# Patient Record
Sex: Male | Born: 1945
Health system: Southern US, Community
[De-identification: ages and names within clinical notes are randomized; demographics above are authoritative.]

## PROBLEM LIST (undated history)

## (undated) DIAGNOSIS — I499 Cardiac arrhythmia, unspecified: Secondary | ICD-10-CM

## (undated) DIAGNOSIS — D699 Hemorrhagic condition, unspecified: Secondary | ICD-10-CM

## (undated) DIAGNOSIS — Z860101 Personal history of adenomatous and serrated colon polyps: Secondary | ICD-10-CM

## (undated) DIAGNOSIS — R0602 Shortness of breath: Secondary | ICD-10-CM

## (undated) DIAGNOSIS — N189 Chronic kidney disease, unspecified: Secondary | ICD-10-CM

## (undated) DIAGNOSIS — R519 Headache, unspecified: Secondary | ICD-10-CM

## (undated) DIAGNOSIS — G4730 Sleep apnea, unspecified: Secondary | ICD-10-CM

## (undated) DIAGNOSIS — Z86010 Personal history of colonic polyps: Secondary | ICD-10-CM

## (undated) DIAGNOSIS — M1990 Unspecified osteoarthritis, unspecified site: Secondary | ICD-10-CM

## (undated) DIAGNOSIS — I350 Nonrheumatic aortic (valve) stenosis: Secondary | ICD-10-CM

## (undated) DIAGNOSIS — J189 Pneumonia, unspecified organism: Secondary | ICD-10-CM

## (undated) DIAGNOSIS — D649 Anemia, unspecified: Secondary | ICD-10-CM

## (undated) DIAGNOSIS — Z8719 Personal history of other diseases of the digestive system: Secondary | ICD-10-CM

## (undated) DIAGNOSIS — I5032 Chronic diastolic (congestive) heart failure: Secondary | ICD-10-CM

## (undated) DIAGNOSIS — K449 Diaphragmatic hernia without obstruction or gangrene: Secondary | ICD-10-CM

## (undated) DIAGNOSIS — H9190 Unspecified hearing loss, unspecified ear: Secondary | ICD-10-CM

## (undated) DIAGNOSIS — K219 Gastro-esophageal reflux disease without esophagitis: Secondary | ICD-10-CM

## (undated) DIAGNOSIS — Z7901 Long term (current) use of anticoagulants: Secondary | ICD-10-CM

## (undated) DIAGNOSIS — I4819 Other persistent atrial fibrillation: Secondary | ICD-10-CM

## (undated) DIAGNOSIS — R51 Headache: Secondary | ICD-10-CM

## (undated) DIAGNOSIS — I519 Heart disease, unspecified: Secondary | ICD-10-CM

## (undated) DIAGNOSIS — I10 Essential (primary) hypertension: Secondary | ICD-10-CM

## (undated) HISTORY — PX: COLONOSCOPY: SHX174

## (undated) HISTORY — PX: VOLVULUS REDUCTION: SHX425

## (undated) HISTORY — DX: Essential (primary) hypertension: I10

## (undated) HISTORY — PX: ESOPHAGUS SURGERY: SHX626

## (undated) HISTORY — DX: Cardiac arrhythmia, unspecified: I49.9

## (undated) HISTORY — DX: Anemia, unspecified: D64.9

## (undated) HISTORY — PX: TRANSTHORACIC ECHOCARDIOGRAM: SHX275

## (undated) HISTORY — DX: Hemorrhagic condition, unspecified: D69.9

## (undated) HISTORY — PX: FOOT SURGERY: SHX648

## (undated) HISTORY — PX: OTHER SURGICAL HISTORY: SHX169

## (undated) HISTORY — PX: ABDOMINAL HERNIA REPAIR: SHX539

## (undated) HISTORY — DX: Heart disease, unspecified: I51.9

---

## 1998-08-08 ENCOUNTER — Emergency Department (HOSPITAL_COMMUNITY): Admission: EM | Admit: 1998-08-08 | Discharge: 1998-08-08 | Payer: Self-pay | Admitting: Emergency Medicine

## 1998-08-19 ENCOUNTER — Ambulatory Visit (HOSPITAL_COMMUNITY): Admission: RE | Admit: 1998-08-19 | Discharge: 1998-08-19 | Payer: Self-pay | Admitting: Gastroenterology

## 1998-08-19 ENCOUNTER — Encounter: Payer: Self-pay | Admitting: Gastroenterology

## 1998-11-12 ENCOUNTER — Ambulatory Visit (HOSPITAL_COMMUNITY): Admission: RE | Admit: 1998-11-12 | Discharge: 1998-11-12 | Payer: Self-pay | Admitting: Internal Medicine

## 1998-12-30 ENCOUNTER — Ambulatory Visit (HOSPITAL_COMMUNITY): Admission: RE | Admit: 1998-12-30 | Discharge: 1998-12-30 | Payer: Self-pay | Admitting: Internal Medicine

## 1999-06-13 ENCOUNTER — Emergency Department (HOSPITAL_COMMUNITY): Admission: EM | Admit: 1999-06-13 | Discharge: 1999-06-13 | Payer: Self-pay | Admitting: Emergency Medicine

## 1999-12-22 ENCOUNTER — Encounter: Payer: Self-pay | Admitting: Orthopedic Surgery

## 1999-12-22 ENCOUNTER — Ambulatory Visit (HOSPITAL_BASED_OUTPATIENT_CLINIC_OR_DEPARTMENT_OTHER): Admission: RE | Admit: 1999-12-22 | Discharge: 1999-12-22 | Payer: Self-pay | Admitting: Orthopedic Surgery

## 2001-01-21 ENCOUNTER — Other Ambulatory Visit: Admission: RE | Admit: 2001-01-21 | Discharge: 2001-01-21 | Payer: Self-pay | Admitting: General Surgery

## 2001-03-26 ENCOUNTER — Encounter (INDEPENDENT_AMBULATORY_CARE_PROVIDER_SITE_OTHER): Payer: Self-pay | Admitting: Specialist

## 2001-03-26 ENCOUNTER — Other Ambulatory Visit: Admission: RE | Admit: 2001-03-26 | Discharge: 2001-03-26 | Payer: Self-pay | Admitting: Gastroenterology

## 2001-12-07 ENCOUNTER — Emergency Department (HOSPITAL_COMMUNITY): Admission: EM | Admit: 2001-12-07 | Discharge: 2001-12-07 | Payer: Self-pay | Admitting: *Deleted

## 2001-12-07 ENCOUNTER — Encounter: Payer: Self-pay | Admitting: *Deleted

## 2001-12-15 ENCOUNTER — Inpatient Hospital Stay (HOSPITAL_COMMUNITY): Admission: EM | Admit: 2001-12-15 | Discharge: 2001-12-18 | Payer: Self-pay

## 2003-01-12 ENCOUNTER — Other Ambulatory Visit: Admission: RE | Admit: 2003-01-12 | Discharge: 2003-01-12 | Payer: Self-pay | Admitting: Dermatology

## 2004-11-16 ENCOUNTER — Ambulatory Visit: Payer: Self-pay | Admitting: Cardiology

## 2004-12-13 ENCOUNTER — Ambulatory Visit (HOSPITAL_COMMUNITY): Admission: RE | Admit: 2004-12-13 | Discharge: 2004-12-13 | Payer: Self-pay | Admitting: Family Medicine

## 2004-12-26 ENCOUNTER — Ambulatory Visit: Payer: Self-pay | Admitting: *Deleted

## 2005-02-10 ENCOUNTER — Ambulatory Visit: Payer: Self-pay | Admitting: Cardiology

## 2005-03-22 ENCOUNTER — Ambulatory Visit: Payer: Self-pay | Admitting: Cardiology

## 2005-04-26 ENCOUNTER — Ambulatory Visit: Payer: Self-pay | Admitting: *Deleted

## 2005-06-01 ENCOUNTER — Ambulatory Visit: Payer: Self-pay | Admitting: *Deleted

## 2005-07-05 ENCOUNTER — Ambulatory Visit: Payer: Self-pay | Admitting: *Deleted

## 2005-07-24 ENCOUNTER — Emergency Department (HOSPITAL_COMMUNITY): Admission: EM | Admit: 2005-07-24 | Discharge: 2005-07-25 | Payer: Self-pay | Admitting: Emergency Medicine

## 2005-08-14 ENCOUNTER — Ambulatory Visit: Payer: Self-pay | Admitting: *Deleted

## 2005-09-14 ENCOUNTER — Ambulatory Visit: Payer: Self-pay | Admitting: *Deleted

## 2005-11-10 ENCOUNTER — Ambulatory Visit: Payer: Self-pay | Admitting: Cardiology

## 2005-12-18 ENCOUNTER — Ambulatory Visit: Payer: Self-pay | Admitting: Internal Medicine

## 2006-01-03 ENCOUNTER — Ambulatory Visit: Payer: Self-pay

## 2006-01-03 ENCOUNTER — Encounter: Payer: Self-pay | Admitting: Cardiology

## 2006-02-16 ENCOUNTER — Ambulatory Visit: Payer: Self-pay | Admitting: Internal Medicine

## 2006-04-20 ENCOUNTER — Ambulatory Visit: Payer: Self-pay | Admitting: *Deleted

## 2006-07-12 ENCOUNTER — Ambulatory Visit: Payer: Self-pay | Admitting: Cardiology

## 2006-09-10 ENCOUNTER — Ambulatory Visit: Payer: Self-pay | Admitting: Internal Medicine

## 2006-11-29 ENCOUNTER — Ambulatory Visit: Payer: Self-pay | Admitting: Cardiology

## 2007-02-15 ENCOUNTER — Ambulatory Visit: Payer: Self-pay | Admitting: Cardiology

## 2007-04-22 ENCOUNTER — Ambulatory Visit: Payer: Self-pay | Admitting: Internal Medicine

## 2007-05-07 ENCOUNTER — Ambulatory Visit: Payer: Self-pay | Admitting: Cardiology

## 2007-05-10 ENCOUNTER — Ambulatory Visit: Payer: Self-pay | Admitting: Internal Medicine

## 2007-06-05 ENCOUNTER — Ambulatory Visit: Payer: Self-pay | Admitting: Cardiovascular Disease

## 2007-07-10 ENCOUNTER — Ambulatory Visit: Payer: Self-pay | Admitting: Cardiology

## 2007-08-12 ENCOUNTER — Ambulatory Visit: Payer: Self-pay | Admitting: Internal Medicine

## 2007-09-16 ENCOUNTER — Ambulatory Visit: Payer: Self-pay | Admitting: Internal Medicine

## 2007-10-17 DIAGNOSIS — J189 Pneumonia, unspecified organism: Secondary | ICD-10-CM

## 2007-10-17 HISTORY — DX: Pneumonia, unspecified organism: J18.9

## 2007-11-06 ENCOUNTER — Ambulatory Visit: Payer: Self-pay | Admitting: Cardiology

## 2007-12-11 ENCOUNTER — Ambulatory Visit: Payer: Self-pay | Admitting: Cardiovascular Disease

## 2008-01-10 ENCOUNTER — Ambulatory Visit: Payer: Self-pay | Admitting: Cardiology

## 2008-02-12 ENCOUNTER — Ambulatory Visit: Payer: Self-pay | Admitting: Cardiology

## 2008-03-16 ENCOUNTER — Ambulatory Visit: Payer: Self-pay | Admitting: Cardiology

## 2008-04-22 ENCOUNTER — Ambulatory Visit: Payer: Self-pay | Admitting: Internal Medicine

## 2008-04-23 ENCOUNTER — Ambulatory Visit (HOSPITAL_COMMUNITY): Admission: RE | Admit: 2008-04-23 | Discharge: 2008-04-23 | Payer: Self-pay | Admitting: Internal Medicine

## 2008-04-24 ENCOUNTER — Ambulatory Visit: Payer: Self-pay | Admitting: Cardiology

## 2008-04-27 ENCOUNTER — Ambulatory Visit: Payer: Self-pay | Admitting: Cardiology

## 2008-04-30 ENCOUNTER — Ambulatory Visit: Payer: Self-pay | Admitting: Cardiology

## 2008-05-27 ENCOUNTER — Ambulatory Visit: Payer: Self-pay | Admitting: Cardiology

## 2008-07-01 ENCOUNTER — Ambulatory Visit: Payer: Self-pay | Admitting: Internal Medicine

## 2008-07-20 ENCOUNTER — Ambulatory Visit: Payer: Self-pay | Admitting: Cardiology

## 2008-08-31 ENCOUNTER — Ambulatory Visit: Payer: Self-pay | Admitting: Cardiology

## 2008-10-01 ENCOUNTER — Ambulatory Visit: Payer: Self-pay | Admitting: Cardiology

## 2008-11-09 ENCOUNTER — Ambulatory Visit: Payer: Self-pay | Admitting: Cardiology

## 2008-12-10 ENCOUNTER — Ambulatory Visit: Payer: Self-pay | Admitting: Cardiology

## 2009-01-11 ENCOUNTER — Ambulatory Visit: Payer: Self-pay | Admitting: Cardiology

## 2009-02-01 ENCOUNTER — Encounter (INDEPENDENT_AMBULATORY_CARE_PROVIDER_SITE_OTHER): Payer: Self-pay | Admitting: *Deleted

## 2009-02-25 ENCOUNTER — Encounter: Payer: Self-pay | Admitting: Cardiology

## 2009-03-01 ENCOUNTER — Ambulatory Visit: Payer: Self-pay | Admitting: Cardiology

## 2009-04-01 ENCOUNTER — Ambulatory Visit: Payer: Self-pay | Admitting: Cardiology

## 2009-05-06 ENCOUNTER — Ambulatory Visit: Payer: Self-pay | Admitting: Cardiology

## 2009-05-31 ENCOUNTER — Encounter: Payer: Self-pay | Admitting: *Deleted

## 2009-06-14 DIAGNOSIS — K222 Esophageal obstruction: Secondary | ICD-10-CM | POA: Insufficient documentation

## 2009-06-14 DIAGNOSIS — I10 Essential (primary) hypertension: Secondary | ICD-10-CM | POA: Insufficient documentation

## 2009-06-14 DIAGNOSIS — R42 Dizziness and giddiness: Secondary | ICD-10-CM | POA: Insufficient documentation

## 2009-07-05 ENCOUNTER — Ambulatory Visit: Payer: Self-pay | Admitting: Cardiology

## 2009-07-05 LAB — CONVERTED CEMR LAB: POC INR: 1.5

## 2009-07-22 ENCOUNTER — Ambulatory Visit: Payer: Self-pay | Admitting: Cardiology

## 2009-07-22 LAB — CONVERTED CEMR LAB: POC INR: 2.1

## 2009-08-19 ENCOUNTER — Ambulatory Visit: Payer: Self-pay | Admitting: Cardiology

## 2009-08-19 LAB — CONVERTED CEMR LAB: POC INR: 2.2

## 2009-09-01 ENCOUNTER — Ambulatory Visit: Payer: Self-pay | Admitting: Internal Medicine

## 2009-09-01 DIAGNOSIS — I4891 Unspecified atrial fibrillation: Secondary | ICD-10-CM | POA: Insufficient documentation

## 2009-09-27 ENCOUNTER — Ambulatory Visit: Payer: Self-pay | Admitting: Cardiology

## 2009-09-27 LAB — CONVERTED CEMR LAB: POC INR: 2.1

## 2009-10-28 ENCOUNTER — Telehealth: Payer: Self-pay | Admitting: Gastroenterology

## 2009-10-28 ENCOUNTER — Ambulatory Visit: Payer: Self-pay | Admitting: Cardiovascular Disease

## 2009-10-28 LAB — CONVERTED CEMR LAB: POC INR: 2

## 2009-11-29 ENCOUNTER — Ambulatory Visit: Payer: Self-pay | Admitting: Cardiology

## 2009-12-30 ENCOUNTER — Ambulatory Visit: Payer: Self-pay | Admitting: Cardiology

## 2009-12-30 LAB — CONVERTED CEMR LAB: POC INR: 2.9

## 2010-01-31 ENCOUNTER — Ambulatory Visit: Payer: Self-pay | Admitting: Cardiovascular Disease

## 2010-01-31 LAB — CONVERTED CEMR LAB: POC INR: 2.9

## 2010-03-03 ENCOUNTER — Ambulatory Visit: Payer: Self-pay | Admitting: Cardiology

## 2010-03-03 LAB — CONVERTED CEMR LAB: POC INR: 2.8

## 2010-03-18 ENCOUNTER — Ambulatory Visit: Payer: Self-pay | Admitting: Internal Medicine

## 2010-04-05 ENCOUNTER — Encounter (INDEPENDENT_AMBULATORY_CARE_PROVIDER_SITE_OTHER): Payer: Self-pay | Admitting: *Deleted

## 2010-04-14 ENCOUNTER — Encounter (INDEPENDENT_AMBULATORY_CARE_PROVIDER_SITE_OTHER): Payer: Self-pay | Admitting: Pharmacist

## 2010-04-27 ENCOUNTER — Ambulatory Visit (HOSPITAL_COMMUNITY)
Admission: RE | Admit: 2010-04-27 | Discharge: 2010-04-29 | Payer: Self-pay | Source: Home / Self Care | Admitting: Orthopedic Surgery

## 2010-05-11 ENCOUNTER — Ambulatory Visit: Payer: Self-pay | Admitting: Cardiology

## 2010-06-08 ENCOUNTER — Ambulatory Visit: Payer: Self-pay | Admitting: Cardiology

## 2010-06-16 ENCOUNTER — Ambulatory Visit: Payer: Self-pay | Admitting: Cardiology

## 2010-07-11 ENCOUNTER — Ambulatory Visit: Payer: Self-pay | Admitting: Cardiology

## 2010-08-08 ENCOUNTER — Ambulatory Visit: Payer: Self-pay | Admitting: Cardiology

## 2010-09-05 ENCOUNTER — Ambulatory Visit: Payer: Self-pay | Admitting: Cardiology

## 2010-09-05 LAB — CONVERTED CEMR LAB: POC INR: 2.9

## 2010-10-03 ENCOUNTER — Ambulatory Visit: Payer: Self-pay | Admitting: Cardiology

## 2010-10-20 ENCOUNTER — Ambulatory Visit
Admission: RE | Admit: 2010-10-20 | Discharge: 2010-10-20 | Payer: Self-pay | Source: Home / Self Care | Attending: Cardiology | Admitting: Cardiology

## 2010-10-20 DIAGNOSIS — R0602 Shortness of breath: Secondary | ICD-10-CM | POA: Insufficient documentation

## 2010-10-21 ENCOUNTER — Encounter: Payer: Self-pay | Admitting: Adult Health

## 2010-10-21 ENCOUNTER — Ambulatory Visit (HOSPITAL_COMMUNITY)
Admission: RE | Admit: 2010-10-21 | Discharge: 2010-10-21 | Payer: Self-pay | Source: Home / Self Care | Attending: Cardiology | Admitting: Cardiology

## 2010-10-21 ENCOUNTER — Encounter: Payer: Self-pay | Admitting: Cardiology

## 2010-10-21 LAB — CONVERTED CEMR LAB
BUN: 12 mg/dL (ref 6–23)
Calcium: 9 mg/dL (ref 8.4–10.5)
Chloride: 106 meq/L (ref 96–112)
Creatinine, Ser: 0.79 mg/dL (ref 0.40–1.50)

## 2010-10-24 ENCOUNTER — Encounter (INDEPENDENT_AMBULATORY_CARE_PROVIDER_SITE_OTHER): Payer: Self-pay | Admitting: *Deleted

## 2010-10-27 ENCOUNTER — Encounter: Payer: Self-pay | Admitting: Adult Health

## 2010-11-02 ENCOUNTER — Encounter: Payer: Self-pay | Admitting: Cardiology

## 2010-11-03 ENCOUNTER — Ambulatory Visit: Admission: RE | Admit: 2010-11-03 | Discharge: 2010-11-03 | Payer: Self-pay | Source: Home / Self Care

## 2010-11-15 NOTE — Letter (Signed)
Summary: Appointment - Missed  [facility] [place]  [address] [place] [address]   Phone: [phone]  Fax: [phone]     April 05, 2010 MRN: [id number]   Brent Mason [address] [place], [place]  [address]   Dear Brent Mason,  Our records indicate you missed your appointment on          04/05/10              with Dr.       .        Ladona Ridgel                            It is very important that we reach you to reschedule this appointment. We look forward to participating in your health care needs. Please contact us at the number listed above at your earliest convenience to reschedule this appointment.     Sincerely,    Glass blower/designer

## 2010-11-15 NOTE — Medication Information (Signed)
Summary: ccr-lr  Anticoagulant Therapy  Managed by: Vashti Hey, RN PCP: Dr.Fusco Supervising MD: Diona Browner MD, Remi Deter Indication 1: Atrial Fibrillation (ICD-427.31) Lab Used: [facility] [place] Site: [place] INR POC 4.4  Dietary changes: no    Health status changes: no    Bleeding/hemorrhagic complications: no    Recent/future hospitalizations: no    Any changes in medication regimen? yes       Details: Has been on Doxycycline 100mg  BID since July.  Has 25 days left  Recent/future dental: no  Any missed doses?: no       Is patient compliant with meds? yes       Allergies: No Known Drug Allergies  Anticoagulation Management History:      The patient is taking warfarin and comes in today for a routine follow up visit.  Negative risk factors for bleeding include an age less than 28 years old.  The bleeding index is 'low risk'.  Positive CHADS2 values include History of HTN.  Negative CHADS2 values include Age > 22 years old.  The start date was 09/15/1998.  Anticoagulation responsible provider: Diona Browner MD, Remi Deter.  INR POC: 4.4.  Cuvette Lot#: [id number].  Exp: 10/11.    Anticoagulation Management Assessment/Plan:      The patient's current anticoagulation dose is Coumadin 5 mg tabs: as directed.  The target INR is 2 - 3.  The next INR is due 06/16/2010.  Anticoagulation instructions were given to patient.  Results were reviewed/authorized by Vashti Hey, RN.  He was notified by Vashti Hey RN.         Prior Anticoagulation Instructions: INR 2.8 Continue coumadin 5mg  once daily except 7.5mg  on Tuesdays and Thursdays  Current Anticoagulation Instructions: INR 4.4 Is on Doxycycline 100mg  two times a day   Has 25 days left Hold coumadin Thursday and Friday then decrease dose to 5mg  once daily and recheck INR on 06/16/10

## 2010-11-15 NOTE — Medication Information (Signed)
Summary: ccr-lr  Anticoagulant Therapy  Managed by: Vashti Hey, RN PCP: Dr.Fusco Supervising MD: Dietrich Pates MD, Molly Maduro Indication 1: Atrial Fibrillation (ICD-427.31) Lab Used: [facility] [place] Site: [place] INR POC 2.9  Dietary changes: no    Health status changes: no    Bleeding/hemorrhagic complications: no    Recent/future hospitalizations: no    Any changes in medication regimen? no    Recent/future dental: no  Any missed doses?: no       Is patient compliant with meds? yes       Allergies: No Known Drug Allergies  Anticoagulation Management History:      The patient is taking warfarin and comes in today for a routine follow up visit.  Positive risk factors for bleeding include an age of 66 years or older.  The bleeding index is 'intermediate risk'.  Positive CHADS2 values include History of HTN.  Negative CHADS2 values include Age > 61 years old.  The start date was 09/15/1998.  Anticoagulation responsible Cayson Kalb: Dietrich Pates MD, Molly Maduro.  INR POC: 2.9.  Cuvette Lot#: [id number].  Exp: 10/11.    Anticoagulation Management Assessment/Plan:      The patient's current anticoagulation dose is Coumadin 5 mg tabs: as directed.  The target INR is 2 - 3.  The next INR is due 10/03/2010.  Anticoagulation instructions were given to patient.  Results were reviewed/authorized by Vashti Hey, RN.  He was notified by Vashti Hey RN.         Prior Anticoagulation Instructions: INR 3.5 Decrease dose to 5mg  once daily   Current Anticoagulation Instructions: INR 2.9 Continue coumadin 5mg  once daily

## 2010-11-15 NOTE — Medication Information (Signed)
Summary: ccr-lr  Anticoagulant Therapy  Managed by: Vashti Hey, RN PCP: Dr.Fusco Supervising MD: Dietrich Pates MD, Molly Maduro Indication 1: Atrial Fibrillation (ICD-427.31) Lab Used: [facility] [place] Site: [place] INR POC 3.8  Dietary changes: no    Health status changes: no    Bleeding/hemorrhagic complications: no    Recent/future hospitalizations: no    Any changes in medication regimen? no    Recent/future dental: no  Any missed doses?: no       Is patient compliant with meds? yes       Allergies: No Known Drug Allergies  Anticoagulation Management History:      The patient is taking warfarin and comes in today for a routine follow up visit.  Positive risk factors for bleeding include an age of 66 years or older.  The bleeding index is 'intermediate risk'.  Positive CHADS2 values include History of HTN.  Negative CHADS2 values include Age > 58 years old.  The start date was 09/15/1998.  Anticoagulation responsible provider: Dietrich Pates MD, Molly Maduro.  INR POC: 3.8.  Cuvette Lot#: [id number].  Exp: 10/11.    Anticoagulation Management Assessment/Plan:      The patient's current anticoagulation dose is Coumadin 5 mg tabs: as directed.  The target INR is 2 - 3.  The next INR is due 08/08/2010.  Anticoagulation instructions were given to patient.  Results were reviewed/authorized by Vashti Hey, RN.  He was notified by Vashti Hey RN.         Prior Anticoagulation Instructions: INR 3.3 Decrease coumadin to 5mg  once daily except 2.5mg  on Fridays until you finish Doxycycline then resume 5mg  once daily except 7.5mg  on Tuesdays and Thursdays.  Current Anticoagulation Instructions: INR 3.8 Hold coumadin tomorrow then decrease dose to 5mg  once daily except 7.5mg  on Thursdays Pt requested 1 month

## 2010-11-15 NOTE — Medication Information (Signed)
Summary: ccr-lr  Anticoagulant Therapy  Managed by: Vashti Hey, RN PCP: Dr.Fusco Supervising MD: Dietrich Pates MD, Molly Maduro Indication 1: Atrial Fibrillation (ICD-427.31) Lab Used: [facility] [place] Site: [place] INR POC 2.8  Dietary changes: no    Health status changes: no    Bleeding/hemorrhagic complications: no    Recent/future hospitalizations: no    Any changes in medication regimen? no    Recent/future dental: no  Any missed doses?: no       Is patient compliant with meds? yes       Allergies: No Known Drug Allergies  Anticoagulation Management History:      The patient is taking warfarin and comes in today for a routine follow up visit.  Negative risk factors for bleeding include an age less than 18 years old.  The bleeding index is 'low risk'.  Positive CHADS2 values include History of HTN.  Negative CHADS2 values include Age > 24 years old.  The start date was 09/15/1998.  Anticoagulation responsible provider: Dietrich Pates MD, Molly Maduro.  INR POC: 2.8.  Cuvette Lot#: [id number].  Exp: 10/11.    Anticoagulation Management Assessment/Plan:      The patient's current anticoagulation dose is Coumadin 5 mg tabs: as directed.  The target INR is 2 - 3.  The next INR is due 04/04/2010.  Anticoagulation instructions were given to patient.  Results were reviewed/authorized by Vashti Hey, RN.  He was notified by Vashti Hey RN.         Prior Anticoagulation Instructions: INR 2.9 Continue coumadin 5mg  once daily except 7.5mg  on Tuesdays and Thursdays  Current Anticoagulation Instructions: INR 2.8 Continue coumadin 5mg  once daily except 7.5mg  on Tuesdays and Thursdays

## 2010-11-15 NOTE — Medication Information (Signed)
Summary: ccr-lr  Anticoagulant Therapy  Managed by: Vashti Hey, RN PCP: Dr.Fusco Supervising MD: Eden Emms MD, Theron Arista Indication 1: Atrial Fibrillation (ICD-427.31) Lab Used: [facility]  Site: [place] INR POC 2.0  Dietary changes: no    Health status changes: no    Bleeding/hemorrhagic complications: no    Recent/future hospitalizations: no    Any changes in medication regimen? no    Recent/future dental: no  Any missed doses?: no       Is patient compliant with meds? yes       Allergies: No Known Drug Allergies  Anticoagulation Management History:      The patient is taking warfarin and comes in today for a routine follow up visit.  Negative risk factors for bleeding include an age less than 38 years old.  The bleeding index is 'low risk'.  Positive CHADS2 values include History of HTN.  Negative CHADS2 values include Age > 87 years old.  The start date was 09/15/1998.  Anticoagulation responsible provider: Eden Emms MD, Theron Arista.  INR POC: 2.0.  Cuvette Lot#: [id number].  Exp: 10/11.    Anticoagulation Management Assessment/Plan:      The patient's current anticoagulation dose is Coumadin 5 mg tabs: as directed.  The target INR is 2 - 3.  The next INR is due 11/25/2009.  Anticoagulation instructions were given to patient.  Results were reviewed/authorized by Vashti Hey, RN.  He was notified by Vashti Hey RN.         Prior Anticoagulation Instructions: INR 2.1 Continue coumadin 5mg  once daily except 2.5mg  on Tuesdays  Current Anticoagulation Instructions: INR 2.0 Increase coumadin to 5mg  once daily

## 2010-11-15 NOTE — Letter (Signed)
Summary: Custom - Delinquent Coumadin 1  [facility] [place]  [address], [place] [address]   Phone: [phone]  Fax: [phone]     April 14, 2010 MRN: [id number]   [name] [address] [place], [place]  [address]   Dear [name],  This letter is being sent to you as a reminder that it is necessary for you to get your INR/PT checked regularly so that we can optimize your care.  Our records indicate that you were scheduled to have a test done recently.  As of today, we have not received the results of this test.  It is very important that you have your INR checked.  Please call our office at the number listed above to schedule an appointment at your earliest convenience.    If you have recently had your protime checked or have discontinued this medication, please contact our office at the above phone number to clarify this issue.  Thank you for this prompt attention to this important health care matter.  Sincerely, Vashti Hey RN  [facility] Team

## 2010-11-15 NOTE — Medication Information (Signed)
Summary: ccr-lr  Anticoagulant Therapy  Managed by: Vashti Hey, RN PCP: Dr.Fusco Supervising MD: Diona Browner MD, Remi Deter Indication 1: Atrial Fibrillation (ICD-427.31) Lab Used: [facility] [place] Site: [place] INR POC 3.3  Dietary changes: no    Health status changes: no    Bleeding/hemorrhagic complications: no    Recent/future hospitalizations: no    Any changes in medication regimen? no    Recent/future dental: no  Any missed doses?: no       Is patient compliant with meds? yes       Allergies: No Known Drug Allergies  Anticoagulation Management History:      The patient is taking warfarin and comes in today for a routine follow up visit.  Negative risk factors for bleeding include an age less than 76 years old.  The bleeding index is 'low risk'.  Positive CHADS2 values include History of HTN.  Negative CHADS2 values include Age > 32 years old.  The start date was 09/15/1998.  Anticoagulation responsible provider: Diona Browner MD, Remi Deter.  INR POC: 3.3.  Cuvette Lot#: [id number].  Exp: 10/11.    Anticoagulation Management Assessment/Plan:      The patient's current anticoagulation dose is Coumadin 5 mg tabs: as directed.  The target INR is 2 - 3.  The next INR is due 07/11/2010.  Anticoagulation instructions were given to patient.  Results were reviewed/authorized by Vashti Hey, RN.  He was notified by Vashti Hey RN.         Prior Anticoagulation Instructions: INR 4.4 Is on Doxycycline 100mg  two times a day   Has 25 days left Hold coumadin Thursday and Friday then decrease dose to 5mg  once daily and recheck INR on 06/16/10  Current Anticoagulation Instructions: INR 3.3 Decrease coumadin to 5mg  once daily except 2.5mg  on Fridays until you finish Doxycycline then resume 5mg  once daily except 7.5mg  on Tuesdays and Thursdays.

## 2010-11-15 NOTE — Progress Notes (Signed)
Summary: Change GI Dr/No need to Schedule Colon   Phone Note Outgoing Call   Call placed by: Hortense Ramal CMA Duncan Dull),  October 28, 2009 11:47 AM Call placed to: Patient Summary of Call: I have called patient to advise him that he needs to schedule his recall colonoscopy. Patient states that he had a colonoscopy done in [place] last year and is seeing a GI Dr there. Initial call taken by: Hortense Ramal CMA Duncan Dull),  October 28, 2009 11:48 AM

## 2010-11-15 NOTE — Medication Information (Signed)
Summary: ccr-lr  Anticoagulant Therapy  Managed by: Vashti Hey, RN PCP: Dr.Fusco Supervising MD: Dietrich Pates MD, Molly Maduro Indication 1: Atrial Fibrillation (ICD-427.31) Lab Used: [facility] [place] Site: [place] INR POC 1.8  Dietary changes: no    Health status changes: no    Bleeding/hemorrhagic complications: no    Recent/future hospitalizations: no    Any changes in medication regimen? no    Recent/future dental: no  Any missed doses?: no       Is patient compliant with meds? yes       Allergies: No Known Drug Allergies  Anticoagulation Management History:      The patient is taking warfarin and comes in today for a routine follow up visit.  Negative risk factors for bleeding include an age less than 15 years old.  The bleeding index is 'low risk'.  Positive CHADS2 values include History of HTN.  Negative CHADS2 values include Age > 63 years old.  The start date was 09/15/1998.  Anticoagulation responsible provider: Dietrich Pates MD, Molly Maduro.  INR POC: 1.8.  Cuvette Lot#: [id number].  Exp: 10/11.    Anticoagulation Management Assessment/Plan:      The patient's current anticoagulation dose is Coumadin 5 mg tabs: as directed.  The target INR is 2 - 3.  The next INR is due 12/30/2009.  Anticoagulation instructions were given to patient.  Results were reviewed/authorized by Vashti Hey, RN.  He was notified by Vashti Hey RN.         Prior Anticoagulation Instructions: INR 2.0 Increase coumadin to 5mg  once daily   Current Anticoagulation Instructions: INR 1.8 Increase coumadin to 5mg  once daily except 7.5mg  on Tuesdays and Thursdays. (Take 7.5mg  tonight instead of Tuesday night this week)

## 2010-11-15 NOTE — Medication Information (Signed)
Summary: ccr-lr  Anticoagulant Therapy  Managed by: Vashti Hey, RN PCP: Dr.Fusco Supervising MD: Daleen Squibb MD, Maisie Fus Indication 1: Atrial Fibrillation (ICD-427.31) Lab Used: [facility] [place] Site: [place] INR POC 3.5  Dietary changes: no    Health status changes: no    Bleeding/hemorrhagic complications: no    Recent/future hospitalizations: no    Any changes in medication regimen? no    Recent/future dental: no  Any missed doses?: no       Is patient compliant with meds? yes       Allergies: No Known Drug Allergies  Anticoagulation Management History:      The patient is taking warfarin and comes in today for a routine follow up visit.  Positive risk factors for bleeding include an age of 66 years or older.  The bleeding index is 'intermediate risk'.  Positive CHADS2 values include History of HTN.  Negative CHADS2 values include Age > 26 years old.  The start date was 09/15/1998.  Anticoagulation responsible provider: Daleen Squibb MD, Maisie Fus.  INR POC: 3.5.  Cuvette Lot#: [id number].  Exp: 10/11.    Anticoagulation Management Assessment/Plan:      The patient's current anticoagulation dose is Coumadin 5 mg tabs: as directed.  The target INR is 2 - 3.  The next INR is due 09/05/2010.  Anticoagulation instructions were given to patient.  Results were reviewed/authorized by Vashti Hey, RN.  He was notified by Vashti Hey RN.         Prior Anticoagulation Instructions: INR 3.8 Hold coumadin tomorrow then decrease dose to 5mg  once daily except 7.5mg  on Thursdays Pt requested 1 month  Current Anticoagulation Instructions: INR 3.5 Decrease dose to 5mg  once daily

## 2010-11-15 NOTE — Medication Information (Signed)
Summary: protime/tg  Anticoagulant Therapy  Managed by: Vashti Hey, RN PCP: Dr.Fusco Supervising MD: Dietrich Pates MD, Molly Maduro Indication 1: Atrial Fibrillation (ICD-427.31) Lab Used: [facility] [place] Site:  INR POC 2.8  Dietary changes: no    Health status changes: no    Bleeding/hemorrhagic complications: no    Recent/future hospitalizations: yes       Details: In [facility] 7/13 for foot surgery  D/C 7/15 Resumed coumadin on 04/28/10  Any changes in medication regimen? no    Recent/future dental: no  Any missed doses?: yes     Details: Was off coumadin 6 days prior to procedure  Is patient compliant with meds? yes       Allergies: No Known Drug Allergies  Anticoagulation Management History:      The patient is taking warfarin and comes in today for a routine follow up visit.  Negative risk factors for bleeding include an age less than 57 years old.  The bleeding index is 'low risk'.  Positive CHADS2 values include History of HTN.  Negative CHADS2 values include Age > 56 years old.  The start date was 09/15/1998.  Anticoagulation responsible provider: Dietrich Pates MD, Molly Maduro.  INR POC: 2.8.  Cuvette Lot#: [id number].  Exp: 10/11.    Anticoagulation Management Assessment/Plan:      The patient's current anticoagulation dose is Coumadin 5 mg tabs: as directed.  The target INR is 2 - 3.  The next INR is due 06/08/2010.  Anticoagulation instructions were given to patient.  Results were reviewed/authorized by Vashti Hey, RN.  He was notified by Vashti Hey RN.         Prior Anticoagulation Instructions: INR 2.8 Continue coumadin 5mg  once daily except 7.5mg  on Tuesdays and Thursdays  Current Anticoagulation Instructions: Same as Prior Instructions.

## 2010-11-15 NOTE — Medication Information (Signed)
Summary: ccr-lr  Anticoagulant Therapy  Managed by: Vashti Hey, RN PCP: Dr.Fusco Supervising MD: Dietrich Pates MD, Molly Maduro Indication 1: Atrial Fibrillation (ICD-427.31) Lab Used: [facility] [place] Site:  INR POC 2.9  Dietary changes: no    Health status changes: no    Bleeding/hemorrhagic complications: no    Recent/future hospitalizations: no    Any changes in medication regimen? no    Recent/future dental: no  Any missed doses?: no       Is patient compliant with meds? yes       Allergies: No Known Drug Allergies  Anticoagulation Management History:      The patient is taking warfarin and comes in today for a routine follow up visit.  Negative risk factors for bleeding include an age less than 34 years old.  The bleeding index is 'low risk'.  Positive CHADS2 values include History of HTN.  Negative CHADS2 values include Age > 45 years old.  The start date was 09/15/1998.  Anticoagulation responsible provider: Dietrich Pates MD, Molly Maduro.  INR POC: 2.9.  Cuvette Lot#: [id number].  Exp: 10/11.    Anticoagulation Management Assessment/Plan:      The patient's current anticoagulation dose is Coumadin 5 mg tabs: as directed.  The target INR is 2 - 3.  The next INR is due 01/31/2010.  Anticoagulation instructions were given to patient.  Results were reviewed/authorized by Vashti Hey, RN.  He was notified by Vashti Hey RN.         Prior Anticoagulation Instructions: INR 1.8 Increase coumadin to 5mg  once daily except 7.5mg  on Tuesdays and Thursdays. (Take 7.5mg  tonight instead of Tuesday night this week)  Current Anticoagulation Instructions: INR 2.9 Continue coumadin 5mg  once daily except 7.5mg  on Tuesdays and Thursdays

## 2010-11-15 NOTE — Medication Information (Signed)
Summary: ccr-lr  Anticoagulant Therapy  Managed by: Vashti Hey, RN PCP: Dr.Fusco Supervising MD: Eden Emms MD, Theron Arista Indication 1: Atrial Fibrillation (ICD-427.31) Lab Used: [facility] [place] Site:  INR POC 2.9  Dietary changes: no    Health status changes: no    Bleeding/hemorrhagic complications: no    Recent/future hospitalizations: no    Any changes in medication regimen? no    Recent/future dental: no  Any missed doses?: no       Is patient compliant with meds? yes       Allergies: No Known Drug Allergies  Anticoagulation Management History:      The patient is taking warfarin and comes in today for a routine follow up visit.  Negative risk factors for bleeding include an age less than 65 years old.  The bleeding index is 'low risk'.  Positive CHADS2 values include History of HTN.  Negative CHADS2 values include Age > 37 years old.  The start date was 09/15/1998.  Anticoagulation responsible provider: Eden Emms MD, Theron Arista.  INR POC: 2.9.  Cuvette Lot#: [id number].  Exp: 10/11.    Anticoagulation Management Assessment/Plan:      The patient's current anticoagulation dose is Coumadin 5 mg tabs: as directed.  The target INR is 2 - 3.  The next INR is due 03/03/2010.  Anticoagulation instructions were given to patient.  Results were reviewed/authorized by Vashti Hey, RN.  He was notified by Vashti Hey RN.         Prior Anticoagulation Instructions: INR 2.9 Continue coumadin 5mg  once daily except 7.5mg  on Tuesdays and Thursdays  Current Anticoagulation Instructions: Same as Prior Instructions.

## 2010-11-15 NOTE — Assessment & Plan Note (Signed)
Summary: rov  Medications Added [facility] IRON 325 (65 FE) MG TABS (FERROUS SULFATE) once daily NAPROSYN 500 MG TABS (NAPROXEN) as needed      Allergies Added: NKDA  Visit Type:  Follow-up Primary Provider:  Dr.Fusco   History of Present Illness: Brent Mason returns today for followup of his atrial fibrillation and HTN.  He is a pleasant middle aged man with obesity, sleep apnea, atrial fibrillation (chronic) and HTN.  He has done well in the past year.  He denies c/p, sob, or peripheral edema.  He continues to work 40 hrs. a week.  No syncope. He has been unable to lose weight.  He notes that he is pending ankle surgery.  No other complaints.  Current Medications (verified): 1)  Protonix 40 Mg Tbec (Pantoprazole Sodium) .... Take 1 Tab Daily 2)  Coumadin 5 Mg Tabs (Warfarin Sodium) .... As Directed 3)  Captopril 12.5 Mg Tabs (Captopril) .... Take 1 Tab Two Times A Day 4)  [facility] Iron 325 (65 Fe) Mg Tabs (Ferrous Sulfate) .... Once Daily 5)  Naprosyn 500 Mg Tabs (Naproxen) .... As Needed  Allergies (verified): No Known Drug Allergies  Past History:  Past Medical History: Last updated: 06/14/2009 Current Problems:  DIZZINESS (ICD-780.4) ESOPHAGEAL STRICTURE (ICD-530.3) HYPERTENSION (ICD-401.9)  Past Surgical History: Last updated: 06/14/2009 history of volvulus status post surgery 1998 and 1999  Review of Systems  The patient denies chest pain, syncope, dyspnea on exertion, and peripheral edema.    Vital Signs:  Patient profile:   65 year old male Height:      72 inches Weight:      290 pounds BMI:     39.47 Pulse rate:   73 / minute BP sitting:   132 / 88  (left arm)  Vitals Entered By: Brent Mason CMA (March 18, 2010 4:02 PM)  Physical Exam  General:  Obese, middle aged well developed, well nourished, in no acute distress.  HEENT: normal Neck: supple. No JVD. Carotids 2+ bilaterally no bruits Cor:I RIRR no rubs, gallops or murmur Lungs: CTA Ab: soft, nontender.  nondistended. No HSM. Good bowel sounds Ext: warm. no cyanosis, clubbing or edema. right foot in a boot. Neuro: alert and oriented. Grossly nonfocal. affect pleasant    EKG  Procedure date:  03/18/2010  Findings:      Atrial fibrillation with a controlled ventricular response rate of: 73.  Impression & Recommendations:  Problem # 1:  ATRIAL FIBRILLATION (ICD-427.31) His ventricular rate appears to  be well controlled without AV nodal blocking drugs.  Continue current meds. His updated medication list for this problem includes:    Coumadin 5 Mg Tabs (Warfarin sodium) .Marland Kitchen... As directed  Problem # 2:  HYPERTENSION (ICD-401.9) His blood pressure is well controlled.  A low sodium diet is requested. His updated medication list for this problem includes:    Captopril 12.5 Mg Tabs (Captopril) .Marland Kitchen... Take 1 tab two times a day  Problem # 3:  MORBID OBESITY (ICD-278.01) A low fat diet is recommended.  Problem # 4:  PREOPERATIVE EXAMINATION (ICD-V72.84) The patient is at low risk for major cardiovascular complications from pending foot surgery.  Coumadin may be stopped 5 days prior to surgery and restarted on post op day 1.  Patient Instructions: 1)  Your physician recommends that you schedule a follow-up appointment in: 12 months with Dr Ladona Ridgel 2)  Your physician recommends that you continue on your current medications as directed. Please refer to the Current Medication list given  to you today.

## 2010-11-17 NOTE — Letter (Signed)
Summary: [place] Results Engineer, agricultural at [facility]  [address] [place] [address]   Phone: [phone]  Fax: [phone]      October 24, 2010 MRN: [id number]   GOEBEL HELLUMS [address] [place], [place]  [address]   Dear Mr. PITERA,  Your test ordered by Selena Batten has been reviewed by your physician (or physician assistant) and was found to be normal or stable. Your physician (or physician assistant) felt no changes were needed at this time.  ____ Echocardiogram  ____ Cardiac Stress Test  __X__ Lab Work  ____ Peripheral vascular study of arms, legs or neck  ____ CT scan or X-ray  ____ Lung or Breathing test  ____ Other:  No change in medical treatment at this time, per Joni Reining NP.   Thank you, Rashaad Hallstrom Allyne Gee RN    [place] Bing, MD, Lenise Arena.C.Gaylord Shih, MD, F.A.C.C Lewayne Bunting, MD, F.A.C.C Nona Dell, MD, F.A.C.C Charlton Haws, MD, Lenise Arena.C.C

## 2010-11-17 NOTE — Medication Information (Signed)
Summary: ccr-lr  Anticoagulant Therapy  Managed by: Vashti Hey, RN PCP: Dr.Fusco Supervising MD: Dietrich Pates MD, Molly Maduro Indication 1: Atrial Fibrillation (ICD-427.31) Lab Used: [facility] [place] Site: [place] INR POC 2.6  Dietary changes: no    Health status changes: no    Bleeding/hemorrhagic complications: no    Recent/future hospitalizations: no    Any changes in medication regimen? no    Recent/future dental: no  Any missed doses?: no       Is patient compliant with meds? yes       Allergies: No Known Drug Allergies  Anticoagulation Management History:      The patient is taking warfarin and comes in today for a routine follow up visit.  Positive risk factors for bleeding include an age of 66 years or older.  The bleeding index is 'intermediate risk'.  Positive CHADS2 values include History of HTN.  Negative CHADS2 values include Age > 33 years old.  The start date was 09/15/1998.  Anticoagulation responsible provider: Dietrich Pates MD, Molly Maduro.  INR POC: 2.6.  Cuvette Lot#: [id number].  Exp: 10/11.    Anticoagulation Management Assessment/Plan:      The patient's current anticoagulation dose is Coumadin 5 mg tabs: as directed.  The target INR is 2 - 3.  The next INR is due 11/03/2010.  Anticoagulation instructions were given to patient.  Results were reviewed/authorized by Vashti Hey, RN.  He was notified by Vashti Hey RN.         Prior Anticoagulation Instructions: INR 2.9 Continue coumadin 5mg  once daily   Current Anticoagulation Instructions: INR 2.6 Continue coumadin 5mg  once daily

## 2010-11-17 NOTE — Letter (Signed)
Summary: [facility]   Imported By: Faythe Ghee 10/27/2010 10:46:18  _____________________________________________________________________  External Attachment:    Type:   Image     Comment:   External Document

## 2010-11-17 NOTE — Medication Information (Signed)
Summary: ccr-lr  Anticoagulant Therapy  Managed by: Vashti Hey, RN PCP: Dr.Fusco Supervising MD: Dietrich Pates MD, Molly Maduro Indication 1: Atrial Fibrillation (ICD-427.31) Lab Used: [facility] [place] Site: [place] INR POC 3.0  Dietary changes: no    Health status changes: no    Bleeding/hemorrhagic complications: no    Recent/future hospitalizations: no    Any changes in medication regimen? no    Recent/future dental: no  Any missed doses?: no       Is patient compliant with meds? yes       Allergies: No Known Drug Allergies  Anticoagulation Management History:      The patient is taking warfarin and comes in today for a routine follow up visit.  Positive risk factors for bleeding include an age of 25 years or older.  The bleeding index is 'intermediate risk'.  Positive CHADS2 values include History of HTN.  Negative CHADS2 values include Age > 6 years old.  The start date was 09/15/1998.  Anticoagulation responsible provider: Dietrich Pates MD, Molly Maduro.  INR POC: 3.0.  Cuvette Lot#: [id number].  Exp: 10/11.    Anticoagulation Management Assessment/Plan:      The patient's current anticoagulation dose is Coumadin 5 mg tabs: as directed.  The target INR is 2 - 3.  The next INR is due 12/01/2010.  Anticoagulation instructions were given to patient.  Results were reviewed/authorized by Vashti Hey, RN.  He was notified by Vashti Hey RN.         Prior Anticoagulation Instructions: INR 2.6 Continue coumadin 5mg  once daily   Current Anticoagulation Instructions: INR 3.0 Continue coumadin 5mg  once daily

## 2010-11-17 NOTE — Assessment & Plan Note (Signed)
Summary: pt having problems w/SOB per pt's daughter/tg  Medications Added PROTONIX 40 MG TBEC (PANTOPRAZOLE SODIUM) take 1 tab two times a day HYDROCHLOROTHIAZIDE 25 MG TABS (HYDROCHLOROTHIAZIDE) take 1/2 tablet by mouth once daily      Allergies Added: NKDA  Visit Type:  Follow-up Primary Provider:  Dr.Fusco   History of Present Illness: Brent Mason comes today as an add-on with complaints of dyspnea.  She has a history of atrial fib and hypertension.  He states that he has had dyspnea when he bends over to adjust a brace that is on his right leg from recent surgery to repair injury to right ankle.  He says when he bends forward he loses his breath and this lasts for several minutes afterwards.  His family has become concerned and they wanted him to be seen.  He denies chest pain, dizziness or pre-syncope associated with this.  Preventive Screening-Counseling & Management  Alcohol-Tobacco     Alcohol drinks/day: 0     Smoking Status: quit  Current Medications (verified): 1)  Protonix 40 Mg Tbec (Pantoprazole Sodium) .... Take 1 Tab Two Times A Day 2)  Coumadin 5 Mg Tabs (Warfarin Sodium) .... As Directed 3)  Captopril 12.5 Mg Tabs (Captopril) .... Take 1 Tab Two Times A Day 4)  [facility] Iron 325 (65 Fe) Mg Tabs (Ferrous Sulfate) .... Once Daily 5)  Naprosyn 500 Mg Tabs (Naproxen) .... As Needed 6)  Hydrochlorothiazide 25 Mg Tabs (Hydrochlorothiazide) .... Take 1/2 Tablet By Mouth Once Daily  Allergies (verified): No Known Drug Allergies  Comments:  Nurse/Medical Assistant: [facility] [place] no meds no list  Past History:  Past medical, surgical, family and social histories (including risk factors) reviewed, and no changes noted (except as noted below).  Past Medical History: Reviewed history from 06/14/2009 and no changes required. Current Problems:  DIZZINESS (ICD-780.4) ESOPHAGEAL STRICTURE (ICD-530.3) HYPERTENSION (ICD-401.9)  Past Surgical  History: Reviewed history from 06/14/2009 and no changes required. history of volvulus status post surgery 1998 and 1999  Family History: Reviewed history from 06/14/2009 and no changes required. Father:alive age 39 and has diabetes Mother:alive has heart disease  also has a pacemaker  Social History: Reviewed history from 06/14/2009 and no changes required. Full Time Married  Tobacco Use - No.  Alcohol Use - no Regular Exercise - no Drug Use - no Alcohol drinks/day:  0 Smoking Status:  quit  Review of Systems       Dyspnea  All other systems have been reviewed and are negative unless stated above.   Vital Signs:  Patient profile:   65 year old male Weight:      293 pounds O2 Sat:      96 % on Room air Pulse rate:   54 / minute BP sitting:   158 / 98  (right arm)  Vitals Entered By: Dreama Saa, CNA (October 20, 2010 2:31 PM)  O2 Flow:  Room air  Physical Exam  General:  normal appearance and obese.   Head:  normocephalic and atraumatic Eyes:  PERRLA/EOM intact; conjunctiva and lids normal. Lungs:  Clear bilaterally to auscultation and percussion. Heart:  regularly irregular with occaisonal extra systole no MRG.  Pulses are palpable and there is no JVD or Carotid bruits. Abdomen:  Obese with ventral hernia in square shaped configuration after surgerical intervention.  Msk:  Back normal, normal gait. Muscle strength and tone normal. Pulses:  pulses normal in all 4 extremities Extremities:  trace left pedal edema  and trace right pedal edema.   Neurologic:  Alert and oriented x 3. Psych:  Normal affect.   EKG  Procedure date:  10/20/2010  Findings:      Atrial fibrillation with a controlled ventricular response rate of: 82  Impression & Recommendations:  Problem # 1:  DYSPNEA (ICD-786.05) I have had [name] do a 6 minute walk here in clinic with a rhythum strip and 02 saturation.  No change in 02 sat of 95%,  rhythum strip afib with  PVC;s.  I have  advised Brent Mason that he should lose weight (20 lbs)as abdominal obesity is pushing on diaphram causing his shortness of breath when he bends over.  He is to wait for a minute to catch his breath when he places his brace before getting up to walk.  I will order echo for LV fx as last one was completed in 2007, to make sure there are no changes which may be contributing to his breathing status. We will follow-up in a month or so to discuss results and monitor his progress. His updated medication list for this problem includes:    Captopril 12.5 Mg Tabs (Captopril) .Marland Kitchen... Take 1 tab two times a day    Hydrochlorothiazide 25 Mg Tabs (Hydrochlorothiazide) .Marland Kitchen... Take 1/2 tablet by mouth once daily  Orders: 2-D Echocardiogram (2D Echo)  Problem # 2:  ATRIAL FIBRILLATION (ICD-427.31) He is having frequemt extra systoles and I have ordered BMET to evaluate potassium status. Rate is well controlled at present. His updated medication list for this problem includes:    Coumadin 5 Mg Tabs (Warfarin sodium) .Marland Kitchen... As directed  Other Orders: T-Basic Metabolic Panel [phone])  Patient Instructions: 1)  Your physician recommends that you schedule a follow-up appointment in: 1 month 2)  Your physician recommends that you return for lab work in: today 3)  Your physician has recommended you make the following change in your medication: start taking Hydrocholorhiazide 12.5mg  by mouth once daily  4)  Your physician has requested that you have an echocardiogram.  Echocardiography is a painless test that uses sound waves to create images of your heart. It provides your doctor with information about the size and shape of your heart and how well your heart's chambers and valves are working.  This procedure takes approximately one hour. There are no restrictions for this procedure. 5)  Your physician recommends that you lower the amount of sodium in your diet, please see handout given to you  today. Prescriptions: HYDROCHLOROTHIAZIDE 25 MG TABS (HYDROCHLOROTHIAZIDE) take 1/2 tablet by mouth once daily  #16 x 3   Entered by:   Larita Fife Via LPN   Authorized by:   Joni Reining, NP   Signed by:   Larita Fife Via LPN on 09/81/1914   Method used:   [facility] to        [facility] [phone][facility] (retail)       [address]       [place], [place]  [address]       Ph: [phone]       Fax: [phone]   RxID:   [id number]

## 2010-11-17 NOTE — Letter (Signed)
Summary: [name], agricultural at [facility]  [address] [place] [address]   Phone: [phone]  Fax: [phone]      November 02, 2010 MRN: [id number]   Brent Mason [address] [place], [place]  [address]   Dear [name],  Your test ordered by Selena Batten has been reviewed by your physician (or physician assistant) and was found to be normal or stable. Your physician (or physician assistant) felt no changes were needed at this time.  __X__ Echocardiogram  ____ Cardiac Stress Test  ____ Lab Work  ____ Peripheral vascular study of arms, legs or neck  ____ CT scan or X-ray  ____ Lung or Breathing test  ____ Other:  Please continue on current medical trearment.  Thank you.   [place] Bing, MD, F.A.C.C

## 2010-11-21 ENCOUNTER — Ambulatory Visit (INDEPENDENT_AMBULATORY_CARE_PROVIDER_SITE_OTHER): Payer: Self-pay | Admitting: Adult Health

## 2010-11-21 ENCOUNTER — Encounter: Payer: Self-pay | Admitting: Adult Health

## 2010-11-21 DIAGNOSIS — I4891 Unspecified atrial fibrillation: Secondary | ICD-10-CM

## 2010-11-21 DIAGNOSIS — I10 Essential (primary) hypertension: Secondary | ICD-10-CM

## 2010-11-21 DIAGNOSIS — E785 Hyperlipidemia, unspecified: Secondary | ICD-10-CM | POA: Insufficient documentation

## 2010-11-28 ENCOUNTER — Encounter: Payer: Self-pay | Admitting: Adult Health

## 2010-11-28 LAB — CONVERTED CEMR LAB
BUN: 13 mg/dL (ref 6–23)
Calcium: 9.1 mg/dL (ref 8.4–10.5)
Cholesterol: 227 mg/dL — ABNORMAL HIGH (ref 0–200)
Glucose, Bld: 95 mg/dL (ref 70–99)
Sodium: 140 meq/L (ref 135–145)

## 2010-12-01 ENCOUNTER — Encounter (INDEPENDENT_AMBULATORY_CARE_PROVIDER_SITE_OTHER): Payer: BC Managed Care – HMO

## 2010-12-01 ENCOUNTER — Encounter: Payer: Self-pay | Admitting: Cardiology

## 2010-12-01 DIAGNOSIS — Z7901 Long term (current) use of anticoagulants: Secondary | ICD-10-CM

## 2010-12-01 DIAGNOSIS — I4891 Unspecified atrial fibrillation: Secondary | ICD-10-CM

## 2010-12-01 NOTE — Assessment & Plan Note (Signed)
Summary: 1 MTH F/U PER CHECKOUT ON 10/20/10/TG/AMD  Medications Added CAPTOPRIL 25 MG TABS (CAPTOPRIL) take 1 tablet by mouth two times a day HYDROCHLOROTHIAZIDE 25 MG TABS (HYDROCHLOROTHIAZIDE) take 1/2 tablet by mouth once daily PRILOSEC 20 MG CPDR (OMEPRAZOLE) take 1 tab two times a day      Allergies Added: NKDA  Visit Type:  Follow-up Primary Provider:  Dr.Fusco  CC:  .Marland Kitchen  History of Present Illness: Brent Mason is a pleasant 65 y/o CM with significant central obesity, permanent atrial fib on coumadin and followed in the [place] office, and hypertension.  He was seen here in clinic one month ago for complaints of dyspnea when he bends over with associated dizziness.  A walking O2 Sat% and rhythm strip was completed which was found to be WNL. Echocardiogram and BMET was completed.  All were found to be WNL.  He was advised on wt loss, increase exercise.  He is here for follow-up.  He is feeling better with his symptoms.  Current Medications (verified): 1)  Coumadin 5 Mg Tabs (Warfarin Sodium) .... As Directed 2)  Captopril 25 Mg Tabs (Captopril) .... Take 1 Tablet By Mouth Two Times A Day 3)  [facility] Iron 325 (65 Fe) Mg Tabs (Ferrous Sulfate) .... Once Daily 4)  Hydrochlorothiazide 25 Mg Tabs (Hydrochlorothiazide) .... Take 1/2 Tablet By Mouth Once Daily 5)  Prilosec 20 Mg Cpdr (Omeprazole) .... Take 1 Tab Two Times A Day  Allergies (verified): No Known Drug Allergies  Comments:  Nurse/Medical Assistant: patient brought meds he is on omeprazole 20 two times a day instead of protonix  40 mg he is not taking naproxyn [facility]   Review of Systems       All other systems have been reviewed and are negative unless stated above.   Vital Signs:  Patient profile:   65 year old male Weight:      293 pounds BMI:     39.88 O2 Sat:      97 % on Room air Pulse rate:   80 / minute BP sitting:   155 / 96  (left arm)  Vitals Entered By: Dreama Saa, CNA (November 21, 2010  11:38 AM)  O2 Flow:  Room air  Physical Exam  General:  normal appearance and obese.  normal appearance.   Lungs:  Clear bilaterally to auscultation and percussion. Heart:  Irregular rhythum without MRG, pulses are palpable. No bruits are appreciated. Abdomen:  Bowel sounds positive; abdomen soft and non-tender without masses, organomegaly, or hernias noted. No hepatosplenomegaly. Msk:  Back normal, normal gait. Muscle strength and tone normal. Pulses:  pulses normal in all 4 extremities Extremities:  No clubbing or cyanosis. Neurologic:  Alert and oriented x 3. Hard of hearing. Psych:  Normal affect.   Impression & Recommendations:  Problem # 1:  ATRIAL FIBRILLATION (ICD-427.31) Rate is well controlled with no rate reducing medications.  He is symptomatic and expresses no palpatations or racing.  His echo demonstrated normal EF of 55%-60%, No WMA.  The left and right atriums were moderately dialated. His updated medication list for this problem includes:    Coumadin 5 Mg Tabs (Warfarin sodium) .Marland Kitchen... As directed  Problem # 2:  HYPERTENSION (ICD-401.9) Blood pressure in not optimal.  Will increase captopril to 25mg  two times a day to have range of 130's systolically.   He is advised to begin a exercise regime and wt loss program to assist in BP control and elemination of or reduction of  medication doses. He will have BP check by nurses in one month and follow up appointment in 6 months unless he becomes symptomatic. His updated medication list for this problem includes:    Captopril 25 Mg Tabs (Captopril) .Marland Kitchen... Take 1 tablet by mouth two times a day    Hydrochlorothiazide 25 Mg Tabs (Hydrochlorothiazide) .Marland Kitchen... Take 1/2 tablet by mouth once daily  Problem # 3:  HYPERLIPIDEMIA (ICD-272.4) We do not have any records of current cholesterol status.  Plan follow-up fasting lipids and LFT's in one week.  He will have BMET at that time to assess kidney fx with increased dose of captopril.  We  will see him in 6 months. Future Orders: T-Basic Metabolic Panel [id number]) ... 11/28/2010  Other Orders: Future Orders: T-Lipid Profile ([id number]) ... 11/28/2010  Patient Instructions: 1)  Your physician recommends that you schedule a follow-up appointment in: 1 month for blood pressure check with nurse and in 6 months 2)  Your physician recommends that you return for lab work in: 1 week 3)  Your physician has recommended you make the following change in your medication: Increase Captopril to 25mg  by mouth two times a day  4)  Your physician has requested that you regularly monitor and record your blood pressure readings at home.  Please use the same machine at the same time of day to check your readings and record them to bring to your follow-up visit. 5)  ***Please contact office for SBP (top number) greater than 150 or lower than 100 6)  Your physician encouraged you to lose weight for better health. Prescriptions: HYDROCHLOROTHIAZIDE 25 MG TABS (HYDROCHLOROTHIAZIDE) take 1/2 tablet by mouth once daily  #45 x 1   Entered by:   Larita Fife Via LPN   Authorized by:   Joni Reining, NP   Signed by:   Larita Fife Via LPN on 84/13/2440   Method used:   Electronically to        [facility] (retail)       [address]       [place], [place]  [address]       Ph: [phone]       Fax: [phone]   RxID:   [id number] PRILOSEC 20 MG CPDR (OMEPRAZOLE) take 1 tab two times a day  #180 x 1   Entered by:   Larita Fife Via LPN   Authorized by:   Joni Reining, NP   Signed by:   Larita Fife Via LPN on 30/16/0109   Method used:   Electronically to        [facility] (retail)       [address]       [place], [place]  [address]       Ph: [phone]       Fax: [phone]   RxID:   [id number] CAPTOPRIL 25 MG TABS (CAPTOPRIL) take 1 tablet by mouth two times a day  #60 x 0   Entered by:   Larita Fife Via LPN   Authorized by:    Joni Reining, NP   Signed by:   Larita Fife Via LPN on 48/54/6270   Method used:   Electronically to        [facility] (retail)       [address]       [place], [place]  [address]  Ph: [phone]       Fax: [phone]   RxID:   [id number]

## 2010-12-01 NOTE — Letter (Signed)
Summary: Handout Printed  Printed Handout:  - Diet - Calorie Counting

## 2010-12-07 NOTE — Medication Information (Signed)
Summary: ccr/rm  Anticoagulant Therapy  Managed by: Vashti Hey, RN PCP: Dr.Fusco Supervising MD: Diona Browner MD, Remi Deter Indication 1: Atrial Fibrillation (ICD-427.31) Lab Used: [facility] [place] Site: [place] INR POC 2.8  Dietary changes: no    Health status changes: no    Bleeding/hemorrhagic complications: no    Recent/future hospitalizations: no    Any changes in medication regimen? no    Recent/future dental: no  Any missed doses?: no       Is patient compliant with meds? yes       Allergies: No Known Drug Allergies  Anticoagulation Management History:      The patient is taking warfarin and comes in today for a routine follow up visit.  Positive risk factors for bleeding include an age of 19 years or older.  The bleeding index is 'intermediate risk'.  Positive CHADS2 values include History of HTN.  Negative CHADS2 values include Age > 65 years old.  The start date was 09/15/1998.  Anticoagulation responsible provider: Diona Browner MD, Remi Deter.  INR POC: 2.8.  Cuvette Lot#: [id number].  Exp: 10/11.    Anticoagulation Management Assessment/Plan:      The patient's current anticoagulation dose is Coumadin 5 mg tabs: as directed.  The target INR is 2 - 3.  The next INR is due 12/29/2010.  Anticoagulation instructions were given to patient.  Results were reviewed/authorized by Vashti Hey, RN.  He was notified by Vashti Hey RN.         Prior Anticoagulation Instructions: INR 3.0 Continue coumadin 5mg  once daily   Current Anticoagulation Instructions: INR 2.8 Continue coumadin 5mg  once daily  Prescriptions: COUMADIN 5 MG TABS (WARFARIN SODIUM) as directed  #90 x 1   Entered by:   Vashti Hey RN   Authorized by:   Joni Reining, NP   Signed by:   Vashti Hey RN on 12/01/2010   Method used:   Electronically to        [facility] (retail)       [address]       [place], [place]  [address]       Ph:  [phone]       Fax: [phone]   RxID:   [id number]

## 2010-12-29 ENCOUNTER — Encounter: Payer: Self-pay | Admitting: Cardiology

## 2010-12-29 ENCOUNTER — Encounter (INDEPENDENT_AMBULATORY_CARE_PROVIDER_SITE_OTHER): Payer: BC Managed Care – HMO

## 2010-12-29 DIAGNOSIS — Z7901 Long term (current) use of anticoagulants: Secondary | ICD-10-CM

## 2010-12-29 DIAGNOSIS — I4891 Unspecified atrial fibrillation: Secondary | ICD-10-CM

## 2010-12-31 LAB — PROTIME-INR
INR: 1.32 (ref 0.00–1.49)
Prothrombin Time: 16.3 seconds — ABNORMAL HIGH (ref 11.6–15.2)

## 2011-01-01 LAB — PROTIME-INR
INR: 1.05 (ref 0.00–1.49)
Prothrombin Time: 13.6 seconds (ref 11.6–15.2)

## 2011-01-01 LAB — COMPREHENSIVE METABOLIC PANEL
AST: 28 U/L (ref 0–37)
Albumin: 4 g/dL (ref 3.5–5.2)
Calcium: 9.1 mg/dL (ref 8.4–10.5)
Chloride: 107 mEq/L (ref 96–112)
Creatinine, Ser: 1.1 mg/dL (ref 0.4–1.5)
GFR calc Af Amer: >60 mL/min (ref >60)
Sodium: 140 mEq/L (ref 135–145)
Total Bilirubin: 1.1 mg/dL (ref 0.3–1.2)

## 2011-01-01 LAB — CBC
MCH: 31.5 pg (ref 26.0–34.0)
Platelets: 180 10*3/uL (ref 150–400)
RBC: 4.83 MIL/uL (ref 4.22–5.81)

## 2011-01-01 LAB — APTT: aPTT: 32 seconds (ref 24–37)

## 2011-01-01 LAB — SURGICAL PCR SCREEN: Staphylococcus aureus: POSITIVE — AB

## 2011-01-03 NOTE — Medication Information (Signed)
Summary: ccr-lr  Anticoagulant Therapy  Managed by: Vashti Hey, RN PCP: Dr.Fusco Supervising MD: Dietrich Pates MD, Molly Maduro Indication 1: Atrial Fibrillation (ICD-427.31) Lab Used: [facility] [place] Site: [place] INR POC 2.1  Dietary changes: no    Health status changes: no    Bleeding/hemorrhagic complications: no    Recent/future hospitalizations: no    Any changes in medication regimen? no    Recent/future dental: no  Any missed doses?: no       Is patient compliant with meds? yes       Allergies: No Known Drug Allergies  Anticoagulation Management History:      The patient is taking warfarin and comes in today for a routine follow up visit.  Positive risk factors for bleeding include an age of 66 years or older.  The bleeding index is 'intermediate risk'.  Positive CHADS2 values include History of HTN.  Negative CHADS2 values include Age > 12 years old.  The start date was 09/15/1998.  Anticoagulation responsible provider: Dietrich Pates MD, Molly Maduro.  INR POC: 2.1.  Cuvette Lot#: [id number].  Exp: 10/11.    Anticoagulation Management Assessment/Plan:      The patient's current anticoagulation dose is Coumadin 5 mg tabs: as directed.  The target INR is 2 - 3.  The next INR is due 01/26/2011.  Anticoagulation instructions were given to patient.  Results were reviewed/authorized by Vashti Hey, RN.  He was notified by Vashti Hey RN.         Prior Anticoagulation Instructions: INR 2.8 Continue coumadin 5mg  once daily   Current Anticoagulation Instructions: INR 2.1 Continue coumadin 5mg  once daily

## 2011-01-25 ENCOUNTER — Encounter: Payer: Self-pay | Admitting: Internal Medicine

## 2011-01-25 DIAGNOSIS — I4891 Unspecified atrial fibrillation: Secondary | ICD-10-CM

## 2011-01-25 DIAGNOSIS — Z7901 Long term (current) use of anticoagulants: Secondary | ICD-10-CM | POA: Insufficient documentation

## 2011-01-26 ENCOUNTER — Ambulatory Visit (INDEPENDENT_AMBULATORY_CARE_PROVIDER_SITE_OTHER): Payer: BC Managed Care – HMO | Admitting: *Deleted

## 2011-01-26 DIAGNOSIS — Z7901 Long term (current) use of anticoagulants: Secondary | ICD-10-CM

## 2011-01-26 DIAGNOSIS — I4891 Unspecified atrial fibrillation: Secondary | ICD-10-CM

## 2011-02-23 ENCOUNTER — Ambulatory Visit (INDEPENDENT_AMBULATORY_CARE_PROVIDER_SITE_OTHER): Payer: BC Managed Care – HMO | Admitting: *Deleted

## 2011-02-23 DIAGNOSIS — Z7901 Long term (current) use of anticoagulants: Secondary | ICD-10-CM

## 2011-02-23 DIAGNOSIS — I4891 Unspecified atrial fibrillation: Secondary | ICD-10-CM

## 2011-02-28 NOTE — Assessment & Plan Note (Signed)
[facility]CARE                         ELECTROPHYSIOLOGY OFFICE NOTE   JAD, JOHANSSON                     MRN:          [id number]  DATE:05/09/2007                            DOB:          August 04, 1946    Brent Mason returns today for followup.  He is a very pleasant middle-  aged man with a history of chronic atrial fibrillation, chronic Coumadin  therapy, hypertension and rare palpitations.  He has preserved LV  function by echo.  He returns today for followup.  He has been unhappy  about having to take Coumadin and has requested that we stop his  Coumadin.  He states that he does not like coming to the Coumadin  clinic.  He states that he does not like coming to the Coumadin Clinic.   PHYSICAL EXAM:  He is a pleasant, obese middle-aged man in no acute  distress.  Blood pressure 126/50, the pulse 74 and irregular, the  respirations were 16, the weight was 275 pounds.  NECK:  No jugular venous distention.  LUNGS:  Clear bilaterally to auscultation.  No wheezes, rales or rhonchi  were present.  CARDIOVASCULAR EXAM:  With an irregularly regular rhythm with a normal  S1 and S2.  EXTREMITIES:  Demonstrate no cyanosis, clubbing or edema.  ABDOMINAL EXAM:  Obese, nontender, nondistended.   The EKG demonstrates atrial fibrillation with a controlled ventricular  response.   IMPRESSION:  1. Chronic atrial fibrillation.  2. Hypertension.  3. Dyslipidemia.  4. Chronic Coumadin therapy with some history of noncompliance.   DISCUSSION:  I have discussed the importance of maintaining compliance  with Mr. Maione.  He states that he will continue his Coumadin, take  it as he is instructed and follow up in our Coumadin clinic as he is  scheduled to do.     Doylene Canning. Ladona Ridgel, MD  Electronically Signed    GWT/MedQ  DD: 05/09/2007  DT: 05/10/2007  Job #: [id number]   cc:   Gerrit Friends. Dietrich Pates, MD, [facility]  Madelin Rear. Sherwood Gambler, MD

## 2011-02-28 NOTE — Assessment & Plan Note (Signed)
[facility]CARE                         ELECTROPHYSIOLOGY OFFICE NOTE   HARTWELL, VANDIVER                     MRN:          [id number]  DATE:04/22/2008                            DOB:          15-Sep-1946    [name] returns today for followup.  He is a very pleasant middle-  aged male with chronic atrial fibrillation, bradycardia, sleep apnea for  which he is unable to wear a sleep monitor, and chronic fatigue, who  returns today for followup.  The patient despite being able to work  complains of feeling tired and weak and having no energy.  His wife  states that if he didn't exercise he would be sitting around all the  time.  He had no specific complaints today.   MEDICATIONS:  1. Protonix 40 mg a day.  2. Iron supplement.  3. Coumadin as directed.  4. Captopril 12.5 twice daily.   PHYSICAL EXAMINATION:  GENERAL:  He is a pleasant middle-aged man, in no  acute distress.  VITAL SIGNS:  His blood pressure today was 150/100, the pulse was 87 and  regular, the respirations were 18, and weight was 286 pounds.  NECK:  No jugular venous distention.  LUNGS:  Clear bilaterally to auscultation.  No wheezes, rales, or  rhonchi were present.  CARDIOVASCULAR:  Irregular rhythm with normal S1 and S2. I could not  appreciated any murmurs.  ABDOMINAL:  Obese, nontender, and nondistended.  There was no  organomegaly.  EXTREMITIES:  No edema.   IMPRESSION:  1. Chronic atrial fibrillation.  2. Severe fatigue and weakness.  3. Morbid obesity.  4. Sleep apnea, not able to take sleep apnea therapy.   DISCUSSION:  [name] is stable, but continues to be chronically  fatigued and this may be worse.  He does like to consider wearing a  cardiac monitor and I would be happy to refer this for the patient.  I  will plan to see him back after the results of the monitor are  available.     Doylene Canning. Ladona Ridgel, MD  Electronically Signed    GWT/MedQ  DD:  04/22/2008  DT: 04/23/2008  Job #: [id number]

## 2011-02-28 NOTE — Assessment & Plan Note (Signed)
[facility] HEALTHCARE                         ELECTROPHYSIOLOGY OFFICE NOTE   TAVIEN, CHESTNUT                     MRN:          [id number]  DATE:07/01/2008                            DOB:          1946-03-17    Brent Mason returns here for followup.  He is a pleasant middle-aged  male with chronic atrial fibrillation, history of bradycardia and sleep  apnea, obesity.  He has been intolerant to sleep apnea masks in the past  secondary to noncompliance.  He returns today for followup.  When I saw  him back several months ago, he was fatigued and weak and he had some GI  bleeding and he recently underwent colonoscopy.  I do not have the  dictated report, but the patient sounds like he had some polyps and no  other significant abnormalities found perhaps with exception of some  internal hemorrhoids.  He returns today for followup.  He denies chest  pain.  He denies shortness of breath.  He does have some fatigue and  weakness and notes that does not sleep well at night.   On physical exam, he is a pleasant, obese, middle-aged man in no  distress.  The blood pressure today was 120/90, the pulse 68 and  irregular, respirations were 18.  Weight was 277 pounds.  Neck revealed  no jugular venous distention.  The lungs clear bilaterally to  auscultation.  No wheezes, rales, or rhonchi are present.  The  cardiovascular exam showed an irregular rhythm with normal S1 and S2.  Abdominal exam was soft and nontender.  Extremities demonstrated no  peripheral edema.   Medications include Protonix 40 a day, iron supplement, Coumadin as  directed, and Captopril 12.5 twice daily.   IMPRESSION:  1. Symptomatic bradycardia now minimally symptomatic.  2. Chronic atrial fibrillation.  3. Hypertension.  4. Sleep apnea.  5. Obesity.   DISCUSSION:  Mr. Radin is stable fro arhythmia perspective.  His AFib  is well controlled based on his cardiac monitor from several weeks  ago.  I have asked that he continue his current medical therapy.  He will  follow up in our Coumadin Clinic as directed, and I will plan to see the  patient back in the office in 1 year for a followup.  I have made no  medicine changes today.  I have asked him to call us if he experiences  episodes of severe fatigue or weakness.     Doylene Canning. Ladona Ridgel, MD  Electronically Signed    GWT/MedQ  DD: 07/01/2008  DT: 07/02/2008  Job #: [id number]   cc:   Madelin Rear. Sherwood Gambler, MD

## 2011-02-28 NOTE — Assessment & Plan Note (Signed)
[facility]CARE                       [place] CARDIOLOGY OFFICE NOTE   PHILLP, DOLORES                     MRN:          [id number]  DATE:04/27/2008                            DOB:          [birth year]    HOLTER MONITOR REPORT   REFERRING PHYSICIAN:  Doylene Canning. Ladona Ridgel, MD   CLINICAL DATA:  A 65 year old gentleman with atrial fibrillation.  1. Continuous electrocardiographic recording was maintained for 23      hours and 19 minutes during which the predominant rhythm was atrial      fibrillation with controlled ventricular response.  Heart rate      ranged from 51-128 with a mean of 78 beats per minute.  2. Occasional premature ventricular ectopics were recorded, occurring      at an average rate of 7 per hour.  A single PVC pair was      identified.  3. Pauses up to 2 seconds occurred without symptoms.  4. No significant ST-segment elevation nor depression was identified.  5. A complete diary of activity was written.  No symptoms were      reported.     Gerrit Friends. Dietrich Pates, MD, [facility]  Electronically Signed    RMR/MedQ  DD: 04/27/2008  DT: 04/27/2008  Job #: [id number]

## 2011-03-03 NOTE — Discharge Summary (Signed)
[place]  Patient:    Brent Mason, Brent Mason Visit Number: [id number] MRN: [id number]          Service Type: MED Location: [id number] Attending Physician:  Edwyna Perfect Dictated by:   Ladell Pier, M.D. Admit Date:  12/15/2001 Discharge Date: 12/18/2001   CC:         Dr. Cornelia Copa, [place]  Venita Lick. Pleas Koch., M.D. [facility]  Doylene Canning. Ladona Ridgel, M.D. [facility]   Discharge Summary  DISCHARGE DIAGNOSES: 1. Gastrointestinal bleed.  Esophagogastroduodenoscopy on December 17, 2001,    showed erosions from the body to the antrum of the stomach with diagnosis    of chronic gastritis.  The patient was transfused two units of packed red    blood cells.  Helicobacter pylori pending. 2. Atrial fibrillation on chronic Coumadin therapy since November 1999.    Direct chamber cardioversion x2 with last in March 2000.  Coumadin was held    during admission.  Stress Cardiolite in 1999, showed no ischemia.    Two-dimensional echocardiogram in 1999, showed ejection fraction of 50%.    Two-dimensional echocardiogram on December 17, 2001, showed ejection fraction    between 55-65% with hypokinesis of the septal wall.  The left ventricular    size is upper limits of normal.  Mild increase of the left ventricular    wall.  Mild aortic regurgitation.  Mild mitral valve regurgitation.  Left    atrium moderately dilated.  Right atrium mildly to moderately dilated. 3. Microcytic anemia.    a. Ferritin level 5.  Hemoglobin of 8.    b. Transfusion of two units of packed red blood cells. 4. Hypertension. 5. History of esophageal stricture in the past, status post dilatation. 6. History of volvulus, status post surgery in 1998 and 1999.  DISCHARGE MEDICATIONS: 1. Coumadin 5 mg q.d.; 2.5 mg on Tuesday and Thursday. 2. Captopril 12.5 mg b.i.d. 3. Ambien 10 mg q.h.s. for sleep. 4. Nexium 40 mg once a day. 5. Iron sulfate 325 mg t.i.d. 6. Stool softener of choice.  FOLLOWUP:   The patient was told to make an appointment to follow up with his primary care doctor, Dr. Lucia Estelle, at family practice clinic for clearance to resume regular duties at work.  His duties involve heights.  CONSULTATION:  Gastroenterology, Dr. Russella Dar.  PROCEDURE:  Upper endoscopy.  CHIEF COMPLAINT:  Dizziness.  HISTORY OF PRESENT ILLNESS:  Brent Mason is a 65 year old, white male with past medical history significant for hypertension, atrial fibrillation and anemia.  The patient was at work at 7 a.m. that morning.  While he was standing, he felt dizzy with no vertigo or syncope.  He held onto a rail and sat down.  He had no chest pain and was a little nauseous and diaphoretic.  He had been feeling tired for the past few months.  He presented to the doctor two weeks ago prior to admission and was told that he had heme positive stools.  He noted no gross blood in his stool, but noted that on occasions that his stool would be streaked with blood mostly during the times he was constipated.  He stated that he had never had this happen in the past.  He also complained of polyuria and nocturia.  He has not been sick recently with no palpitations.  PAST MEDICAL HISTORY:  As per discharge diagnoses.  FAMILY HISTORY:  Mother has heart disease.  She has a pacemaker and is 75 years old.  Father is 41 and has diabetes.  Children without medical problems.  SOCIAL HISTORY:  No tobacco use.  Alcohol with none x11 years.  Use to have a drink up to that point.  No IV drug use.  He is married and lives in [place].  He works in a plant.  He has two children.  MEDICATIONS: 1. Zoloft 50 mg q.d. 2. Warfarin sodium 5 mg q.d. except Tuesday and Thursday at 2.5 mg q.d. 3. Captopril 12.5 mg b.i.d.  ALLERGIES:  No known drug allergies.  REVIEW OF SYSTEMS:  CONSTITUTIONAL: Positive for fatigue for a few months, insomnia and shortness of breath.  CARDIAC: Hypertension with one pillow at night.   GASTROINTESTINAL: Diaphoresis, indigestion, constipation, urinary frequency three to four times per night, polyuria.  MUSCULOSKELETAL: Cramping in lower extremities at night with heat intolerance.  PHYSICAL EXAMINATION:  VITAL SIGNS:  Blood pressure 108/72 lying down, pulse 79; sitting up blood pressure is 124/86, pulse 95; standing blood pressure 130/80, pulse 106. Temperature 97.3, respiratory rate 20, saturations 100% on room air.  GENERAL:  The patient is sitting up in bed and appears a little older than his stated age.  HEENT:  Head normocephalic, atraumatic.  Pupils are equal, round and reactive to light.  Throat with no erythema.  CARDIAC:  Irregularly, irregular, no murmurs, rubs or gallops.  S1, S2 with no carotid bruit or JVD.  LUNGS:  Clear to auscultation bilaterally with no wheezes, crackles or rhonchi.  ABDOMEN:  Soft, nontender, a little obese, positive bowel sounds.  RECTAL:  Heme positive stools that were done per the emergency department physician.  EXTREMITIES:  No edema.  Cold to touch with 2+ DP pulses bilaterally.  LYMPHATICS:  No lymphadenopathy.  NEUROLOGIC:  Cranial nerves II-XII intact.  Strength 5/5 throughout.  No focal deficits.  DTRs 1+ throughout.  HOSPITAL COURSE:  #1 - GASTROINTESTINAL BLEED:  The patient had heme positive stools in the emergency department.  His hemoglobin on admission was 8.7, although he had no growth or blood in his urine.  He had a microcytic anemia showing that he has been chronically bleeding for awhile.  He also stated that he has been having symptoms of reflux and he has been taking ibuprofen quite frequently for headaches.  He has significant GI history with volvulus in the past.  He has no present history of alcohol use with no history of  diverticulosis.  He normally sees Dr. Russella Dar, gastroenterologist, so on his admission Dr. Russella Dar was consulted.  His Coumadin was held.  Dr. Russella Dar treated him with Protonix IV  and ferritin was done showing a level of 5.  Dr. Russella Dar decided to do an upper endoscopy.  This showed chronic gastritis erosions in the body to the antrum of the stomach.  The patient was started on Nexium which was his PPI of his choice.  He was treated with IV fluids during his hospitalization.  He was told to restart his Coumadin one week from discharge per nurse practitioner for Dr. Russella Dar.  We also discussed this with Dr. Ladona Ridgel and he left this decision up to Dr. Russella Dar on when to restart the Coumadin because of the GI bleed.  #2 - ATRIAL FIBRILLATION:  The patients INR on admission was 1.9.  His Coumadin was held during this hospitalization.  On discharge, his INR was 2.4. The patient was told to restart his Coumadin next Wednesday, one week from today.  #3 - HYPERTENSION:  His blood pressure remained stable during his  hospitalization.  His Captopril was held and he was told to restart his Captopril on discharge to home.  DISCHARGE LABORATORY DATA AND X-RAY FINDINGS:  WBC 8.3, hemoglobin 9.9, status post transfusion of two units of packed red blood cells, hematocrit 32, MCV 71.5, platelets 364.  PT 21.9, INR 2.4, sodium 141, potassium 4, chloride 109, CO2 27, glucose 91, BUN 6, creatinine 0.9, calcium 8.4.  Lipid profile with total cholesterol 144, triglycerides 71, HDL 35, LDL 95.  His cardiac enzymes were all negative.  CK 203, 218, 203; CK-MB 3.0, 3.1, 3.7; relative index 1.5, 1.4, 1.8; troponin I 0.01, 0.01, 0.01.  His TSH was 1.722.  Urine drug screen negative.  Alcohol less than 5.  H. pylori results pending.  A 2-D echocardiogram showed ejection fraction of 55-65% with left atrium moderately dilated, right atrium mild to moderate dilation.  EGD results showed chronic gastritis.  Erosions present from the body to the antrum. Dictated by:   Ladell Pier, M.D. Attending Physician:  Edwyna Perfect DD:  12/18/01 TD:  12/19/01 Job: [id number]

## 2011-03-23 ENCOUNTER — Ambulatory Visit (INDEPENDENT_AMBULATORY_CARE_PROVIDER_SITE_OTHER): Payer: BC Managed Care – HMO | Admitting: *Deleted

## 2011-03-23 DIAGNOSIS — Z7901 Long term (current) use of anticoagulants: Secondary | ICD-10-CM

## 2011-03-23 DIAGNOSIS — I4891 Unspecified atrial fibrillation: Secondary | ICD-10-CM

## 2011-04-20 ENCOUNTER — Ambulatory Visit (INDEPENDENT_AMBULATORY_CARE_PROVIDER_SITE_OTHER): Payer: BC Managed Care – HMO | Admitting: *Deleted

## 2011-04-20 DIAGNOSIS — I4891 Unspecified atrial fibrillation: Secondary | ICD-10-CM

## 2011-04-20 DIAGNOSIS — Z7901 Long term (current) use of anticoagulants: Secondary | ICD-10-CM

## 2011-05-18 ENCOUNTER — Ambulatory Visit (INDEPENDENT_AMBULATORY_CARE_PROVIDER_SITE_OTHER): Payer: BC Managed Care – HMO | Admitting: *Deleted

## 2011-05-18 DIAGNOSIS — Z7901 Long term (current) use of anticoagulants: Secondary | ICD-10-CM

## 2011-05-18 DIAGNOSIS — I4891 Unspecified atrial fibrillation: Secondary | ICD-10-CM

## 2011-06-21 ENCOUNTER — Ambulatory Visit (INDEPENDENT_AMBULATORY_CARE_PROVIDER_SITE_OTHER): Payer: BC Managed Care – HMO | Admitting: *Deleted

## 2011-06-21 DIAGNOSIS — Z7901 Long term (current) use of anticoagulants: Secondary | ICD-10-CM

## 2011-06-21 DIAGNOSIS — I4891 Unspecified atrial fibrillation: Secondary | ICD-10-CM

## 2011-06-21 LAB — POCT INR: INR: 3.8

## 2011-07-19 ENCOUNTER — Ambulatory Visit (INDEPENDENT_AMBULATORY_CARE_PROVIDER_SITE_OTHER): Payer: BC Managed Care – HMO | Admitting: *Deleted

## 2011-07-19 DIAGNOSIS — Z7901 Long term (current) use of anticoagulants: Secondary | ICD-10-CM

## 2011-07-19 DIAGNOSIS — I4891 Unspecified atrial fibrillation: Secondary | ICD-10-CM

## 2011-07-19 LAB — POCT INR: INR: 2.8

## 2011-08-21 ENCOUNTER — Ambulatory Visit (INDEPENDENT_AMBULATORY_CARE_PROVIDER_SITE_OTHER): Payer: BC Managed Care – HMO | Admitting: *Deleted

## 2011-08-21 DIAGNOSIS — Z7901 Long term (current) use of anticoagulants: Secondary | ICD-10-CM

## 2011-08-21 DIAGNOSIS — I4891 Unspecified atrial fibrillation: Secondary | ICD-10-CM

## 2011-09-18 ENCOUNTER — Telehealth: Payer: Self-pay

## 2011-09-18 NOTE — Telephone Encounter (Signed)
Called pt on 09/15/2011 and he said he will check and see when he had his last one done in [place] and call me back.

## 2011-09-25 ENCOUNTER — Ambulatory Visit (INDEPENDENT_AMBULATORY_CARE_PROVIDER_SITE_OTHER): Payer: BC Managed Care – HMO | Admitting: *Deleted

## 2011-09-25 DIAGNOSIS — I4891 Unspecified atrial fibrillation: Secondary | ICD-10-CM

## 2011-09-25 DIAGNOSIS — Z7901 Long term (current) use of anticoagulants: Secondary | ICD-10-CM

## 2011-09-25 LAB — POCT INR: INR: 2.9

## 2011-10-03 NOTE — Telephone Encounter (Signed)
Mailed letter for pt to call.

## 2011-10-16 ENCOUNTER — Encounter: Payer: Self-pay | Admitting: Cardiology

## 2011-10-20 ENCOUNTER — Telehealth: Payer: Self-pay

## 2011-10-20 NOTE — Telephone Encounter (Signed)
Pt called back and was ready to schedule colonoscopy. However, Tana Coast, PA reviewed his path and opt report from his colonoscopy at [facility] on 06/25/2008. She said according to the guidelines, pt will not be due for colonoscopy til 06/2013 ( 5 years after the last), unless he is having any problems such as rectal bleeding or anemia. I informed the pt. He said he is not having any rectal bleeding or anemia. I am also faxing a note in reference to the above to his PCP, Dr. Sherwood Gambler. Pt will call if he has problems. ( he will also be nic'd in computer for his next one in 06/2013.

## 2011-10-20 NOTE — Telephone Encounter (Signed)
As noted. Reviewed TCS report from 2009. Patient had three polyps removed. Only two were tubular adenomas/adenomas. No FH of CRC. No reported gi issues or anemia.  Based on current guidelines were offer repeat TCS in 06/2013.  Will send to RMR to sign off.

## 2011-10-20 NOTE — Telephone Encounter (Signed)
Reminder in epic to have next tcs in 06/2013

## 2011-10-22 NOTE — Telephone Encounter (Signed)
2014 should be ok

## 2011-10-23 NOTE — Telephone Encounter (Signed)
Reminder in epic to have tcs done in 06/2013

## 2011-10-23 NOTE — Telephone Encounter (Signed)
Routing to Darl Pikes to assure pt is on Dr. Luvenia Starch list of recalls.

## 2011-10-30 ENCOUNTER — Ambulatory Visit (INDEPENDENT_AMBULATORY_CARE_PROVIDER_SITE_OTHER): Payer: Medicare Other | Admitting: *Deleted

## 2011-10-30 DIAGNOSIS — I4891 Unspecified atrial fibrillation: Secondary | ICD-10-CM

## 2011-10-30 DIAGNOSIS — Z7901 Long term (current) use of anticoagulants: Secondary | ICD-10-CM

## 2011-10-30 LAB — POCT INR: INR: 2.5

## 2011-12-11 ENCOUNTER — Ambulatory Visit (INDEPENDENT_AMBULATORY_CARE_PROVIDER_SITE_OTHER): Payer: Medicare Other | Admitting: *Deleted

## 2011-12-11 DIAGNOSIS — I4891 Unspecified atrial fibrillation: Secondary | ICD-10-CM

## 2011-12-11 DIAGNOSIS — Z7901 Long term (current) use of anticoagulants: Secondary | ICD-10-CM

## 2012-01-22 ENCOUNTER — Ambulatory Visit (INDEPENDENT_AMBULATORY_CARE_PROVIDER_SITE_OTHER): Payer: Medicare Other | Admitting: *Deleted

## 2012-01-22 DIAGNOSIS — Z7901 Long term (current) use of anticoagulants: Secondary | ICD-10-CM

## 2012-01-22 DIAGNOSIS — I4891 Unspecified atrial fibrillation: Secondary | ICD-10-CM

## 2012-03-06 ENCOUNTER — Ambulatory Visit (INDEPENDENT_AMBULATORY_CARE_PROVIDER_SITE_OTHER): Payer: Medicare Other | Admitting: *Deleted

## 2012-03-06 DIAGNOSIS — Z7901 Long term (current) use of anticoagulants: Secondary | ICD-10-CM

## 2012-03-06 DIAGNOSIS — I4891 Unspecified atrial fibrillation: Secondary | ICD-10-CM

## 2012-04-03 ENCOUNTER — Ambulatory Visit (INDEPENDENT_AMBULATORY_CARE_PROVIDER_SITE_OTHER): Payer: Medicare Other | Admitting: *Deleted

## 2012-04-03 DIAGNOSIS — I4891 Unspecified atrial fibrillation: Secondary | ICD-10-CM

## 2012-04-03 DIAGNOSIS — Z7901 Long term (current) use of anticoagulants: Secondary | ICD-10-CM

## 2012-04-05 ENCOUNTER — Other Ambulatory Visit: Payer: Self-pay | Admitting: Cardiology

## 2012-04-05 NOTE — Telephone Encounter (Signed)
Patient states that this med has initially been refilled by Dr.Fusco.. Wants to know if we can refill it because he is past due with Dr.Fusco.  Patient hasn't been seen here since 11/2010. / tg

## 2012-04-22 ENCOUNTER — Telehealth: Payer: Self-pay

## 2012-04-22 NOTE — Telephone Encounter (Signed)
Received another referral request from Dr. Sherwood Gambler for colonoscopy. ( See previous phone note of 10/20/2011). I called pt and he is having some occasional blood in stool. OV scheduled with Tana Coast, PA on 05/07/2012 @ 11:00 AM.

## 2012-05-07 ENCOUNTER — Ambulatory Visit (INDEPENDENT_AMBULATORY_CARE_PROVIDER_SITE_OTHER): Payer: Medicare Other | Admitting: Gastroenterology

## 2012-05-07 ENCOUNTER — Encounter: Payer: Self-pay | Admitting: Gastroenterology

## 2012-05-07 ENCOUNTER — Other Ambulatory Visit: Payer: Self-pay | Admitting: Internal Medicine

## 2012-05-07 VITALS — BP 132/85 | HR 77 | Temp 97.6°F | Ht 74.0 in | Wt 288.8 lb

## 2012-05-07 DIAGNOSIS — Z860100 Personal history of colon polyps, unspecified: Secondary | ICD-10-CM

## 2012-05-07 DIAGNOSIS — D649 Anemia, unspecified: Secondary | ICD-10-CM

## 2012-05-07 DIAGNOSIS — K625 Hemorrhage of anus and rectum: Secondary | ICD-10-CM

## 2012-05-07 DIAGNOSIS — K219 Gastro-esophageal reflux disease without esophagitis: Secondary | ICD-10-CM

## 2012-05-07 DIAGNOSIS — Z7901 Long term (current) use of anticoagulants: Secondary | ICD-10-CM

## 2012-05-07 DIAGNOSIS — Z860101 Personal history of adenomatous and serrated colon polyps: Secondary | ICD-10-CM

## 2012-05-07 DIAGNOSIS — D126 Benign neoplasm of colon, unspecified: Secondary | ICD-10-CM

## 2012-05-07 NOTE — Progress Notes (Signed)
Faxed to PCP, Dr Fusco 

## 2012-05-07 NOTE — Assessment & Plan Note (Signed)
Doing very well on omeprazole twice a day. Patient denies recurrent esophageal dysphagia. He is not interested in endoscopy at this time.

## 2012-05-07 NOTE — Assessment & Plan Note (Addendum)
Intermittent rectal bleeding in the setting of constipation and Coumadin therapy. Last colonoscopy in 2009. He has a history of adenomatous polyps on multiple procedures. At this time would offer colonoscopy.  I have discussed the risks, alternatives, benefits with regards to but not limited to the risk of reaction to medication, bleeding, infection, perforation and the patient is agreeable to proceed. Written consent to be obtained.  He will hold his iron for 7 days. He will hold his Coumadin for 4 days. We will touch base with [facility] to verify that it is okay to do this. Patient reports holding his Coumadin for prior procedures and surgeries.   Add MiraLax 17 g at bedtime on days he does not have a bowel movement.  I have requested labs from Dr. Sharyon Medicus office for review. Patient reports being on chronic iron therapy for "years", possibly decades since finding out that he was anemic and required blood transfusion. He does not recall any specific workup. Need to address whether or not he still requires iron therapy. Further recommendations to follow once labs are reviewed.

## 2012-05-07 NOTE — Progress Notes (Signed)
Primary Care Physician:  FUSCO,LAWRENCE J., MD  Primary Gastroenterologist:  Michael Rourk, MD   Chief Complaint  Patient presents with  . Colonoscopy    HPI:  Brent Mason is a 65 y.o. male here to schedule colonoscopy. Patient has a history of adenomatous polyps on multiple colonoscopies in the past. His last colonoscopy was in 2009. He complains of small volume brbpr with constipation/straining. He is on chronic coumadin for permanent atrial fibrillation. He denies melena, diarrhea.  Heartburn doing okay on omeprazole twice a day. History of esophageal stricture and food impaction in the past. He describes having multiple esophageal dilations before in [place]. At this time he denies dysphagia. No n/v. Abdominal pain related to ventral hernia at times. He reports remote blood transfusion. Taking iron pills ever since.   Current Outpatient Prescriptions  Medication Sig Dispense Refill  . captopril (CAPOTEN) 25 MG tablet Take 25 mg by mouth 2 (two) times daily.        . ferrous sulfate 325 (65 FE) MG tablet Take 325 mg by mouth daily with breakfast.        . hydrochlorothiazide (HYDRODIURIL) 25 MG tablet Take 12.5 mg by mouth daily.        . HYDROcodone-acetaminophen (VICODIN) 5-500 MG per tablet 0.5 tablets as needed.      . naproxen (NAPROSYN) 500 MG tablet Take 500 mg by mouth as needed.       . omeprazole (PRILOSEC) 20 MG capsule Take 20 mg by mouth 2 (two) times daily.        . theophylline (THEODUR) 100 MG 12 hr tablet 1 tablet daily.      . warfarin (COUMADIN) 5 MG tablet Take by mouth as directed. 5mg daily except 2.5mg on Thursday      . zolpidem (AMBIEN) 10 MG tablet 1 tablet at bedtime.        Allergies as of 05/07/2012  . (No Known Allergies)    Past Medical History  Diagnosis Date  . Hypertension   . Esophageal stricture     several times and food impaction once per patient ([place])  . Dizziness   . A-fib   . Anemia     remote, required transfusion. pt  has taken chronic iron since    Past Surgical History  Procedure Date  . Volvulus reduction 1998,1999  . Colonoscopy 2009    [facility]-Dr. Rehman. three polyps removed. Only two were tubular adenomas/adenomas.  . Foot surgery 2011    right    Family History  Problem Relation Age of Onset  . Heart disease Mother     Pacemaker  . Diabetes Father   . Colon cancer Neg Hx     History   Social History  . Marital Status: Married    Spouse Name: N/A    Number of Children: N/A  . Years of Education: N/A   Occupational History  . LABORER     full time   Social History Main Topics  . Smoking status: Never Smoker   . Smokeless tobacco: Not on file  . Alcohol Use: No  . Drug Use: No  . Sexually Active: Not on file   Other Topics Concern  . Not on file   Social History Narrative   No regular exercise      ROS:  General: Negative for anorexia, weight loss, fever, chills, fatigue, weakness. Eyes: Negative for vision changes.  ENT: Negative for hoarseness, difficulty swallowing , nasal congestion. CV: Negative for chest pain, angina,   palpitations, dyspnea on exertion, peripheral edema.  Respiratory: Negative for dyspnea at rest, dyspnea on exertion, cough, sputum, wheezing.  GI: See history of present illness. GU:  Negative for dysuria, hematuria, urinary incontinence, urinary frequency, nocturnal urination.  MS: Negative for joint pain, low back pain.  Derm: Negative for rash or itching.  Neuro: Negative for weakness, abnormal sensation, seizure, frequent headaches, memory loss, confusion.  Psych: Negative for anxiety, depression, suicidal ideation, hallucinations.  Endo: Negative for unusual weight change.  Heme: Negative for bruising or bleeding. Allergy: Negative for rash or hives.    Physical Examination:  BP 132/85  Pulse 77  Temp 97.6 F (36.4 C) (Temporal)  Ht 6' 2" (1.88 m)  Wt 288 lb 12.8 oz (130.999 kg)  BMI 37.08 kg/m2   General: Well-nourished,  well-developed in no acute distress. obese Head: Normocephalic, atraumatic.   Eyes: Conjunctiva pink, no icterus. Mouth: Oropharyngeal mucosa moist and pink , no lesions erythema or exudate. Neck: Supple without thyromegaly, masses, or lymphadenopathy.  Lungs: Clear to auscultation bilaterally.  Heart: Regular rate and rhythm, no murmurs rubs or gallops.  Abdomen: Bowel sounds are normal, herniation above the umbilicus. Easily reducible. Large ventral defect plus or minus rectus diastases. Nontender, nondistended, no hepatosplenomegaly or masses, no abdominal bruits, no rebound or guarding.   Rectal: deferred to time of colonoscopy Extremities: No lower extremity edema. No clubbing or deformities.  Neuro: Alert and oriented x 4 , grossly normal neurologically.  Skin: Warm and dry, no rash or jaundice.   Psych: Alert and cooperative, normal mood and affect.   Imaging Studies: No results found.    

## 2012-05-07 NOTE — Patient Instructions (Addendum)
Colonoscopy with Dr. Jena Gauss. See separate instructions.  Hold iron for 7 days before procedure. Hold coumadin for 4 days before procedure. Start Miralax 17 grams at bedtime on days you do not have a bowel movement. Prescription sent to your pharmacy. We will request copy of your last labs. We will let you know if you should stop iron for good.  Please compare your medication list we provided you with today with your medications at home. Let us know if any or wrong or missing.

## 2012-05-08 ENCOUNTER — Ambulatory Visit (INDEPENDENT_AMBULATORY_CARE_PROVIDER_SITE_OTHER): Payer: Medicare Other | Admitting: *Deleted

## 2012-05-08 DIAGNOSIS — Z7901 Long term (current) use of anticoagulants: Secondary | ICD-10-CM

## 2012-05-08 DIAGNOSIS — I4891 Unspecified atrial fibrillation: Secondary | ICD-10-CM

## 2012-05-08 LAB — POCT INR: INR: 2.7

## 2012-05-08 NOTE — Progress Notes (Signed)
Per Dawn, no labs within last one year at PCP. Patient needs. CBC, iron and tibc, ferritin.

## 2012-05-08 NOTE — Addendum Note (Signed)
Addended by: Tiffany Kocher on: 05/08/2012 04:34 PM   Modules accepted: Orders

## 2012-05-09 ENCOUNTER — Telehealth: Payer: Self-pay

## 2012-05-09 NOTE — Telephone Encounter (Signed)
Is it OK with you to hold coumadin 4 days before colonoscopy?

## 2012-05-09 NOTE — Telephone Encounter (Signed)
Pt is scheduled for a colonoscopy with Dr. Jena Gauss on 06/03/12. Is it ok to hold his coumadin for 4 days prior to procedure? Please advise.

## 2012-05-12 ENCOUNTER — Encounter: Payer: Self-pay | Admitting: Cardiology

## 2012-05-12 NOTE — Telephone Encounter (Signed)
No record that patient has seen MD since Epic instituted.  Needs MD visit before any judgments can be made about his medical care.

## 2012-05-14 NOTE — Progress Notes (Signed)
Tried to call pt- LMOM 

## 2012-05-14 NOTE — Progress Notes (Signed)
Lab order in mail

## 2012-05-14 NOTE — Telephone Encounter (Signed)
Benedetto Goad called and got pt appt with Katheryn on 05/22/12 at 1pm. Pt is aware of this appt.

## 2012-05-14 NOTE — Telephone Encounter (Signed)
Tried to call pt- LMOM 

## 2012-05-14 NOTE — Progress Notes (Signed)
Pt requested lab order to be mailed to him and he will take it with him to his appt at [facility].

## 2012-05-17 ENCOUNTER — Encounter (HOSPITAL_COMMUNITY): Payer: Self-pay | Admitting: Pharmacy Technician

## 2012-05-17 ENCOUNTER — Other Ambulatory Visit: Payer: Self-pay | Admitting: Gastroenterology

## 2012-05-17 ENCOUNTER — Other Ambulatory Visit: Payer: Self-pay

## 2012-05-17 DIAGNOSIS — D649 Anemia, unspecified: Secondary | ICD-10-CM

## 2012-05-17 DIAGNOSIS — K625 Hemorrhage of anus and rectum: Secondary | ICD-10-CM

## 2012-05-22 ENCOUNTER — Ambulatory Visit (INDEPENDENT_AMBULATORY_CARE_PROVIDER_SITE_OTHER): Payer: Medicare Other | Admitting: Adult Health

## 2012-05-22 ENCOUNTER — Encounter: Payer: Self-pay | Admitting: Adult Health

## 2012-05-22 VITALS — BP 134/92 | HR 74 | Resp 20 | Ht 74.0 in | Wt 290.0 lb

## 2012-05-22 DIAGNOSIS — I4891 Unspecified atrial fibrillation: Secondary | ICD-10-CM

## 2012-05-22 DIAGNOSIS — E7800 Pure hypercholesterolemia, unspecified: Secondary | ICD-10-CM

## 2012-05-22 DIAGNOSIS — I10 Essential (primary) hypertension: Secondary | ICD-10-CM

## 2012-05-22 LAB — CBC WITH DIFFERENTIAL/PLATELET
Basophils Relative: 1 % (ref 0–1)
Eosinophils Absolute: 0.3 10*3/uL (ref 0.0–0.7)
HCT: 49.4 % (ref 39.0–52.0)
Hemoglobin: 16.3 g/dL (ref 13.0–17.0)
Lymphs Abs: 1.3 10*3/uL (ref 0.7–4.0)
MCH: 31.2 pg (ref 26.0–34.0)
MCHC: 33 g/dL (ref 30.0–36.0)
Monocytes Absolute: 0.7 10*3/uL (ref 0.1–1.0)
Monocytes Relative: 10 % (ref 3–12)
Neutro Abs: 4.3 10*3/uL (ref 1.7–7.7)
Neutrophils Relative %: 66 % (ref 43–77)
RBC: 5.23 MIL/uL (ref 4.22–5.81)

## 2012-05-22 LAB — FERRITIN: Ferritin: 216 ng/mL (ref 22–322)

## 2012-05-22 LAB — IRON AND TIBC
%SAT: 32 % (ref 20–55)
TIBC: 366 ug/dL (ref 215–435)

## 2012-05-22 LAB — VITAMIN B12: Vitamin B-12: 513 pg/mL (ref 211–911)

## 2012-05-22 NOTE — Assessment & Plan Note (Signed)
Brent Mason heart rate is well-controlled on current medication regimen. He denies any outpatient surgery dizziness that has occurred in the past. He will be stopping his Coumadin 5 days prior to the colonoscopy which is dated on 06/03/2012. I have spoken with Brent Derby, RN, Coumadin clinic, who has advised that the patient restart his normal dosing the day after the procedure. He will followup with her in one week post procedure on 06/11/2012. This is been explained to the patient who verbalizes understanding. I have ordered a CBC and anemia panel. I have read the notes from Dr.Rhourke's  office requesting this prior to his colonoscopy procedure. Orders have been provided for him with copies to GI.

## 2012-05-22 NOTE — Progress Notes (Signed)
   HPI: [name] is a 66 year old patient of Dr. Sycamore Mason that we follow for ongoing assessment of her in atrial fibrillation and hypertension. The patient is followed in our [place] office Coumadin clinic. He has been seen by Dr. Kendell Mason, and GI secondary to some evidence of GI bleeding, dark stools on iron, and some blood from hemorrhoids. He is scheduled for colonoscopy on 06/03/2012. As result of this the patient will need to stop his Coumadin for 5 days. He is asked to be seen in our office for approval of this. He denies any chest pain, palpitations, dizziness,  or frank bleeding. He is anxious and has many questions concerning lab work that he needs to have drawn but does not orders.   No Known Allergies  Current Outpatient Prescriptions  Medication Sig Dispense Refill  . captopril (CAPOTEN) 25 MG tablet Take 25 mg by mouth 2 (two) times daily. Except one-half tab on Thursdays      . ferrous sulfate 325 (65 FE) MG tablet Take 325 mg by mouth daily with breakfast.        . hydrochlorothiazide (HYDRODIURIL) 25 MG tablet Take 12.5 mg by mouth daily.       Marland Kitchen HYDROcodone-acetaminophen (VICODIN) 5-500 MG per tablet Take 0.5 tablets by mouth daily as needed. Pain      . naproxen (NAPROSYN) 500 MG tablet Take 500 mg by mouth as needed. Pain      . omeprazole (PRILOSEC) 20 MG capsule Take 20 mg by mouth 2 (two) times daily.        . theophylline (THEODUR) 100 MG 12 hr tablet Take 100 mg by mouth daily.       Marland Kitchen warfarin (COUMADIN) 5 MG tablet Take 2.5-5 mg by mouth as directed. 5mg  daily except 2.5mg  on Thursday      . zolpidem (AMBIEN) 10 MG tablet Take 10 mg by mouth at bedtime.         Past Medical History  Diagnosis Date  . Hypertension   . Esophageal stricture     several times and food impaction once per patient Brent Mason)  . Dizziness   . A-fib   . Anemia     remote, required transfusion. pt has taken chronic iron since    Past Surgical History  Procedure Date  .  Volvulus reduction [id number]  . Colonoscopy 2009    [facility]-Dr. Karilyn Mason. three polyps removed. Only two were tubular adenomas/adenomas.  . Foot surgery 2011    right    ZOX:WRUEAV of systems complete and found to be negative unless listed above PHYSICAL EXAM BP 134/92  Pulse 74  Resp 20  Ht 6\' 2"  (1.88 m)  Wt 290 lb 0.6 oz (131.561 kg)  BMI 37.24 kg/m2  General: Well developed, well nourished, in no acute distress Head: Eyes PERRLA, No xanthomas.   Normal cephalic and atramatic  Lungs: Clear bilaterally to auscultation and percussion. Heart: HRIR  without MRG.  Pulses are 2+ & equal.            No carotid bruit. No JVD.  No abdominal bruits. No femoral bruits. Abdomen: Bowel sounds are positive, abdomen soft and non-tender without masses , ventral Hernia  noted. Msk:  Back normal, normal gait. Normal strength and tone for age. Extremities: No clubbing, cyanosis or edema.  DP +1 Neuro: Alert and oriented X 3. Psych:  Good affect, responds appropriately  WUJ:WJXBJY fib rate 74 bpm.  ASSESSMENT AND PLAN

## 2012-05-22 NOTE — Assessment & Plan Note (Addendum)
Blood pressure is currently well controlled. He will continue on hydrochlorothiazide 25 mg daily and captopril. We will see him again in 6 months unless he becomes symptomatic.

## 2012-05-22 NOTE — Patient Instructions (Addendum)
Your physician recommends that you return for lab work in: today (cbc, anemia profile)  Your physician recommends that you schedule a follow-up appointment in: on 06/11/12  Your physician wants you to follow-up in: 6 months.   You will receive a reminder letter in the mail two months in advance. If you don't receive a letter, please call our office to schedule the follow-up appointment.

## 2012-05-23 ENCOUNTER — Encounter: Payer: Self-pay | Admitting: *Deleted

## 2012-05-31 MED ORDER — SODIUM CHLORIDE 0.45 % IV SOLN
Freq: Once | INTRAVENOUS | Status: AC
  Administered 2012-06-03: 08:00:00 via INTRAVENOUS

## 2012-05-31 NOTE — Progress Notes (Signed)
Lab Results  Component Value Date   WBC 6.5 05/22/2012   HGB 16.3 05/22/2012   HCT 49.4 05/22/2012   MCV 94.5 05/22/2012   PLT 234 05/22/2012   Lab Results  Component Value Date   FERRITIN 216 05/22/2012   Lab Results  Component Value Date   IRON 118 05/22/2012   TIBC 366 05/22/2012   FERRITIN 216 05/22/2012   Lab Results  Component Value Date   VITAMINB12 513 05/22/2012   Lab Results  Component Value Date   FOLATE 14.3 05/22/2012    Reviewed recent labs. Look good. TCS as planned.

## 2012-06-03 ENCOUNTER — Ambulatory Visit (HOSPITAL_COMMUNITY)
Admission: RE | Admit: 2012-06-03 | Discharge: 2012-06-03 | Disposition: A | Discharge status: Home / Self Care | Payer: Medicare Other | Source: Ambulatory Visit | Attending: Internal Medicine | Admitting: Internal Medicine

## 2012-06-03 ENCOUNTER — Encounter (HOSPITAL_COMMUNITY): Payer: Self-pay | Admitting: *Deleted

## 2012-06-03 ENCOUNTER — Encounter (HOSPITAL_COMMUNITY)
Admission: RE | Disposition: A | Discharge status: Home / Self Care | Payer: Self-pay | Source: Ambulatory Visit | Attending: Internal Medicine

## 2012-06-03 DIAGNOSIS — K625 Hemorrhage of anus and rectum: Secondary | ICD-10-CM

## 2012-06-03 DIAGNOSIS — Z860100 Personal history of colon polyps, unspecified: Secondary | ICD-10-CM | POA: Insufficient documentation

## 2012-06-03 DIAGNOSIS — K5730 Diverticulosis of large intestine without perforation or abscess without bleeding: Secondary | ICD-10-CM | POA: Insufficient documentation

## 2012-06-03 DIAGNOSIS — Z7901 Long term (current) use of anticoagulants: Secondary | ICD-10-CM | POA: Insufficient documentation

## 2012-06-03 DIAGNOSIS — D126 Benign neoplasm of colon, unspecified: Secondary | ICD-10-CM | POA: Insufficient documentation

## 2012-06-03 DIAGNOSIS — K219 Gastro-esophageal reflux disease without esophagitis: Secondary | ICD-10-CM

## 2012-06-03 HISTORY — PX: COLONOSCOPY: SHX5424

## 2012-06-03 SURGERY — COLONOSCOPY
Anesthesia: Moderate Sedation

## 2012-06-03 MED ORDER — STERILE WATER FOR IRRIGATION IR SOLN
Status: DC | PRN
  Administered 2012-06-03: 09:00:00

## 2012-06-03 MED ORDER — MIDAZOLAM HCL 5 MG/5ML IJ SOLN
INTRAMUSCULAR | Status: DC
Start: 2012-06-03 — End: 2012-06-03
  Filled 2012-06-03: qty 10

## 2012-06-03 MED ORDER — MEPERIDINE HCL 100 MG/ML IJ SOLN
INTRAMUSCULAR | Status: DC | PRN
  Administered 2012-06-03: 50 mg via INTRAVENOUS
  Administered 2012-06-03: 25 mg via INTRAVENOUS

## 2012-06-03 MED ORDER — MIDAZOLAM HCL 5 MG/5ML IJ SOLN
INTRAMUSCULAR | Status: DC | PRN
  Administered 2012-06-03: 2 mg via INTRAVENOUS
  Administered 2012-06-03 (×3): 1 mg via INTRAVENOUS

## 2012-06-03 MED ORDER — MEPERIDINE HCL 100 MG/ML IJ SOLN
INTRAMUSCULAR | Status: AC
  Filled 2012-06-03: qty 1

## 2012-06-03 NOTE — H&P (View-Only) (Signed)
Primary Care Physician:  Cassell Smiles., MD  Primary Gastroenterologist:  Roetta Sessions, MD   Chief Complaint  Patient presents with  . Colonoscopy    HPI:  Brent Mason is a 66 y.o. male here to schedule colonoscopy. Patient has a history of adenomatous polyps on multiple colonoscopies in the past. His last colonoscopy was in 2009. He complains of small volume brbpr with constipation/straining. He is on chronic coumadin for permanent atrial fibrillation. He denies melena, diarrhea.  Heartburn doing okay on omeprazole twice a day. History of esophageal stricture and food impaction in the past. He describes having multiple esophageal dilations before in [place]. At this time he denies dysphagia. No n/v. Abdominal pain related to ventral hernia at times. He reports remote blood transfusion. Taking iron pills ever since.   Current Outpatient Prescriptions  Medication Sig Dispense Refill  . captopril (CAPOTEN) 25 MG tablet Take 25 mg by mouth 2 (two) times daily.        . ferrous sulfate 325 (65 FE) MG tablet Take 325 mg by mouth daily with breakfast.        . hydrochlorothiazide (HYDRODIURIL) 25 MG tablet Take 12.5 mg by mouth daily.        Marland Kitchen HYDROcodone-acetaminophen (VICODIN) 5-500 MG per tablet 0.5 tablets as needed.      . naproxen (NAPROSYN) 500 MG tablet Take 500 mg by mouth as needed.       Marland Kitchen omeprazole (PRILOSEC) 20 MG capsule Take 20 mg by mouth 2 (two) times daily.        . theophylline (THEODUR) 100 MG 12 hr tablet 1 tablet daily.      Marland Kitchen warfarin (COUMADIN) 5 MG tablet Take by mouth as directed. 5mg  daily except 2.5mg  on Thursday      . zolpidem (AMBIEN) 10 MG tablet 1 tablet at bedtime.        Allergies as of 05/07/2012  . (No Known Allergies)    Past Medical History  Diagnosis Date  . Hypertension   . Esophageal stricture     several times and food impaction once per patient [name])  . Dizziness   . A-fib   . Anemia     remote, required transfusion. pt  has taken chronic iron since    Past Surgical History  Procedure Date  . Volvulus reduction [id number]  . Colonoscopy 2009    [facility]-Dr. Karilyn Cota. three polyps removed. Only two were tubular adenomas/adenomas.  . Foot surgery 2011    right    Family History  Problem Relation Age of Onset  . Heart disease Mother     Pacemaker  . Diabetes Father   . Colon cancer Neg Hx     History   Social History  . Marital Status: Married    Spouse Name: N/A    Number of Children: N/A  . Years of Education: N/A   Occupational History  . LABORER     full time   Social History Main Topics  . Smoking status: Never Smoker   . Smokeless tobacco: Not on file  . Alcohol Use: No  . Drug Use: No  . Sexually Active: Not on file   Other Topics Concern  . Not on file   Social History Narrative   No regular exercise      ROS:  General: Negative for anorexia, weight loss, fever, chills, fatigue, weakness. Eyes: Negative for vision changes.  ENT: Negative for hoarseness, difficulty swallowing , nasal congestion. CV: Negative for chest pain, angina,  palpitations, dyspnea on exertion, peripheral edema.  Respiratory: Negative for dyspnea at rest, dyspnea on exertion, cough, sputum, wheezing.  GI: See history of present illness. GU:  Negative for dysuria, hematuria, urinary incontinence, urinary frequency, nocturnal urination.  MS: Negative for joint pain, low back pain.  Derm: Negative for rash or itching.  Neuro: Negative for weakness, abnormal sensation, seizure, frequent headaches, memory loss, confusion.  Psych: Negative for anxiety, depression, suicidal ideation, hallucinations.  Endo: Negative for unusual weight change.  Heme: Negative for bruising or bleeding. Allergy: Negative for rash or hives.    Physical Examination:  BP 132/85  Pulse 77  Temp 97.6 F (36.4 C) (Temporal)  Ht 6\' 2"  (1.88 m)  Wt 288 lb 12.8 oz (130.999 kg)  BMI 37.08 kg/m2   General: Well-nourished,  well-developed in no acute distress. obese Head: Normocephalic, atraumatic.   Eyes: Conjunctiva pink, no icterus. Mouth: Oropharyngeal mucosa moist and pink , no lesions erythema or exudate. Neck: Supple without thyromegaly, masses, or lymphadenopathy.  Lungs: Clear to auscultation bilaterally.  Heart: Regular rate and rhythm, no murmurs rubs or gallops.  Abdomen: Bowel sounds are normal, herniation above the umbilicus. Easily reducible. Large ventral defect plus or minus rectus diastases. Nontender, nondistended, no hepatosplenomegaly or masses, no abdominal bruits, no rebound or guarding.   Rectal: deferred to time of colonoscopy Extremities: No lower extremity edema. No clubbing or deformities.  Neuro: Alert and oriented x 4 , grossly normal neurologically.  Skin: Warm and dry, no rash or jaundice.   Psych: Alert and cooperative, normal mood and affect.   Imaging Studies: No results found.

## 2012-06-03 NOTE — Interval H&P Note (Signed)
History and Physical Interval Note:  06/03/2012 8:22 AM  Brent Mason  has presented today for surgery, with the diagnosis of RECTAL BLEEDING, GERD, ADENOMAS  The various methods of treatment have been discussed with the patient and family. After consideration of risks, benefits and other options for treatment, the patient has consented to  Procedure(s) (LRB): COLONOSCOPY (N/A) as a surgical intervention .  The patient's history has been reviewed, patient examined, no change in status, stable for surgery.  I have reviewed the patient's chart and labs.  Questions were answered to the patient's satisfaction.     [name]

## 2012-06-03 NOTE — Op Note (Signed)
[facility] [address] [place], [address]   COLONOSCOPY PROCEDURE REPORT  PATIENT: Brent Mason, Brent Mason  MR#:         [id number] BIRTHDATE: Feb 26, 1946 , 65  yrs. old GENDER: Male ENDOSCOPIST: R.  Roetta Sessions, MD FACP FACG REFERRED BY:  Artis Delay, M.D. PROCEDURE DATE:  06/03/2012 PROCEDURE:     colonoscopy with snare polypectomy, biopsy  INDICATIONS: history of colonic polyps. Occasional blood per rectum the setting of straining constipation; Coumadin held for 5 days  INFORMED CONSENT:  The risks, benefits, alternatives and imponderables including but not limited to bleeding, perforation as well as the possibility of a missed lesion have been reviewed.  The potential for biopsy, lesion removal, etc. have also been discussed.  Questions have been answered.  All parties agreeable. Please see the history and physical in the medical record for more information.  MEDICATIONS: Versed 5 mg IV and Demerol 75 mg IV in divided doses  DESCRIPTION OF PROCEDURE:  After a digital rectal exam was performed, the     colonoscope was advanced from the anus through the rectum and colon to the area of the cecum, ileocecal valve and appendiceal orifice.  The cecum was deeply intubated.  These structures were well-seen and photographed for the record.  From the level of the cecum and ileocecal valve, the scope was slowly and cautiously withdrawn.  The mucosal surfaces were carefully surveyed utilizing scope tip deflection to facilitate fold flattening as needed.  The scope was pulled down into the rectum where a thorough examination including retroflexion was performed.     FINDINGS:  Marginal preparation. Elongated tortuous colon. Scattered pancolonic diverticula. Patient had a single 5mm polyp in the ascending segment. There was a second diminutive polyp at the splenic flexure. Diffusely pigmented colonic mucosa consistent with melanosis coli. The remainder of the rectum  and colon appeared unremarkable aside from a friable anal canal.  THERAPEUTIC / DIAGNOSTIC MANEUVERS PERFORMED:  the above-mentioned polyps were cold snared and cold biopsied, respectively  COMPLICATIONS: none  CECAL WITHDRAWAL TIME:  10 minutes  IMPRESSION:  Friable anal canal-likely source of hematochezia. Melanosis coli. Pancolonic diverticulosis. Colonic polyps-removed as described above  RECOMMENDATIONS: Followup on pathology. Daily fiber supplement. MiraLax at bedtime.   _______________________________ eSigned:  R. Roetta Sessions, MD FACP [facility] 06/03/2012 9:13 AM   CC:

## 2012-06-06 ENCOUNTER — Encounter (HOSPITAL_COMMUNITY): Payer: Self-pay | Admitting: Internal Medicine

## 2012-06-09 ENCOUNTER — Encounter: Payer: Self-pay | Admitting: Internal Medicine

## 2012-06-10 ENCOUNTER — Encounter: Payer: Self-pay | Admitting: *Deleted

## 2012-06-19 ENCOUNTER — Ambulatory Visit (INDEPENDENT_AMBULATORY_CARE_PROVIDER_SITE_OTHER): Payer: Medicare Other | Admitting: *Deleted

## 2012-06-19 DIAGNOSIS — I4891 Unspecified atrial fibrillation: Secondary | ICD-10-CM

## 2012-06-19 DIAGNOSIS — Z7901 Long term (current) use of anticoagulants: Secondary | ICD-10-CM

## 2012-06-19 LAB — POCT INR: INR: 2.8

## 2012-07-17 ENCOUNTER — Ambulatory Visit (INDEPENDENT_AMBULATORY_CARE_PROVIDER_SITE_OTHER): Payer: Medicare Other | Admitting: *Deleted

## 2012-07-17 DIAGNOSIS — Z7901 Long term (current) use of anticoagulants: Secondary | ICD-10-CM

## 2012-07-17 DIAGNOSIS — I4891 Unspecified atrial fibrillation: Secondary | ICD-10-CM

## 2012-07-26 ENCOUNTER — Other Ambulatory Visit (HOSPITAL_COMMUNITY): Payer: Self-pay | Admitting: Internal Medicine

## 2012-07-26 ENCOUNTER — Ambulatory Visit (HOSPITAL_COMMUNITY)
Admission: RE | Admit: 2012-07-26 | Discharge: 2012-07-26 | Disposition: A | Discharge status: Home / Self Care | Payer: Medicare Other | Source: Ambulatory Visit | Attending: Internal Medicine | Admitting: Internal Medicine

## 2012-07-26 DIAGNOSIS — S139XXA Sprain of joints and ligaments of unspecified parts of neck, initial encounter: Secondary | ICD-10-CM

## 2012-07-26 DIAGNOSIS — M47812 Spondylosis without myelopathy or radiculopathy, cervical region: Secondary | ICD-10-CM | POA: Insufficient documentation

## 2012-07-26 DIAGNOSIS — M5030 Other cervical disc degeneration, unspecified cervical region: Secondary | ICD-10-CM | POA: Insufficient documentation

## 2012-08-05 ENCOUNTER — Other Ambulatory Visit (HOSPITAL_COMMUNITY): Payer: Self-pay | Admitting: Urology

## 2012-08-05 DIAGNOSIS — N400 Enlarged prostate without lower urinary tract symptoms: Secondary | ICD-10-CM

## 2012-08-05 DIAGNOSIS — R972 Elevated prostate specific antigen [PSA]: Secondary | ICD-10-CM

## 2012-08-07 ENCOUNTER — Other Ambulatory Visit (HOSPITAL_COMMUNITY): Payer: Medicare Other

## 2012-08-12 ENCOUNTER — Ambulatory Visit (HOSPITAL_COMMUNITY)
Admission: RE | Admit: 2012-08-12 | Discharge: 2012-08-12 | Disposition: A | Discharge status: Home / Self Care | Payer: Medicare Other | Source: Ambulatory Visit | Attending: Urology | Admitting: Urology

## 2012-08-12 DIAGNOSIS — R319 Hematuria, unspecified: Secondary | ICD-10-CM | POA: Insufficient documentation

## 2012-08-12 DIAGNOSIS — R972 Elevated prostate specific antigen [PSA]: Secondary | ICD-10-CM | POA: Insufficient documentation

## 2012-08-12 DIAGNOSIS — N400 Enlarged prostate without lower urinary tract symptoms: Secondary | ICD-10-CM

## 2012-08-12 DIAGNOSIS — K5730 Diverticulosis of large intestine without perforation or abscess without bleeding: Secondary | ICD-10-CM | POA: Insufficient documentation

## 2012-08-12 DIAGNOSIS — K429 Umbilical hernia without obstruction or gangrene: Secondary | ICD-10-CM | POA: Insufficient documentation

## 2012-08-12 DIAGNOSIS — K8020 Calculus of gallbladder without cholecystitis without obstruction: Secondary | ICD-10-CM | POA: Insufficient documentation

## 2012-08-12 MED ORDER — IOHEXOL 300 MG/ML  SOLN
125.0000 mL | Freq: Once | INTRAMUSCULAR | Status: AC | PRN
  Administered 2012-08-12: 125 mL via INTRAVENOUS

## 2012-08-28 ENCOUNTER — Ambulatory Visit (INDEPENDENT_AMBULATORY_CARE_PROVIDER_SITE_OTHER): Payer: Medicare Other | Admitting: *Deleted

## 2012-08-28 DIAGNOSIS — Z7901 Long term (current) use of anticoagulants: Secondary | ICD-10-CM

## 2012-08-28 DIAGNOSIS — I4891 Unspecified atrial fibrillation: Secondary | ICD-10-CM

## 2012-09-10 ENCOUNTER — Encounter: Payer: Self-pay | Admitting: Orthopedic Surgery

## 2012-09-10 ENCOUNTER — Telehealth: Payer: Self-pay | Admitting: Orthopedic Surgery

## 2012-09-10 ENCOUNTER — Ambulatory Visit (INDEPENDENT_AMBULATORY_CARE_PROVIDER_SITE_OTHER): Payer: Medicare Other | Admitting: Orthopedic Surgery

## 2012-09-10 DIAGNOSIS — M47812 Spondylosis without myelopathy or radiculopathy, cervical region: Secondary | ICD-10-CM | POA: Insufficient documentation

## 2012-09-10 MED ORDER — HYDROCODONE-ACETAMINOPHEN 5-325 MG PO TABS: 1.0000 | ORAL_TABLET | Freq: Four times a day (QID) | ORAL | Status: DC | PRN

## 2012-09-10 NOTE — Telephone Encounter (Signed)
On coumadin no nsaids   Brent Mason ok

## 2012-09-10 NOTE — Telephone Encounter (Signed)
I scheduled Brent Mason an appointment with Dr. Lovell Sheehan for 09/19/12 at 11.00.  He is aware of the appointment. Will fax notes, demographics.

## 2012-09-10 NOTE — Telephone Encounter (Signed)
Carla advised

## 2012-09-10 NOTE — Progress Notes (Signed)
Patient ID: Brent Mason, male   DOB: 12-14-1945, 66 y.o.   MRN: [id number] Chief Complaint  Patient presents with  . Neck Pain    3 months, stiffness, and pain right-sided cervical spine  . Mass    RIGHT side of head, posterior to the ear x2 months     This is a 66 year old male with a history of a recent mass on the RIGHT side of his head, which he associates with some pain in his cervical spine radiating to his RIGHT shoulder and stiffness in his neck. The neck pain has been present for the last 3 months. It appears to me that mass on his head is not related and may be a sebaceous cyst, which she will need general surgical evaluation.  His neck pain, came on suddenly. He describes sharp, stabbing 10 out of 10. Intermittently in the cervical spine associated with any rotation. He has lost range of motion cervical spine. His pain is relieved by bile crease worsened with movement. However, he denies numbness, tingling, weakness or gait disturbance.  Review of systems has a history of atrial fibrillation, shortness of breath, wheezing cough, snoring, constipation, frequency, urgency, hematuria, dysuria. Easy bleeding and excessive urination.  His other systems are negative.  Past Medical History  Diagnosis Date  . Hypertension   . Esophageal stricture     several times and food impaction once per patient [name])  . Dizziness   . A-fib   . Anemia     remote, required transfusion. pt has taken chronic iron since    Past Surgical History  Procedure Date  . Volvulus reduction [id number]  . Colonoscopy 2009    [facility]-Dr. Karilyn Cota. three polyps removed. Only two were tubular adenomas/adenomas.  . Foot surgery 2011    right  . Colonoscopy 06/03/2012    Procedure: COLONOSCOPY;  Surgeon: Corbin Ade, MD;  Location: [facility];  Service: Endoscopy;  Laterality: N/A;  8:30    Physical Exam(12)  Vital signs:   GENERAL: normal development   CDV: pulses are normal   Skin:  normal  Lymph: nodes were not palpable/normal  Psychiatric: awake, alert and oriented  Neuro: normal sensation  MSK  Gait: He has no evidence of ataxic gait. 1 Inspection Cervical spinous tender, especially on the RIGHT side, and somewhat on the midline. All so, in the trapezius muscle. The RIGHT shoulder 2 Range of Motion Cervical range of motion is decreased and diminished especially with rotation, but flexion and extension are also diminished. 3 Motor Upper extremity. Motor exam is normal 4 Stability Of the joints of the upper extremity, normal  Cranial exam.  There is a mass on the RIGHT side of the cranium behind the ear, which appears to be palpably and visibly bigger than the portion of the skull, which corresponds to the opposite side. It is firm. It is approximately 3 cm x 2 cm.   Imaging The grafts were done at the hospital and he has severe spondylosis of the cervical spine  Assessment: Cervical spondylosis without myelopathy    Plan: Physical therapy, Norco for pain. He was taking a half tablet without relief. He is encouraged to take a full tablet.  We are recommending that he seek emergency treatment if he has gait abnormality, weakness, numbness, t, and, as this would be a surgical emergency, indicating cervical myelopathy.  Consult Dr. Malvin Johns Mass, RIGHT side of the head   CC: DR [facility]

## 2012-09-10 NOTE — Telephone Encounter (Signed)
ADDITIONAL NOTE:  Mr. Zinter daughter, [name] asked if her Dad can go to Dr. Malvin Johns instead of Dr. Lovell Sheehan.  Said he is more familiar with Dr. Malvin Johns.  2.  Asked if you prescribed an anti-inflammatory  for Mr. Filak for the arthritis.  Her Dad told her you only prescribed the Hydrocodone.  He uses [facility]  He has signed a HIPPA form giving permission to speak with her.  Her cell # [phone]

## 2012-09-10 NOTE — Patient Instructions (Addendum)
START PT AT [facility] CALL THEM FOR APPOINTMENT   TAKE PAIN MEDICATION AS ORDERED   CERVICAL SPONDYLOSIS (ARTHRITIS)  SYMPTOMS TO WATCH FOR: NUMBNESS TINGLING STUMBLING  WEAKNESS IN THE HAND

## 2012-09-16 ENCOUNTER — Ambulatory Visit (HOSPITAL_COMMUNITY)
Admission: RE | Admit: 2012-09-16 | Discharge: 2012-09-16 | Disposition: A | Discharge status: Home / Self Care | Payer: Medicare Other | Source: Ambulatory Visit | Attending: Orthopedic Surgery | Admitting: Orthopedic Surgery

## 2012-09-16 ENCOUNTER — Telehealth: Payer: Self-pay | Admitting: Orthopedic Surgery

## 2012-09-16 DIAGNOSIS — M47812 Spondylosis without myelopathy or radiculopathy, cervical region: Secondary | ICD-10-CM | POA: Diagnosis present

## 2012-09-16 DIAGNOSIS — M6281 Muscle weakness (generalized): Secondary | ICD-10-CM | POA: Insufficient documentation

## 2012-09-16 DIAGNOSIS — IMO0001 Reserved for inherently not codable concepts without codable children: Secondary | ICD-10-CM | POA: Insufficient documentation

## 2012-09-16 DIAGNOSIS — M542 Cervicalgia: Secondary | ICD-10-CM | POA: Insufficient documentation

## 2012-09-16 NOTE — Telephone Encounter (Signed)
Scheduled [name] for consult with Dr. Malvin Johns for 09/24/12 at 3:30.  Advised his daughter, [name] of the appointment date/time. Originally was scheduled with Dr. Lovell Sheehan, family requested change to Dr. Malvin Johns. Dr. Lovell Sheehan appointment cancelled with Jan at his office

## 2012-09-16 NOTE — Evaluation (Signed)
Physical Therapy Evaluation  Patient Details  Name: Brent Mason MRN: [id number] Date of Birth: 1946-09-17  Today's Date: 09/16/2012 Time: 4098-1191 PT Time Calculation (min): 46 min Charges: 1 eval, 10' manual Visit#: 1  of 12   Re-eval: 10/16/12 Assessment Diagnosis: Cervical spondylosis Next MD Visit: Dr. Romeo Apple - 10/31/11  Authorization: MEDICARE  Authorization Time Period:    Authorization Visit#: 1  of 10    Past Medical History:  Past Medical History  Diagnosis Date  . Hypertension   . Esophageal stricture     several times and food impaction once per patient [name])  . Dizziness   . A-fib   . Anemia     remote, required transfusion. pt has taken chronic iron since   Past Surgical History:  Past Surgical History  Procedure Date  . Volvulus reduction [id number]  . Colonoscopy 2009    [facility]-Dr. Karilyn Cota. three polyps removed. Only two were tubular adenomas/adenomas.  . Foot surgery 2011    right  . Colonoscopy 06/03/2012    Procedure: COLONOSCOPY;  Surgeon: Corbin Ade, MD;  Location: [facility];  Service: Endoscopy;  Laterality: N/A;  8:30    Subjective Symptoms/Limitations Pertinent History: Pt is referred to PT for neck pain which he reports started about 3-4 months ago. He states he has tried chiropractic work and helped some.  He then sought help from Dr. Romeo Apple who perscribed PT and pain medication.  his c/co is inability to turn his head to the Right and  makes it difficult to turn while driving.  Patient Stated Goals: "I want to be able to turn my neck again so I can drive."  Pain Assessment Currently in Pain?: Yes Pain Score:   9 (w/movment) Pain Location: Neck Pain Orientation: Right Pain Type: Acute pain  Prior Functioning  Prior Function Driving: Yes (difficulty turning head to the R) Vocation: Retired Comments: he enjoys going to the gym when he is able  Sensation/Coordination/Flexibility/Functional Tests Functional  Tests Functional Tests: NDI: 62%  Assessment RUE AROM (degrees) RUE Overall AROM Comments: Cervical pain at end range of shoulder: flexion, abd, and ER (decreased by 20% in each direction)  Cervical AROM Overall Cervical AROM Comments: taken in Supine Cervical Flexion: WNL Cervical Extension: Decreased 90% Cervical - Right Side Bend: 16.5 cm Cervical - Left Side Bend: 15 cm - pain to R Cervical - Right Rotation: 19 cm - pain to R Cervical - Left Rotation: 14 cm  Cervical Strength Cervical Flexion: 3/5 (pain) Cervical Extension: 3+/5 Cervical - Right Side Bend: 3/5 Cervical - Left Side Bend: 3/5 Cervical - Right Rotation: 3/5 Cervical - Left Rotation: 3/5  Palpation: significant pain and tenderness with spasms to R middle scalene; decreased AP mobility to R C4-C6 TP   Mobility/Balance  Posture/Postural Control Posture/Postural Control: Postural limitations Postural Limitations: upper cross syndrome   Exercise/Treatments Stretches Upper Trapezius Stretch: 1 rep;30 seconds (L SB only) Levator Stretch: 1 rep;30 seconds (L rotation) Seated Exercises Neck Retraction: 5 reps Postural Training: w/TC and VC for approipriate posture. Other Seated Exercise: Scap retraction x5 Supine Exercises Neck Retraction: 5 reps Manual Therapy Manual Therapy: Joint mobilization Joint Mobilization: Grade I-III to C5-6 R TP AP and PA direction to decrease pain and improve mobility.  Myofascial Release: supine and seated to R mid scalene to decrease fascial restrictions with STM after  Physical Therapy Assessment and Plan PT Assessment and Plan Clinical Impression Statement: Pt is a 66 year old male referred to PT for cervical  spondyolisis w/acute cervical pain.  Pt will benefit from skilled therapeutic intervention in order to improve on the following deficits: Improper body mechanics;Increased muscle spasms;Decreased strength;Decreased range of motion;Decreased mobility;Pain Rehab Potential:  Good PT Frequency: Min 3X/week PT Duration: 4 weeks PT Treatment/Interventions: Functional mobility training;Therapeutic activities;Therapeutic exercise;Neuromuscular re-education;Patient/family education;Manual techniques;Modalities PT Plan: Continue with manual techniques to decrease R mid scalene pain.  Educated and instruct on proper posture.  Add: UBE, t-band activities, corner stretch.  Emphasis proper posture with all techniques.  May add modalities for pain control.     Goals Home Exercise Program Pt will Perform Home Exercise Program: Independently PT Goal: Perform Home Exercise Program - Progress: Goal set today PT Short Term Goals Time to Complete Short Term Goals: 2 weeks PT Short Term Goal 1: Pt will report pain less than 5/10 with R cervical rotation.  PT Short Term Goal 2: Pt will improve R cervical rotation and SB to the same as L rotation and SB PT Short Term Goal 3: Pt will improve cervical strength by 1 muscle grade.  PT Short Term Goal 4: Pt will demonstrate shoulder AROM WNL.  PT Short Term Goal 5: Pt will present with minimal R cervical spasms.  PT Long Term Goals Time to Complete Long Term Goals: 4 weeks PT Long Term Goal 1: Pt will report pain less than 4/10 with cervical and shoulder motions to improve QOL.  PT Long Term Goal 2: Pt will improve NDI to less than 40% for improved QOL>  Long Term Goal 3: Pt will improve cervical rotation to WNL in order to turn head in the car while driving.   Problem List Patient Active Problem List  Diagnosis  . MORBID OBESITY  . HYPERTENSION  . Atrial fibrillation  . ESOPHAGEAL STRICTURE  . DIZZINESS  . DYSPNEA  . HYPERLIPIDEMIA  . Long term current use of anticoagulant  . Hx of adenomatous colonic polyps  . Rectal bleeding  . GERD (gastroesophageal reflux disease)  . Cervical spondylosis without myelopathy  . Neck pain    PT Plan of Care PT Home Exercise Plan: see scanned report.  PT Patient Instructions: discussed  importance of HEP and posture Consulted and Agree with Plan of Care: Patient  GP Functional Limitation: Self care Self Care Current Status [phone]): At least 60 percent but less than 80 percent impaired, limited or restricted Self Care Goal Status ([id number]): At least 20 percent but less than 40 percent impaired, limited or restricted  Sharra Cayabyab, PT 09/16/2012, 3:12 PM  Physician Documentation Your signature is required to indicate approval of the treatment plan as stated above.  Please sign and either send electronically or make a copy of this report for your files and return this physician signed original.   Please mark one 1.__approve of plan  2. ___approve of plan with the following conditions.   ______________________________                                                          _____________________ Physician Signature  Date  

## 2012-09-18 ENCOUNTER — Ambulatory Visit (HOSPITAL_COMMUNITY)
Admission: RE | Admit: 2012-09-18 | Discharge: 2012-09-18 | Disposition: A | Discharge status: Home / Self Care | Payer: Medicare Other | Source: Ambulatory Visit | Attending: Orthopedic Surgery | Admitting: Orthopedic Surgery

## 2012-09-18 NOTE — Progress Notes (Signed)
Physical Therapy Treatment Patient Details  Name: Brent Mason MRN: [id number] Date of Birth: [birth year]  Today's Date: 09/18/2012 Time: 1350-1425 PT Time Calculation (min): 35 min  Visit#: 2  of 12   Re-eval: 10/16/12 Authorization: MEDICARE  Authorization Visit#: 2  of 10   Charges:  therex 20', massage 12'  Subjective: Symptoms/Limitations Symptoms: Pt. states he doesn't hurt until he turns his head to the right, then increases to 7/10.   Exercise/Treatments Stretches Upper Trapezius Stretch: 2 reps;30 seconds Levator Stretch: 2 reps;30 seconds Corner Stretch: 3 reps;30 seconds Machines for Strengthening UBE (Upper Arm Bike): 4' backward Theraband Exercises Scapula Retraction: 10 reps;Green Shoulder Extension: 10 reps;Green Rows: 10 reps;Green Seated Exercises Neck Retraction: 10 reps Postural Training: w/TC and VC for approipriate posture. Other Seated Exercise: Scap retraction x10   Manual Therapy Manual Therapy: Massage Massage: R cervical; sternocleidomastoid and trap muscles in seated position  Physical Therapy Assessment and Plan PT Assessment and Plan Clinical Impression Statement: Began new exercises per POC.  Pt. required multimodal cues to perform theraband ex correctly.  Continues to require cues for posture correction.  Tight mm band R sternocleidomastoid that resolved 75% with STM.  Noted improvement in ROM with rotation to the R at end of session, however no decrease in symptoms when turning head to right.. PT Plan: Continue per POC.  Continue to educate on posture while increasing strength and ROM/reducing pain.     Problem List Patient Active Problem List  Diagnosis  . MORBID OBESITY  . HYPERTENSION  . Atrial fibrillation  . ESOPHAGEAL STRICTURE  . DIZZINESS  . DYSPNEA  . HYPERLIPIDEMIA  . Long term current use of anticoagulant  . Hx of adenomatous colonic polyps  . Rectal bleeding  . GERD (gastroesophageal reflux disease)  . Cervical  spondylosis without myelopathy  . Neck pain    PT - End of Session Activity Tolerance: Patient tolerated treatment well General Behavior During Session: [facility] for tasks performed Cognition: [facility] for tasks performed PT Plan of Care PT Home Exercise Plan: see scanned report.  PT Patient Instructions: discussed importance of HEP and posture Consulted and Agree with Plan of Care: Patient   Lurena Nida, PTA/CLT 09/18/2012, 2:32 PM

## 2012-09-20 ENCOUNTER — Ambulatory Visit (HOSPITAL_COMMUNITY): Payer: Medicare Other | Admitting: Physical Therapy

## 2012-09-25 ENCOUNTER — Ambulatory Visit (HOSPITAL_COMMUNITY)
Admission: RE | Admit: 2012-09-25 | Discharge: 2012-09-25 | Disposition: A | Discharge status: Home / Self Care | Payer: Medicare Other | Source: Ambulatory Visit | Attending: Orthopedic Surgery | Admitting: Orthopedic Surgery

## 2012-09-25 ENCOUNTER — Ambulatory Visit (INDEPENDENT_AMBULATORY_CARE_PROVIDER_SITE_OTHER): Payer: Medicare Other | Admitting: *Deleted

## 2012-09-25 DIAGNOSIS — Z7901 Long term (current) use of anticoagulants: Secondary | ICD-10-CM

## 2012-09-25 DIAGNOSIS — I4891 Unspecified atrial fibrillation: Secondary | ICD-10-CM

## 2012-09-25 NOTE — Progress Notes (Addendum)
Physical Therapy Treatment Patient Details  Name: Brent Mason MRN: [id number] Date of Birth: [birth year]  Today's Date: 09/25/2012 Time: 1350-1446 PT Time Calculation (min): 56 min Charges: 30' [place], 10' NMR, 1 estim, 1 heat Visit#: 3  of 12   Re-eval: 10/16/12    Authorization: MEDICARE  Authorization Time Period:    Authorization Visit#: 3  of 10    Subjective: Symptoms/Limitations Symptoms: I haven'tbeen here in a week and I am a little more stiff.   Precautions/Restrictions     Exercise/Treatments  Seated Exercises Neck Retraction: 10 reps Postural Training: w/TC and VC for proper posture  Other Seated Exercise: Scap retraction x10 Supine Exercises Neck Retraction: 10 reps;Limitations Neck Retraction Limitations: capital flexion 2x5 w/5 sec holds  Modalities Modalities: Electrical Stimulation;Moist Heat Manual Therapy Manual Therapy: Joint mobilization Joint Mobilization: Grade I-III to C3-T2 SP and R TP AP and PA direction to decrease pain and improve mobility w/STM after Myofascial Release: supine to mid and anterior scalene to decrease fascial restrictions  Moist Heat Therapy Number Minutes Moist Heat: 15 Minutes Moist Heat Location:  (neck)Moist Heat Therapy Electrical Stimulation Electrical Stimulation Location: IFES to R UT x15 minutes Electrical Stimulation Action: IFES  Electrical Stimulation Parameters: 80/150 hz  Electrical Stimulation Goals: Pain;Other (comment) (improve ROM)  Physical Therapy Assessment and Plan PT Assessment and Plan Clinical Impression Statement: focused on decreasing fascial restrictions and improving ROM and cervical strength. Pt continues to have significant fascial restrictions which is decreases minimally after manual therapy.  Demonstrates significant weakness to anterior cervical musculature.  Discussed importance of mainitaining appropriate posture to decrease pain and encouraged to continue with cervical rotation to R  and decrease trunk rotation to decrease secondary injuries.  PT Plan: Continue to decrease pain and improve function, using manual and TE techniques.  F/u on e-stim and heat.     Goals    Problem List Patient Active Problem List  Diagnosis  . MORBID OBESITY  . HYPERTENSION  . Atrial fibrillation  . ESOPHAGEAL STRICTURE  . DIZZINESS  . DYSPNEA  . HYPERLIPIDEMIA  . Long term current use of anticoagulant  . Hx of adenomatous colonic polyps  . Rectal bleeding  . GERD (gastroesophageal reflux disease)  . Cervical spondylosis without myelopathy  . Neck pain    PT - End of Session Activity Tolerance: Patient tolerated treatment well General Behavior During Session: [facility] for tasks performed Cognition: [facility] for tasks performed PT Plan of Care PT Home Exercise Plan: see scanned report.  PT Patient Instructions: discussed importance of HEP and posture Consulted and Agree with Plan of Care: Patient  GP    Kayen Grabel 09/25/2012, 2:43 PM

## 2012-09-26 NOTE — Consult Note (Signed)
NAME:  Brent Mason, Brent Mason NO.:  [id number]  MEDICAL RECORD NO.:  [id number]  LOCATION:  [facility]                           FACILITY:  [facility]  PHYSICIAN:  Barbaraann Barthel, M.D. DATE OF BIRTH:  [birth year]  DATE OF CONSULTATION:  09/25/2012 DATE OF DISCHARGE:  09/25/2012                                CONSULTATION   Dear Dr. Romeo Apple:  I saw Brent Mason in my office on September 24, 2012 at which time I evaluated a mass on the right side of his cranium behind his right ear. In essence, I felt that this was essentially a asymmetrical subcutaneous fatty tissue and in my opinion there were no general surgical findings here.  He did have some complaints of cervical pain, particularly his right trapezius muscle distribution.  I did suggest heat and a TENS unit.  I will be happy to follow up with him should there be any acute changes.  Incidentally I did check his right ear where back in August of this month I excised a well differentiated squamous cell carcinoma. There was no sign of any recurrence in this area.  I would be happy to see him again if are any acute changes and as always, I appreciate your confidence in sending your patients my way.     Barbaraann Barthel, M.D.     WB/MEDQ  D:  09/25/2012  T:  09/26/2012  Job:  [id number]  cc:   Vickki Hearing, M.D. Fax: [phone]

## 2012-09-27 ENCOUNTER — Ambulatory Visit (HOSPITAL_COMMUNITY)
Admission: RE | Admit: 2012-09-27 | Discharge: 2012-09-27 | Disposition: A | Discharge status: Home / Self Care | Payer: Medicare Other | Source: Ambulatory Visit | Attending: Orthopedic Surgery | Admitting: Orthopedic Surgery

## 2012-09-27 NOTE — Progress Notes (Signed)
Physical Therapy Treatment Patient Details  Name: Brent Mason MRN: [id number] Date of Birth: [birth year]  Today's Date: 09/27/2012 Time: 7829-5621 PT Time Calculation (min): 53 min Charges: 25' NMR, 10' TE, 1 unattended e-stim  Visit#: 4  of 12   Re-eval: 10/16/12    Authorization: MEDICARE  Authorization Time Period:    Authorization Visit#: 4  of 10    Subjective: Symptoms/Limitations Symptoms: Pt reports that he just came off of the heating pad.  reports he liked the e-stim at the end.  discussed with pt importance to continue with strengthening as heat and e-stim are temporary relief.  Pain Assessment Currently in Pain?: Yes Pain Score:   7 (w/turning to R) Pain Location: Neck Pain Orientation: Right  Precautions/Restrictions     Exercise/Treatments Mobility/Balance        Stretches   Machines for Strengthening UBE (Upper Arm Bike): 6' backwards for strength Theraband Exercises Scapula Retraction: 10 reps;Green (w/TC and VC for scapular retraction) Shoulder Extension: 10 reps;Green (w/TC and VC for scapular retraction) Rows: 10 reps;Green (w/TC and VC for scapular retraction) Standing Exercises Upper Extremity Flexion with Stabilization: Flexion;10 reps;Limitations UE Flexion with Stabilization Limitations: B UE w/TC for appropriate posture and cervical alingment (neutral spine) Other Standing Exercises: pro/ret/dep/elev x10 w/TC and VC for SA activation Other Standing Exercises: cervical R rotation w/stabilization x10 Seated Exercises Shoulder Rolls: Backwards;20 reps;Limitations Shoulder Rolls Limitations: TC and VC for apporpriate coordinated movements Supine Exercises Cervical Rotation: Right;10 reps (w/estim and 30 sec holds after) Sidelying Exercises   Prone Exercises   Hand Exercises for Cervical Radiculopathy    Modalities Modalities: Electrical Stimulation;Moist Heat Manual Therapy Manual Therapy: Joint mobilization Moist Heat  Therapy Number Minutes Moist Heat: 10 Minutes Moist Heat Location:  (neck) Electrical Stimulation Electrical Stimulation Location: IFES to R UT and scalene region w/exercise to improve ROM, then for pain reduction.  x8 minutes for exercise, x10 min for pain Electrical Stimulation Action: IFES  Electrical Stimulation Parameters: 80/150 hz  Electrical Stimulation Goals: Pain;Other (comment) (improve ROM)  Physical Therapy Assessment and Plan PT Assessment and Plan Clinical Impression Statement: Pt demonstrates increased difficulty with coordinated movements.  has significant weakness and coordinattion deficits to cervical and scapular region which is likely keeping his neck in slightly tilted to the left postion with forward head posture.  Required max multimodal cueing to sustain appropriate posture and positioning.  Used e-stim to improve R cervical rotaion to reduce pain.  Pt had notable cavitations with standing cervical rotaiton to the R.   PT Plan: continue to decrease pain and improve overall posture. Continue to address coordinated movements.  Add corner press next visit.     Goals    Problem List Patient Active Problem List  Diagnosis  . MORBID OBESITY  . HYPERTENSION  . Atrial fibrillation  . ESOPHAGEAL STRICTURE  . DIZZINESS  . DYSPNEA  . HYPERLIPIDEMIA  . Long term current use of anticoagulant  . Hx of adenomatous colonic polyps  . Rectal bleeding  . GERD (gastroesophageal reflux disease)  . Cervical spondylosis without myelopathy  . Neck pain    PT - End of Session Activity Tolerance: Patient tolerated treatment well General Behavior During Session: [facility] for tasks performed Cognition: [facility] for tasks performed PT Plan of Care PT Home Exercise Plan: see scanned report.  PT Patient Instructions: discussed importance of HEP and posture Consulted and Agree with Plan of Care: Patient  Annett Fabian, PT 09/27/2012, 2:32 PM

## 2012-09-30 ENCOUNTER — Ambulatory Visit (HOSPITAL_COMMUNITY)
Admission: RE | Admit: 2012-09-30 | Discharge: 2012-09-30 | Disposition: A | Discharge status: Home / Self Care | Payer: Medicare Other | Source: Ambulatory Visit | Attending: Orthopedic Surgery | Admitting: Orthopedic Surgery

## 2012-09-30 NOTE — Progress Notes (Signed)
Physical Therapy Treatment Patient Details  Name: Brent Mason MRN: [id number] Date of Birth: 1946-07-30  Today's Date: 09/30/2012 Time: 0925-1033 PT Time Calculation (min): 68 min  Visit#: 5  of 12   Re-eval: 10/16/12  Charge: Manual 12', IFES/MHP x18', therex 53'  Authorization: Medicare  Authorization Time Period:    Authorization Visit#: 5  of 10    Subjective: Symptoms/Limitations Symptoms: Pt reported he feels like he slept wrong last night, R cervical pain scale 8/10. Pain Assessment Currently in Pain?: Yes Pain Score:   8 Pain Location: Neck Pain Orientation: Right  Objective:   Exercise/Treatments Stretches Upper Trapezius Stretch: 3 reps;30 seconds Machines for Strengthening UBE (Upper Arm Bike): 6' backwards for strength Theraband Exercises Scapula Retraction: 10 reps;Green (w/ VC and TC for scapular retraction) Shoulder Extension: 10 reps;Green (w/ VC and TC for scapular retraction) Rows: 10 reps;Green (w/ VC and TC for scapular retraction) Standing Exercises Upper Extremity Flexion with Stabilization: Flexion;10 reps;Limitations UE Flexion with Stabilization Limitations: B UE w/TC for appropriate posture and cervical alingment (neutral spine) Other Standing Exercises: pro/ret/dep/elev x10 w/TC and VC for SA activation Other Standing Exercises: cervical R rotation w/stabilization x10; elbow press into corner 5x 10" Seated Exercises Cervical Rotation: Both;10 reps Lateral Flexion: Both;10 reps Shoulder Rolls: Backwards;20 reps;Limitations Shoulder Rolls Limitations: TC and VC for apporpriate coordinated movements Supine Exercises Cervical Rotation: Right;10 reps  Modalities Modalities: Electrical Stimulation;Moist Heat Manual Therapy Manual Therapy: Other (comment) Massage: R cervical SCM and upper trap in supine position Other Manual Therapy: Suboccipitial release and cervical traction to decrease fascial restrictions x 10' Development worker, international [employer] Stimulation Location: IFES to R UT and scalene region w/ ROM exercises then for pain reduction  x 8 min with exercise and 10' for pain reduction following. Electrical Stimulation Action: IFES to reduce pain Electrical Stimulation Parameters: IFES hi lo sweep 13V Electrical Stimulation Goals: Pain;Other (comment) (improve ROM)  Physical Therapy Assessment and Plan PT Assessment and Plan Clinical Impression Statement: Pt continues to require max multimodal cueing to sustain appropriate posture and correct musculature activation.  Added elbow corner press for lower trap activation to improve posture with max cueing for proper technique.  Manual suboccipital release and manual cervical traction complete prior IFES with MHP for pain reduction,   Pt with very tight facial restrictions.  Pt reported pain decrease by 2 levels at end of session. PT Plan: continue to decrease pain and improve overall posture. Continue to address coordinated movements    Goals    Problem List Patient Active Problem List  Diagnosis  . MORBID OBESITY  . HYPERTENSION  . Atrial fibrillation  . ESOPHAGEAL STRICTURE  . DIZZINESS  . DYSPNEA  . HYPERLIPIDEMIA  . Long term current use of anticoagulant  . Hx of adenomatous colonic polyps  . Rectal bleeding  . GERD (gastroesophageal reflux disease)  . Cervical spondylosis without myelopathy  . Neck pain    PT - End of Session Activity Tolerance: Patient tolerated treatment well General Behavior During Session: [facility] for tasks performed Cognition: [facility] for tasks performed  GP    Juel Burrow 09/30/2012, 1:10 PM

## 2012-10-02 ENCOUNTER — Ambulatory Visit (HOSPITAL_COMMUNITY)
Admission: RE | Admit: 2012-10-02 | Discharge: 2012-10-02 | Disposition: A | Discharge status: Home / Self Care | Payer: Medicare Other | Source: Ambulatory Visit | Attending: Orthopedic Surgery | Admitting: Orthopedic Surgery

## 2012-10-02 NOTE — Progress Notes (Signed)
Physical Therapy Treatment Patient Details  Name: Brent Mason MRN: [id number] Date of Birth: 18-Jun-1946  Today's Date: 10/02/2012 Time: 9562-1308 PT Time Calculation (min): 57 min  Visit#: 6  of 12   Re-eval: 10/16/12 Authorization: Medicare  Authorization Visit#: 6  of 10   Charges:  therex 28', manual 12', cervical traction 12', MHP X 1 unit  Subjective: Symptoms/Limitations Symptoms: Pt. states his pain remains a 8/10 today Pain Assessment Currently in Pain?: Yes Pain Score:   8 Pain Location: Neck Pain Orientation: Right   Exercise/Treatments Machines for Strengthening UBE (Upper Arm Bike): 6' backwards for strength Theraband Exercises Scapula Retraction: 15 reps;Green Shoulder Extension: 15 reps;Green Rows: 15 reps;Green Standing Exercises Upper Extremity Flexion with Stabilization: Flexion;10 reps;Limitations UE Flexion with Stabilization Limitations: B UE w/TC for appropriate posture and cervical alingment (neutral spine) Other Standing Exercises: elbow press into corner 10x 10" Seated Exercises Cervical Rotation: Both;10 reps Lateral Flexion: Both;10 reps Shoulder Rolls: Backwards;20 reps;Limitations Shoulder Rolls Limitations: TC and VC for apporpriate coordinated movements Supine Exercises Cervical Rotation: Right;10 reps    Modalities Modalities: Moist Heat;Traction Manual Therapy Manual Therapy: Massage Massage: R cervical SCM and upper trap mm in seated position with AA/PROM into R rotation Moist Heat Therapy Number Minutes Moist Heat: 12 Minutes Moist Heat Location:  (cervical) Electrical Stimulation Electrical Stimulation Goals:  (improve ROM) Traction Type of Traction: Cervical Min (lbs): n/a Max (lbs): 18 Hold Time: static X 12' Time: 12'  Physical Therapy Assessment and Plan PT Assessment and Plan Clinical Impression Statement: No lasting relief with current modalities; trial of cervical traction to help decrease radicular  symptoms into R cervical area.  Able to resolve spasm in R upper trap, where pt. c/o most pain.  Noted improvement in R rotation ROM following STM.  Pt reported 2 level pain reduction to 6/10 at end of session.  PT Plan: continue to decrease pain and improve overall posture. Continue to address coordinated movements; assess pain relief from traction next visit.     Problem List Patient Active Problem List  Diagnosis  . MORBID OBESITY  . HYPERTENSION  . Atrial fibrillation  . ESOPHAGEAL STRICTURE  . DIZZINESS  . DYSPNEA  . HYPERLIPIDEMIA  . Long term current use of anticoagulant  . Hx of adenomatous colonic polyps  . Rectal bleeding  . GERD (gastroesophageal reflux disease)  . Cervical spondylosis without myelopathy  . Neck pain    PT - End of Session Activity Tolerance: Patient tolerated treatment well General Behavior During Session: [facility] for tasks performed Cognition: [facility] for tasks performed   Lurena Nida, PTA/CLT 10/02/2012, 3:08 PM

## 2012-10-04 ENCOUNTER — Ambulatory Visit (HOSPITAL_COMMUNITY)
Admission: RE | Admit: 2012-10-04 | Discharge: 2012-10-04 | Disposition: A | Discharge status: Home / Self Care | Payer: Medicare Other | Source: Ambulatory Visit | Attending: Orthopedic Surgery | Admitting: Orthopedic Surgery

## 2012-10-04 NOTE — Progress Notes (Signed)
Physical Therapy Treatment Patient Details  Name: Brent Mason MRN: [id number] Date of Birth: 11-30-1945  Today's Date: 10/04/2012 Time: 4098-1191 PT Time Calculation (min): 39 min Charges: 30' Manual, 9' TE Visit#: 7  of 12   Re-eval: 10/16/12    Authorization: Medicare  Authorization Time Period:    Authorization Visit#: 7  of 10    Subjective: Symptoms/Limitations Symptoms: Pt reports the he remains about 8/10 to neck and is having difficulty turning his neck.  Did not find relief after cervical traction.  Pain Assessment Currently in Pain?: Yes Pain Score:   8 Pain Location: Neck Pain Orientation: Right  Precautions/Restrictions     Exercise/Treatments Seated Exercises Shoulder Rolls: Backwards;20 reps;Limitations Shoulder Rolls Limitations: min TC.  Supine Exercises Cervical Isometrics: Flexion;Extension;Right lateral flexion;Left lateral flexion;Right rotation;Left rotation;5 secs;5 reps Neck Retraction: 5 reps;5 secs [employer] Flexion: 5 reps Shoulder Flexion: Both;10 reps;Weights Shoulder Flexion Weights (lbs): 1 lb dowel rod Shoulder ABduction: Both;10 reps Other Supine Exercise: Shoulder ER x10    Physical Therapy Assessment and Plan PT Assessment and Plan Clinical Impression Statement: Focused on manual techniques to decrease pain and improve ROM.  Pt able to demonstrate greater right cervical rotation after treatment today.  Pt continues to have greatest limtations with cervical and scapular coordinated movements.  Requires decreased cueing for posture at end of treatment.  PT Plan: Continue to improve scapular and cervical strength and improve R cervical rotation.  Address pain releif.  D/C traction as no benefit from visit.     Goals Home Exercise Program Pt will Perform Home Exercise Program: Independently PT Short Term Goals Time to Complete Short Term Goals: 2 weeks PT Short Term Goal 1: Pt will report pain less than 5/10 with R cervical  rotation.  PT Short Term Goal 1 - Progress: Progressing toward goal PT Short Term Goal 2: Pt will improve R cervical rotation and SB to the same as L rotation and SB PT Short Term Goal 2 - Progress: Progressing toward goal PT Short Term Goal 3: Pt will improve cervical strength by 1 muscle grade.  PT Short Term Goal 3 - Progress: Progressing toward goal PT Short Term Goal 4: Pt will demonstrate shoulder AROM WNL.  PT Short Term Goal 4 - Progress: Progressing toward goal PT Short Term Goal 5: Pt will present with minimal R cervical spasms.  PT Short Term Goal 5 - Progress: Progressing toward goal PT Long Term Goals Time to Complete Long Term Goals: 4 weeks PT Long Term Goal 1: Pt will report pain less than 4/10 with cervical and shoulder motions to improve QOL.  PT Long Term Goal 1 - Progress: Progressing toward goal PT Long Term Goal 2: Pt will improve NDI to less than 40% for improved QOL>  PT Long Term Goal 2 - Progress: Progressing toward goal Long Term Goal 3: Pt will improve cervical rotation to WNL in order to turn head in the car while driving.  Long Term Goal 3 Progress: Progressing toward goal  Problem List Patient Active Problem List  Diagnosis  . MORBID OBESITY  . HYPERTENSION  . Atrial fibrillation  . ESOPHAGEAL STRICTURE  . DIZZINESS  . DYSPNEA  . HYPERLIPIDEMIA  . Long term current use of anticoagulant  . Hx of adenomatous colonic polyps  . Rectal bleeding  . GERD (gastroesophageal reflux disease)  . Cervical spondylosis without myelopathy  . Neck pain    PT - End of Session Activity Tolerance: Patient tolerated treatment well General Behavior During  Session: [facility] for tasks performed Cognition: [facility] for tasks performed  Saed Hudlow, PT 10/04/2012, 2:40 PM

## 2012-10-07 ENCOUNTER — Ambulatory Visit (HOSPITAL_COMMUNITY)
Admission: RE | Admit: 2012-10-07 | Discharge: 2012-10-07 | Disposition: A | Discharge status: Home / Self Care | Payer: Medicare Other | Source: Ambulatory Visit | Attending: Orthopedic Surgery | Admitting: Orthopedic Surgery

## 2012-10-07 NOTE — Progress Notes (Signed)
Physical Therapy Treatment Patient Details  Name: Brent Mason MRN: [id number] Date of Birth: [birth year]  Today's Date: 10/07/2012 Time: 7829-5621 PT Time Calculation (min): 47 min Charges: 8' [place], 30' Manual, 8' TE Visit#: 8  of 12   Re-eval: 10/16/12    Authorization: Medicare  Authorization Time Period:    Authorization Visit#: 8  of 10    Subjective: Symptoms/Limitations Symptoms: Pt reports he took a pain pill at noon today.  Reports during treatment he does a lot of "pulling up" on his wife to help with her mobility and has to help her with most activities, even though she is capable of performing on her own.  Pain Assessment Currently in Pain?: Yes Pain Score:   6 Pain Location: Neck Pain Orientation: Right Pain Type: Acute pain  Precautions/Restrictions     Exercise/Treatments Seated Exercises Shoulder Rolls: Backwards;20 reps;Limitations Shoulder Rolls Limitations: min TC.  Supine Exercises Cervical Isometrics: Flexion;Extension;Right lateral flexion;Left lateral flexion;Right rotation;Left rotation;5 secs;5 reps Neck Retraction: 5 reps;5 secs [employer] Flexion: 5 reps X to V: 5 reps Shoulder Flexion: Both;15 reps (w/chin tuck) Shoulder Flexion Weights (lbs): 2lb Other Supine Exercise: Shoulder Elevation w/manual resistance x10; Shoulder ER x15  Modalities Modalities: Ultrasound Manual Therapy Manual Therapy: Joint mobilization Joint Mobilization: Grade I-III to C3-T2 SP and R TP AP and PA direction to decrease pain and improve mobility w/STM after  Myofascial Release: supine to mid and anterior scalene to decrease fascial restrictions  Other Manual Therapy: PROM in all directions to R shoulder.  Ultrasound Ultrasound Location: R UT/scalene region in seated position Ultrasound Parameters: 8 min, continuous 1.2 w/cm2 medium head.  W/3x30 sec passive R cervical rotation to follow Ultrasound Goals: Pain  Physical Therapy Assessment and Plan PT  Assessment and Plan Clinical Impression Statement: Pt comes into treatment with significant improvement and holds his head in midline position.  Able to passivly take him 50% of normal R cervical rotation at end of treatment with significant pain.  Discussed importance of allowing his wife to do her own activities and decrease using his R arm to pull her up (likely causing increased pain).  Continues to have significant audible cavitaions with self R rotation about 50% of the time.  PT Plan: f/u on [place] treatment.  Continue to improve R shoulder motion, scapular coordination and decrease fascial restrictions to R side.     Goals    Problem List Patient Active Problem List  Diagnosis  . MORBID OBESITY  . HYPERTENSION  . Atrial fibrillation  . ESOPHAGEAL STRICTURE  . DIZZINESS  . DYSPNEA  . HYPERLIPIDEMIA  . Long term current use of anticoagulant  . Hx of adenomatous colonic polyps  . Rectal bleeding  . GERD (gastroesophageal reflux disease)  . Cervical spondylosis without myelopathy  . Neck pain   PT - End of Session Activity Tolerance: Patient tolerated treatment well General Behavior During Session: Brent Mason for tasks performed Cognition: Brent Mason for tasks performed PT Plan of Care PT Patient Instructions: discussed importance to decrease care giver support to promote independence from his wife in order to decrease his neck pain.   Brent Mason, PT 10/07/2012, 2:56 PM

## 2012-10-11 ENCOUNTER — Ambulatory Visit (HOSPITAL_COMMUNITY)
Admission: RE | Admit: 2012-10-11 | Discharge: 2012-10-11 | Disposition: A | Discharge status: Home / Self Care | Payer: Medicare Other | Source: Ambulatory Visit | Attending: Orthopedic Surgery | Admitting: Orthopedic Surgery

## 2012-10-11 NOTE — Progress Notes (Signed)
Physical Therapy Treatment Patient Details  Name: Brent Mason MRN: [id number] Date of Birth: May 28, 1946  Today's Date: 10/11/2012 Time: 3086-5784 PT Time Calculation (min): 45 min Charges: 35' Manual, 10' TE Visit#: 9  of 12   Re-eval: 10/16/12    Authorization: Medicare  Authorization Time Period:    Authorization Visit#: 9  of 10    Subjective: Symptoms/Limitations Symptoms: Pt reports that he took pain medication 2x today. He is doing his best to not use his RUE to help with wife up.   Precautions/Restrictions     Exercise/Treatments Standing Exercises Upper Extremity Flexion with Stabilization: Flexion UE Flexion with Stabilization Limitations: B UE w/TC for appropriate posture and cervical alingment (neutral spine) Other Standing Exercises: Elev/Dep/Pro/Ret x10 w/TC and VC for proper movement x5 Other Standing Exercises: elbow press into corner 10x 10" Seated Exercises Cervical Isometrics: Extension;5 secs;10 reps Neck Retraction: 10 reps Shoulder Rolls: Backwards;20 reps;Limitations Shoulder Rolls Limitations: mod TC.  Shoulder Flexion: Right;10 reps;Limitations Shoulder Flexion Limitations: w/blue ball Shoulder ABduction: Right;10 reps;Limitations Shoulder Abduction Limitations: w/blue ball Other Seated Exercise: R shld adduction x15  Modalities Modalities: Ultrasound Manual Therapy Manual Therapy: Joint mobilization Joint Mobilization: Seated: Grade I-IV to C3-T2 SP and R TP AP and PA direction to decrease pain and improve mobility w/STM af Other Manual Therapy: Seated: PROM in all directions to R shoulder Ultrasound Ultrasound Goals: Pain  Physical Therapy Assessment and Plan PT Assessment and Plan Clinical Impression Statement: Pt continues to have significant coordination difficulties with shoulder and cervical movements.  Pt has signficiant improvement in R rotation after R shoulder adduction movement with mobilization to R cervical TP's. Pain  continues to be greatest liminitng factor.  PT Plan: G-code update next visit, provide NDI.  Continue with coordinated shoulder movements and decrease pain in order to reach goals.     Goals    Problem List Patient Active Problem List  Diagnosis  . MORBID OBESITY  . HYPERTENSION  . Atrial fibrillation  . ESOPHAGEAL STRICTURE  . DIZZINESS  . DYSPNEA  . HYPERLIPIDEMIA  . Long term current use of anticoagulant  . Hx of adenomatous colonic polyps  . Rectal bleeding  . GERD (gastroesophageal reflux disease)  . Cervical spondylosis without myelopathy  . Neck pain    PT - End of Session Activity Tolerance: Patient tolerated treatment well General Behavior During Session: [facility] for tasks performed Cognition: [facility] for tasks performed  Orlandis Sanden, PT 10/11/2012, 2:35 PM

## 2012-10-14 ENCOUNTER — Ambulatory Visit (HOSPITAL_COMMUNITY)
Admission: RE | Admit: 2012-10-14 | Discharge: 2012-10-14 | Disposition: A | Discharge status: Home / Self Care | Payer: Medicare Other | Source: Ambulatory Visit | Attending: Orthopedic Surgery | Admitting: Orthopedic Surgery

## 2012-10-14 NOTE — Evaluation (Addendum)
Physical Therapy Re-Evaluation/Discharge  Patient Details  Name: Brent Mason MRN: [id number] Date of Birth: 10-02-1946  Today's Date: 10/14/2012 Time: 4098-1191 PT Time Calculation (min): 47 min Charges: 1 MMT, 1 ROM, 1 e-stim, 8' [place], 10' Manual, 10' self Care Visit#: 10  of 20   Re-eval: 11/13/12 Assessment Diagnosis: Cervical spondylosis Next MD Visit: Dr. Romeo Apple - 10/31/11  Authorization: Medicare  Authorization Time Period:    Authorization Visit#: 10  of 10    Subjective Symptoms/Limitations Symptoms: "I took a pill at 12:00 today before coming in. I feel like I am moving easier to the R."  Pt reports he is activily working out at [employer] for 1 hour and doing a lot of the exercises at home.  Pain Assessment Currently in Pain?: Yes Pain Score:   7 Pain Location: Neck  Sensation/Coordination/Flexibility/Functional Tests Coordination Gross Motor Movements are Fluid and Coordinated: No Coordination and Movement Description: impaired with less cueing for scapular stabalizers. Functional Tests Functional Tests: NDI: 66% (was 62%)  Assessment Cervical AROM Overall Cervical AROM Comments: taken in seated (was takned in supine) Cervical Extension: decreased 70% (was decreased 90%) Cervical - Right Side Bend: 14 cm - little pain Cervical - Left Side Bend: 14 cm w/o pain (was 15 cm w/pain ) Cervical - Right Rotation: 17.5 cm w/pain (was 19 cm) Cervical - Left Rotation: 14 cm  (was 14 cm) Cervical Strength Cervical Flexion: 3+/5 (was 3/5) Cervical Extension: 4/5 (was 3+/5) Cervical - Right Side Bend: 4/5 (was 3/5) Cervical - Left Side Bend: 4/5 (was 3/5) Cervical - Right Rotation: 4/5 (was 3/5) Cervical - Left Rotation: 4/5 (was 3/5) Palpation Palpation: significant pain and tenderness with spasms to R middle scalene; decreased AP mobility to R C4-C6 TP   Exercise/Treatments  Modalities: Ultrasound;Psychologist, prison and probation services Location: IFES to R UT and scalene region w/ cervical and R shoulder PROM x10 minutes with e-stim Electrical Stimulation Parameters: IFES hi lo sweep 13V x15 minutes  Ultrasound Ultrasound Location: R UT/scalene region in seated position  Ultrasound Parameters: 8 min, continuous 1.2 w/cm2 medium head. W/3x30 sec passive R cervical rotation to follow  Ultrasound Goals: Pain Discussed with pt in detail about continuing with HEP at the [facility] and f/u w/MD about future treatments.  Encouraged to continue with gentle ROM.   Physical Therapy Assessment and Plan PT Assessment and Plan Clinical Impression Statement: Re-eval and G-code updated.  Brent Mason has attended 10 OP PT visits to address his cervical spondylosis with significant pain to his R side with the following findings: continues to have significant pain to R anterior scalene and SCM with pain with R rotation and SB.  While is showing improvement in strength, continues to have limitations with his overall coordination (however requires mod multifmodal cueing compared to max cueing at beginning of treatment) to scapular stabalizers. At this point pt discussed about finaces and wishes to D/C from PT and w/f/u w/MD in January and will continue working at the [facility] w/exercises.  PT Frequency: Min 3X/week PT Duration: 8 weeks PT Plan: D/C    Goals Home Exercise Program Pt will Perform Home Exercise Program: Independently PT Goal: Perform Home Exercise Program - Progress: Met PT Short Term Goals Time to Complete Short Term Goals: 2 weeks PT Short Term Goal 1: Pt will report pain less than 5/10 with R cervical rotation.  PT Short Term Goal 1 - Progress: Progressing toward goal PT Short Term Goal 2: Pt will improve R  cervical rotation and SB to the same as L rotation and SB PT Short Term Goal 2 - Progress: Progressing toward goal PT Short Term Goal 3: Pt will improve cervical strength by 1 muscle grade.  PT Short Term Goal 3 -  Progress: Met PT Short Term Goal 4: Pt will demonstrate shoulder AROM WNL.  PT Short Term Goal 4 - Progress: Progressing toward goal PT Short Term Goal 5: Pt will present with minimal R cervical spasms.  PT Short Term Goal 5 - Progress: Progressing toward goal PT Long Term Goals Time to Complete Long Term Goals: 12 weeks PT Long Term Goal 1: Pt will report pain less than 4/10 with cervical and shoulder motions to improve QOL.  PT Long Term Goal 1 - Progress: Not met PT Long Term Goal 2: Pt will improve NDI to less than 40% for improved QOL>  PT Long Term Goal 2 - Progress: Not met (66%) Long Term Goal 3: Pt will improve cervical rotation to WNL in order to turn head in the car while driving.  Long Term Goal 3 Progress: Progressing toward goal  Problem List Patient Active Problem List  Diagnosis  . MORBID OBESITY  . HYPERTENSION  . Atrial fibrillation  . ESOPHAGEAL STRICTURE  . DIZZINESS  . DYSPNEA  . HYPERLIPIDEMIA  . Long term current use of anticoagulant  . Hx of adenomatous colonic polyps  . Rectal bleeding  . GERD (gastroesophageal reflux disease)  . Cervical spondylosis without myelopathy  . Neck pain    PT - End of Session Activity Tolerance: Patient tolerated treatment well General Behavior During Session: [facility] for tasks performed Cognition: [facility] for tasks performed  GP Functional Assessment Tool Used: NDI: 66% and self report Functional Limitation: Self care Self Care Goal Status ([id number]): At least 20 percent but less than 40 percent impaired, limited or restricted Self Care Discharge Status [phone]): At least 60 percent but less than 80 percent impaired, limited or restricted  Brent Mason 10/14/2012, 4:48 PM  Physician Documentation Your signature is required to indicate approval of the treatment plan as stated above.  Please sign and either send electronically or make a copy of this report for your files and return this physician signed original.   Please mark  one 1.__approve of plan  2. ___approve of plan with the following conditions.   ______________________________                                                          _____________________ Physician Signature                                                                                                             Date

## 2012-10-17 ENCOUNTER — Ambulatory Visit (HOSPITAL_COMMUNITY): Payer: Medicare Other | Admitting: Physical Therapy

## 2012-10-18 ENCOUNTER — Ambulatory Visit (HOSPITAL_COMMUNITY): Payer: Medicare Other | Admitting: Physical Therapy

## 2012-10-22 ENCOUNTER — Ambulatory Visit: Payer: Medicare Other | Admitting: Orthopedic Surgery

## 2012-10-23 ENCOUNTER — Ambulatory Visit (INDEPENDENT_AMBULATORY_CARE_PROVIDER_SITE_OTHER): Payer: Medicare Other | Admitting: *Deleted

## 2012-10-23 DIAGNOSIS — Z7901 Long term (current) use of anticoagulants: Secondary | ICD-10-CM

## 2012-10-23 DIAGNOSIS — I4891 Unspecified atrial fibrillation: Secondary | ICD-10-CM

## 2012-10-23 LAB — POCT INR: INR: 3.5

## 2012-10-30 ENCOUNTER — Ambulatory Visit (INDEPENDENT_AMBULATORY_CARE_PROVIDER_SITE_OTHER): Payer: Medicare Other | Admitting: Orthopedic Surgery

## 2012-10-30 ENCOUNTER — Encounter: Payer: Self-pay | Admitting: Orthopedic Surgery

## 2012-10-30 VITALS — BP 110/78 | Ht 74.0 in | Wt 290.0 lb

## 2012-10-30 DIAGNOSIS — M47812 Spondylosis without myelopathy or radiculopathy, cervical region: Secondary | ICD-10-CM

## 2012-10-30 DIAGNOSIS — M4802 Spinal stenosis, cervical region: Secondary | ICD-10-CM

## 2012-10-30 NOTE — Patient Instructions (Addendum)
You have been scheduled for an MRI scan.  Your insurance company requires a precertification prior to scheduling the MRI.  If the MRI scan is not approved we will let you know and make further treatment recommendations according to your insurance's guidelines.   We will call you with the results and then schedule you for epidurals

## 2012-10-30 NOTE — Progress Notes (Signed)
Patient ID: Brent Mason, male   DOB: 1946/09/18, 67 y.o.   MRN: [id number] Chief Complaint  Patient presents with  . Follow-up    cervical spondylosis    The patient continues to complain of cervical pain with radiation into his RIGHT arm despite chiropractic treatment physical therapy, which he just completed a six-week course we also tried anti-inflammatory with Naprosyn, as well as steroid Dosepak and also put him on hydrocodone without relief.  We recommended he have an MRI of his cervical spine in preparation for epidural steroid injections.  He is probably not a surgical candidate based on his age, atrial fibrillation, and warfarin therapy.  Diagnosis spinal stenosis with cervical spondylosis and radicular pain. RIGHT upper extremity

## 2012-11-05 ENCOUNTER — Telehealth: Payer: Self-pay | Admitting: Radiology

## 2012-11-05 ENCOUNTER — Other Ambulatory Visit: Payer: Self-pay | Admitting: Radiology

## 2012-11-05 DIAGNOSIS — M792 Neuralgia and neuritis, unspecified: Secondary | ICD-10-CM

## 2012-11-05 NOTE — Telephone Encounter (Signed)
Patient has MRI at [facility] on 11-10-11 at 4:15. Patient has [employer], authorization [phone] and it expires on 12-04-12. He will follow back up here in the office for results.

## 2012-11-09 ENCOUNTER — Ambulatory Visit
Admission: RE | Admit: 2012-11-09 | Discharge: 2012-11-09 | Disposition: A | Discharge status: Home / Self Care | Payer: Medicare Other | Source: Ambulatory Visit | Attending: Orthopedic Surgery | Admitting: Orthopedic Surgery

## 2012-11-09 ENCOUNTER — Other Ambulatory Visit: Payer: Medicare Other

## 2012-11-09 DIAGNOSIS — M792 Neuralgia and neuritis, unspecified: Secondary | ICD-10-CM

## 2012-11-18 ENCOUNTER — Ambulatory Visit (INDEPENDENT_AMBULATORY_CARE_PROVIDER_SITE_OTHER): Payer: Medicare Other | Admitting: Orthopedic Surgery

## 2012-11-18 VITALS — BP 130/88 | Ht 74.0 in | Wt 290.0 lb

## 2012-11-18 DIAGNOSIS — M47812 Spondylosis without myelopathy or radiculopathy, cervical region: Secondary | ICD-10-CM

## 2012-11-18 NOTE — Progress Notes (Signed)
Patient ID: Brent Mason, male   DOB: [birth year], 67 y.o.   MRN: [id number] Chief Complaint  Patient presents with  . Follow-up    MRI results   C4-5: Broad-based disc osteophyte. Mild spinal stenosis. Mild  bilateral foraminal narrowing greater on the right.  C5-6: Broad-based disc osteophyte complex greater to the right.  Mild spinal stenosis greater on the right. Crowding of the ventral  nerve roots greater on the right. Facet joint degenerative changes  and uncinate bony overgrowth. Moderate right-sided and mild left-  sided foraminal narrowing.  C6-7: Broad-based disc osteophyte complex greater to the right.  Mild right-sided spinal stenosis. Uncinate bony overgrowth. Mild  to moderate bilateral foraminal narrowing.  C7-T1: Facet joint degenerative change greater left. Minimal  anterior slip of C7. Minimal bulge. No significant spinal  stenosis or foraminal narrowing.  The MRI findings above are discussed with the patient his daughter and his wife  He is on Coumadin he has atrial fibrillation I think he would be nonoperative treatment  He wishes to see interventional is can inject him I think he should see a neurosurgeon as well as given him some guidelines such as being careful with turning, watching for alterations in gait, weakness, increasing pain  He seems to understand  We will continue medication as needed and we'll see interventional list and then the neurosurgeon to follow

## 2012-11-18 NOTE — Progress Notes (Signed)
Patient ID: Brent Mason, male   DOB: August 18, 1946, 67 y.o.   MRN: [id number]

## 2012-11-18 NOTE — Patient Instructions (Addendum)
Referral to neurosurgery.

## 2012-11-18 NOTE — Progress Notes (Signed)
Patient ID: Brent Mason, male   DOB: [birth year], 67 y.o.   MRN: [id number] Chief Complaint  Patient presents with  . Follow-up    MRI results   BP 130/88  Ht 6\' 2"  (1.88 m)  Wt 290 lb (131.543 kg)  BMI 37.23 kg/m2  Followup MRI results cervical spine for neck pain radicular symptoms, denies gait disturbance still having pain  Current medication Norco  Previous treatment physical therapy  No improvement  Review of MRI report report indicates the following C4-5: Broad-based disc osteophyte. Mild spinal stenosis. Mild  bilateral foraminal narrowing greater on the right.  C5-6: Broad-based disc osteophyte complex greater to the right.  Mild spinal stenosis greater on the right. Crowding of the ventral  nerve roots greater on the right. Facet joint degenerative changes  and uncinate bony overgrowth. Moderate right-sided and mild left-  sided foraminal narrowing.  C6-7: Broad-based disc osteophyte complex greater to the right.  Mild right-sided spinal stenosis. Uncinate bony overgrowth. Mild  to moderate bilateral foraminal narrowing.  C7-T1: Facet joint degenerative change greater left. Minimal  anterior slip of C7. Minimal bulge. No significant spinal  stenosis or foraminal narrowing.  T1-2: Mild facet joint degenerative changes. Minimal anterior  slip T1. Minimal bulge. No significant spinal stenosis or  foraminal narrowing.  This was discussed with the patient and we've decided to proceed with epidural injections and neurosurgical referral.

## 2012-11-19 ENCOUNTER — Telehealth: Payer: Self-pay | Admitting: *Deleted

## 2012-11-19 ENCOUNTER — Other Ambulatory Visit: Payer: Self-pay | Admitting: *Deleted

## 2012-11-19 DIAGNOSIS — M542 Cervicalgia: Secondary | ICD-10-CM

## 2012-11-19 NOTE — Telephone Encounter (Signed)
Faxed referral and office notes to [facility]. Awaiting appointment. 

## 2012-11-20 ENCOUNTER — Ambulatory Visit (INDEPENDENT_AMBULATORY_CARE_PROVIDER_SITE_OTHER): Payer: Medicare Other | Admitting: *Deleted

## 2012-11-20 DIAGNOSIS — Z7901 Long term (current) use of anticoagulants: Secondary | ICD-10-CM

## 2012-11-20 DIAGNOSIS — I4891 Unspecified atrial fibrillation: Secondary | ICD-10-CM

## 2012-11-20 LAB — POCT INR: INR: 3.4

## 2012-12-02 ENCOUNTER — Other Ambulatory Visit: Payer: Self-pay | Admitting: Adult Health

## 2012-12-02 ENCOUNTER — Encounter: Payer: Self-pay | Admitting: Adult Health

## 2012-12-02 ENCOUNTER — Ambulatory Visit (INDEPENDENT_AMBULATORY_CARE_PROVIDER_SITE_OTHER): Payer: Medicare Other | Admitting: Adult Health

## 2012-12-02 VITALS — BP 122/70 | HR 79 | Ht 74.0 in | Wt 282.0 lb

## 2012-12-02 DIAGNOSIS — I739 Peripheral vascular disease, unspecified: Secondary | ICD-10-CM | POA: Insufficient documentation

## 2012-12-02 DIAGNOSIS — I4891 Unspecified atrial fibrillation: Secondary | ICD-10-CM

## 2012-12-02 DIAGNOSIS — M79604 Pain in right leg: Secondary | ICD-10-CM

## 2012-12-02 DIAGNOSIS — E782 Mixed hyperlipidemia: Secondary | ICD-10-CM

## 2012-12-02 DIAGNOSIS — I10 Essential (primary) hypertension: Secondary | ICD-10-CM

## 2012-12-02 DIAGNOSIS — M79609 Pain in unspecified limb: Secondary | ICD-10-CM

## 2012-12-02 DIAGNOSIS — M79605 Pain in left leg: Secondary | ICD-10-CM

## 2012-12-02 DIAGNOSIS — E785 Hyperlipidemia, unspecified: Secondary | ICD-10-CM

## 2012-12-02 DIAGNOSIS — R0989 Other specified symptoms and signs involving the circulatory and respiratory systems: Secondary | ICD-10-CM

## 2012-12-02 NOTE — Progress Notes (Deleted)
Name: Brent Mason    DOB: April 26, 1946  Age: 67 y.o.  MR#: [id number]       PCP:  Brent Mason., MD      Insurance: Payor: [employer] [place] [employer]  Plan: BLUE MEDICARE  Product Type: *No Product type*    CC:   No chief complaint on file.   VS Filed Vitals:   12/02/12 1400  BP: 122/70  Pulse: 79  Height: 6\' 2"  (1.88 m)  Weight: 282 lb (127.914 kg)    Weights Current Weight  12/02/12 282 lb (127.914 kg)  11/18/12 290 lb (131.543 kg)  10/30/12 290 lb (131.543 kg)    Blood Pressure  BP Readings from Last 3 Encounters:  12/02/12 122/70  11/18/12 130/88  10/30/12 110/78     Admit date:  (Not on file) Last encounter with RMR:  Visit date not found   Allergy Review of patient's allergies indicates no known allergies.  Current Outpatient Prescriptions  Medication Sig Dispense Refill  . captopril (CAPOTEN) 25 MG tablet Take 25 mg by mouth 2 (two) times daily. Except one-half tab on Thursdays      . ferrous sulfate 325 (65 FE) MG tablet Take 325 mg by mouth daily with breakfast.        . gabapentin (NEURONTIN) 300 MG capsule Take 300 mg by mouth at bedtime.      . hydrochlorothiazide (HYDRODIURIL) 25 MG tablet Take 12.5 mg by mouth daily.       Marland Kitchen HYDROcodone-acetaminophen (NORCO) 5-325 MG per tablet Take 1 tablet by mouth every 6 (six) hours as needed for pain.  56 tablet  2  . naproxen (NAPROSYN) 500 MG tablet Take 500 mg by mouth as needed. Pain      . omeprazole (PRILOSEC) 20 MG capsule Take 20 mg by mouth 2 (two) times daily.        . Tamsulosin HCl (FLOMAX) 0.4 MG CAPS Take 0.4 mg by mouth daily.      . theophylline (THEODUR) 100 MG 12 hr tablet Take 100 mg by mouth daily.       Marland Kitchen warfarin (COUMADIN) 5 MG tablet Take 2.5-5 mg by mouth as directed. 5mg  daily except 2.5mg  on Thursday      . zolpidem (AMBIEN) 10 MG tablet Take 10 mg by mouth at bedtime.        No current facility-administered medications for this visit.    Discontinued Meds:   There are  no discontinued medications.  Patient Active Problem List  Diagnosis  . MORBID OBESITY  . HYPERTENSION  . Atrial fibrillation  . ESOPHAGEAL STRICTURE  . DIZZINESS  . DYSPNEA  . HYPERLIPIDEMIA  . Long term current use of anticoagulant  . Hx of adenomatous colonic polyps  . Rectal bleeding  . GERD (gastroesophageal reflux disease)  . Cervical spondylosis without myelopathy  . Neck pain    LABS @BMET3 @  @CMPRESULT3 @ @CBC3 @  Lipid Panel     Component Value Date/Time   CHOL 227* 11/28/2010 2014   TRIG 154* 11/28/2010 2014   HDL 35* 11/28/2010 2014   CHOLHDL 6.5 Ratio 11/28/2010 2014   VLDL 31 11/28/2010 2014   LDLCALC 161* 11/28/2010 2014    ABG No results found for this basename: phart, pco2, pco2art, po2, po2art, hco3, tco2, acidbasedef, o2sat     BNP (last 3 results) No results found for this basename: PROBNP,  in the last 8760 hours Cardiac Panel (last 3 results) No results found for this basename: CKTOTAL,  CKMB, TROPONINI, RELINDX,  in the last 72 hours  Iron/TIBC/Ferritin    Component Value Date/Time   IRON 118 05/22/2012 1322   TIBC 366 05/22/2012 1322   FERRITIN 216 05/22/2012 1322     EKG Orders placed in visit on 05/22/12  . EKG 12-LEAD     Prior Assessment and Plan Problem List as of 12/02/2012     ICD-9-CM     Cardiology Problems   HYPERTENSION   Last Assessment & Plan   05/22/2012 Office Visit Edited 05/22/2012  1:32 PM by Jodelle Gross, NP     Blood pressure is currently well controlled. He will continue on hydrochlorothiazide 25 mg daily and captopril. We will see him again in 6 months unless he becomes symptomatic.    Atrial fibrillation   Last Assessment & Plan   05/22/2012 Office Visit Written 05/22/2012  1:30 PM by Jodelle Gross, NP     Brent Mason's heart rate is well-controlled on current medication regimen. He denies any outpatient surgery dizziness that has occurred in the past. He will be stopping his Coumadin 5 days prior to the  colonoscopy which is dated on 06/03/2012. I have spoken with Brent Derby, RN, Coumadin clinic, who has advised that the patient restart his normal dosing the day after the procedure. He will followup with her in one week post procedure on 06/11/2012. This is been explained to the patient who verbalizes understanding. I have ordered a CBC and anemia panel. I have read the notes from Dr.Rhourke's  office requesting this prior to his colonoscopy procedure. Orders have been provided for him with copies to GI.    HYPERLIPIDEMIA     Other   MORBID OBESITY   ESOPHAGEAL STRICTURE   DIZZINESS   DYSPNEA   Long term current use of anticoagulant   Hx of adenomatous colonic polyps   Rectal bleeding   Last Assessment & Plan   05/07/2012 Office Visit Edited 05/07/2012 12:01 PM by Tiffany Kocher, PA     Intermittent rectal bleeding in the setting of constipation and Coumadin therapy. Last colonoscopy in 2009. He has a history of adenomatous polyps on multiple procedures. At this time would offer colonoscopy.  I have discussed the risks, alternatives, benefits with regards to but not limited to the risk of reaction to medication, bleeding, infection, perforation and the patient is agreeable to proceed. Written consent to be obtained.  He will hold his iron for 7 days. He will hold his Coumadin for 4 days. We will touch base with [facility] to verify that it is okay to do this. Patient reports holding his Coumadin for prior procedures and surgeries.   Add MiraLax 17 g at bedtime on days he does not have a bowel movement.  I have requested labs from Dr. Sharyon Medicus office for review. Patient reports being on chronic iron therapy for "years", possibly decades since finding out that he was anemic and required blood transfusion. He does not recall any specific workup. Need to address whether or not he still requires iron therapy. Further recommendations to follow once labs are reviewed.    GERD (gastroesophageal  reflux disease)   Last Assessment & Plan   05/07/2012 Office Visit Written 05/07/2012 11:56 AM by Tiffany Kocher, PA     Doing very well on omeprazole twice a day. Patient denies recurrent esophageal dysphagia. He is not interested in endoscopy at this time.    Cervical spondylosis without myelopathy   Neck pain  Imaging: Mr Cervical Spine Wo Contrast  11/10/2012  *RADIOLOGY REPORT*  Clinical Data: Neck pain.  Right shoulder pain and arm pain for past 4 months.  No known injury.  MRI CERVICAL SPINE WITHOUT CONTRAST  Technique:  Multiplanar and multiecho pulse sequences of the cervical spine, to include the craniocervical junction and cervicothoracic junction, were obtained according to standard protocol without intravenous contrast.  Comparison: 07/26/2012 plain film examination.  No comparison MR.  Findings: Cervical medullary junction and visualized intracranial structures unremarkable.  No focal cervical cord signal abnormality.  Both vertebral arteries are patent.  Visualized paravertebral structures unremarkable.  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  C2-3:  No significant spinal stenosis or foraminal narrowing.  C3-4:  Mild left-sided foraminal narrowing.  C4-5:  Broad-based disc osteophyte.  Mild spinal stenosis.  Mild bilateral foraminal narrowing greater on the right.  C5-6:  Broad-based disc osteophyte complex greater to the right. Mild spinal stenosis greater on the right. Crowding of the ventral nerve roots greater on the right.  Facet joint degenerative changes and uncinate bony overgrowth.  Moderate right-sided and mild left- sided foraminal narrowing.  C6-7: Broad-based disc osteophyte complex greater to the right. Mild right-sided spinal stenosis.  Uncinate bony overgrowth.  Mild to moderate bilateral foraminal narrowing.  C7-T1:  Facet joint degenerative change greater left.  Minimal anterior slip of C7.  Minimal bulge.  No significant spinal stenosis or foraminal narrowing.   T1-2:  Mild facet joint degenerative changes.  Minimal anterior slip T1.  Minimal bulge.  No significant spinal stenosis or foraminal narrowing.  T2-3:  Minimal bulge.  IMPRESSION: Degenerative changes most prominent C5-6 followed by the C6-7 level as discussed above.  Appearance of congenital fusion C2-C4.   Original Report Authenticated By: Lacy Duverney, M.D.

## 2012-12-02 NOTE — Assessment & Plan Note (Signed)
Will plan segmental ABI's for evaluation of PAD.

## 2012-12-02 NOTE — Assessment & Plan Note (Signed)
Fasting lipids and LFTs are ordered with copy to Dr. Sherwood Gambler.

## 2012-12-02 NOTE — Progress Notes (Signed)
HPI: Brent Mason is a 67 y/o patient of Dr.Rothbart we are following for ongoing assessment and treatment of chronic atrial fibrillation with coumadin therapy, followed in the [address] office. He also has a history of hypertension. He has had recent colonoscopy by Dr.Roarke in August of 2013, negative for GI bleed. He has been seen recently by Dr. Romeo Apple for pain in his neck and found to have cervical disc disease, with treatment with use of steroids and PT with moderate but not complete relief. He has also been seen by Dr. Frann Rider for urinary retention and BPH symptoms. He offers no cardiac complaints. He is medically compliant. Dr. Sherwood Gambler follows his labs, but no recent labs have been drawn.  No Known Allergies  Current Outpatient Prescriptions  Medication Sig Dispense Refill  . captopril (CAPOTEN) 25 MG tablet Take 25 mg by mouth 2 (two) times daily. Except one-half tab on Thursdays      . ferrous sulfate 325 (65 FE) MG tablet Take 325 mg by mouth daily with breakfast.        . gabapentin (NEURONTIN) 300 MG capsule Take 300 mg by mouth at bedtime.      . hydrochlorothiazide (HYDRODIURIL) 25 MG tablet Take 12.5 mg by mouth daily.       Marland Kitchen HYDROcodone-acetaminophen (NORCO) 5-325 MG per tablet Take 1 tablet by mouth every 6 (six) hours as needed for pain.  56 tablet  2  . naproxen (NAPROSYN) 500 MG tablet Take 500 mg by mouth as needed. Pain      . omeprazole (PRILOSEC) 20 MG capsule Take 20 mg by mouth 2 (two) times daily.        . Tamsulosin HCl (FLOMAX) 0.4 MG CAPS Take 0.4 mg by mouth daily.      . theophylline (THEODUR) 100 MG 12 hr tablet Take 100 mg by mouth daily.       Marland Kitchen warfarin (COUMADIN) 5 MG tablet Take 2.5-5 mg by mouth as directed. 5mg  daily except 2.5mg  on Thursday      . zolpidem (AMBIEN) 10 MG tablet Take 10 mg by mouth at bedtime.        No current facility-administered medications for this visit.    Past Medical History  Diagnosis Date  . Hypertension   . Esophageal  stricture     several times and food impaction once per patient [name])  . Dizziness   . A-fib   . Anemia     remote, required transfusion. pt has taken chronic iron since    Past Surgical History  Procedure Laterality Date  . Volvulus reduction  [id number]  . Colonoscopy  2009    [facility]-Dr. Karilyn Cota. three polyps removed. Only two were tubular adenomas/adenomas.  . Foot surgery  2011    right  . Colonoscopy  06/03/2012    Procedure: COLONOSCOPY;  Surgeon: Corbin Ade, MD;  Location: [facility];  Service: Endoscopy;  Laterality: N/A;  8:30    ZOX:WRUEAV of systems complete and found to be negative unless listed above  PHYSICAL EXAM BP 122/70  Pulse 79  Ht 6\' 2"  (1.88 m)  Wt 282 lb (127.914 kg)  BMI 36.19 kg/m2  General: Well developed, well nourished, in no acute distress Head: Eyes PERRLA, No xanthomas.   Normal cephalic and atramatic  Lungs: Clear bilaterally to auscultation and percussion. Heart: HRIR S1 S2, without MRG.  Pulses are 2+ & equal.            No carotid bruit. No JVD.  No abdominal bruits. No femoral bruits. Abdomen: Bowel sounds are positive, abdomen soft and non-tender, obese, without masses or                  Hernia's noted. Msk:  Back normal, normal gait. Normal strength and tone for age. Extremities: No clubbing, cyanosis or edema. Diminished to absent pulse in the left DP, thready in popliteal, normal Left DP. Neuro: Alert and oriented X 3. Psych:  Good affect, responds appropriately  EKG: Atrial fibrillation rate of 88 bpm.  ASSESSMENT AND PLAN

## 2012-12-02 NOTE — Patient Instructions (Addendum)
Your physician recommends that you schedule a follow-up appointment in: 6 MONTHS WITH KL  Your physician has requested that you have an ankle brachial index (ABI). During this test an ultrasound and blood pressure cuff are used to evaluate the arteries that supply the arms and legs with blood. Allow thirty minutes for this exam. There are no restrictions or special instructions.  Your physician recommends that you return for lab work in: THIS WEEK (FASTING LIPIDS, LFT'S, BMET) SLIPS GIVEN

## 2012-12-02 NOTE — Assessment & Plan Note (Addendum)
Excellent control of BP at present. He is without evidence of dizziness or weakness. Will have BMET drawn for kidney fx.

## 2012-12-02 NOTE — Assessment & Plan Note (Signed)
Heart rate is well controlled currently. No changes in current medical regimen. He is to continue coumadin clinic evaluation for dosing.

## 2012-12-03 ENCOUNTER — Ambulatory Visit (HOSPITAL_COMMUNITY)
Admission: RE | Admit: 2012-12-03 | Discharge: 2012-12-03 | Disposition: A | Discharge status: Home / Self Care | Payer: Medicare Other | Source: Ambulatory Visit | Attending: Adult Health | Admitting: Adult Health

## 2012-12-03 DIAGNOSIS — M79604 Pain in right leg: Secondary | ICD-10-CM

## 2012-12-03 DIAGNOSIS — M79609 Pain in unspecified limb: Secondary | ICD-10-CM | POA: Insufficient documentation

## 2012-12-03 LAB — BASIC METABOLIC PANEL WITH GFR
BUN: 13 mg/dL (ref 6–23)
CO2: 31 meq/L (ref 19–32)
Calcium: 9.1 mg/dL (ref 8.4–10.5)
Chloride: 101 meq/L (ref 96–112)
Creat: 0.7 mg/dL (ref 0.50–1.35)
Glucose, Bld: 83 mg/dL (ref 70–99)
Potassium: 4.2 meq/L (ref 3.5–5.3)
Sodium: 140 meq/L (ref 135–145)

## 2012-12-03 LAB — HEPATIC FUNCTION PANEL
AST: 20 U/L (ref 0–37)
Albumin: 4 g/dL (ref 3.5–5.2)
Alkaline Phosphatase: 55 U/L (ref 39–117)
Total Protein: 6.4 g/dL (ref 6.0–8.3)

## 2012-12-03 LAB — LIPID PANEL
HDL: 40 mg/dL (ref >39)
LDL Cholesterol: 103 mg/dL — ABNORMAL HIGH (ref 0–99)
Triglycerides: 111 mg/dL (ref <150)

## 2012-12-04 ENCOUNTER — Encounter: Payer: Self-pay | Admitting: *Deleted

## 2012-12-09 ENCOUNTER — Telehealth: Payer: Self-pay | Admitting: *Deleted

## 2012-12-09 NOTE — Telephone Encounter (Signed)
Declined appointment with [facility]

## 2012-12-09 NOTE — Telephone Encounter (Signed)
Patient declined appointment with [facility]. States he will call them back after his "other" appointment in 3 weeks.

## 2012-12-13 ENCOUNTER — Telehealth: Payer: Self-pay | Admitting: Internal Medicine

## 2012-12-13 NOTE — Telephone Encounter (Signed)
Attempted to contact Megan at orthopedic clinic.  According to patient he has already stopped his coumadin as of yesterday and is planning on having injection as scheduled on Monday.  Advised him that we will not be able to give clearance at this late of date. Verbalized understanding.

## 2012-12-13 NOTE — Telephone Encounter (Signed)
I have looked here and have not received anything on this patient.  I have called Tonya in the [place] office so she can give Tomi Check,RN the heads up message is coming her way

## 2012-12-13 NOTE — Telephone Encounter (Signed)
This was sent to Dr Ladona Ridgel in error.  Has been seen by Joni Reining, NP.  Please advise.

## 2012-12-13 NOTE — Telephone Encounter (Signed)
Will need more information about type of "steroid injection" when it is planned so that we can make recommendations for his coumadin and bridging. He will need to come in to the office to see Vashti Hey for coumadin check and recommendations.

## 2012-12-13 NOTE — Telephone Encounter (Signed)
Pt needs to come off coumadin starting today for steroid injection, sent request to med rec fax three times and pt's appt is Monday, is it enough time for him to go off? Is so,needs to stop with this evenings dose,  fax [phone] asap

## 2012-12-23 ENCOUNTER — Ambulatory Visit (INDEPENDENT_AMBULATORY_CARE_PROVIDER_SITE_OTHER): Payer: Medicare Other | Admitting: *Deleted

## 2012-12-23 DIAGNOSIS — Z7901 Long term (current) use of anticoagulants: Secondary | ICD-10-CM

## 2012-12-23 DIAGNOSIS — I4891 Unspecified atrial fibrillation: Secondary | ICD-10-CM

## 2012-12-23 LAB — POCT INR: INR: 2.1

## 2013-01-20 ENCOUNTER — Telehealth: Payer: Self-pay | Admitting: *Deleted

## 2013-01-20 NOTE — Telephone Encounter (Signed)
Patient's daughter, Albin Felling , called and said patient wants to go [facility], but they don't have the referral records still on file. I faxed referral and office notes again.

## 2013-01-22 NOTE — Telephone Encounter (Signed)
Appointment with Dr. Jordan Likes at [facility] 01/28/13 at 3:30 pm. Patient aware.

## 2013-01-23 ENCOUNTER — Ambulatory Visit (INDEPENDENT_AMBULATORY_CARE_PROVIDER_SITE_OTHER): Payer: Medicare Other | Admitting: *Deleted

## 2013-01-23 DIAGNOSIS — Z7901 Long term (current) use of anticoagulants: Secondary | ICD-10-CM

## 2013-01-23 DIAGNOSIS — I4891 Unspecified atrial fibrillation: Secondary | ICD-10-CM

## 2013-01-23 LAB — POCT INR: INR: 2.7

## 2013-01-28 ENCOUNTER — Telehealth: Payer: Self-pay | Admitting: Internal Medicine

## 2013-01-28 NOTE — Telephone Encounter (Signed)
Spoke with [employer].  She states Dr Lelon Perla in [place] is planning to cervical disc surgery (anterior approach) on pt.  He will fax note to Dr Ladona Ridgel for approval to come off coumadin.  Date not scheduled yet.

## 2013-01-28 NOTE — Telephone Encounter (Signed)
States that patient is needing to have surgery and needs okay for patient to come off of Coumadin prior to surgery. / tgs

## 2013-01-29 ENCOUNTER — Telehealth: Payer: Self-pay | Admitting: *Deleted

## 2013-01-29 NOTE — Telephone Encounter (Signed)
Received correspondence from Dr Julio Sicks regarding cardiac clearance for C/5-6,6-7 Anterior cervical fusion.  Was last seen in February.  Please advise.

## 2013-01-29 NOTE — Telephone Encounter (Signed)
Ok to proceed with surgery. Coumadin will possibly need to be held for surgery,  and can be safely done so for 5 days, with restart ASAP thereafter. Need coumadin clinic appt soon after restarting for INR check.

## 2013-01-29 NOTE — Telephone Encounter (Signed)
Copy of recommendations to Dr pool

## 2013-02-10 ENCOUNTER — Other Ambulatory Visit: Payer: Self-pay | Admitting: Neurosurgery

## 2013-02-10 NOTE — Pre-Procedure Instructions (Signed)
MONTEL VANDERHOOF  02/10/2013   Your procedure is scheduled on:  Friday, May 2nd  Report to [facility] at 5:30.AM.  Call this number if you have problems the morning of surgery: [phone]   Remember:   Do not eat food or drink liquids after midnight.    Take these medicines the morning of surgery with A SIP OF WATER:  omeprazole (PRILOSEC)  Tamsulosin HCl (FLOMAX theophylline (THEODUR   May take if needed: HYDROcodone-acetaminophen (NORCO)     Do not wear jewelry, make-up or nail polish.  Do not wear lotions, powders, or perfumes. You may wear deodorant.   Men may shave face and neck.  Do not bring valuables to the hospital.  Contacts, dentures or bridgework may not be worn into surgery.  Leave suitcase in the car. After surgery it may be brought to your room.  For patients admitted to the hospital, checkout time is 11:00 AM the day of discharge.   Patients discharged the day of surgery will not be allowed to drive home.  Name and phone number of your driver: -   Special Instructions: .Shower using CHG 2 nights before surgery and the night before surgery.  If you shower the day of surgery use CHG.  Use special wash- you have one bottle of CHG for all showers.  You  Should use approximately 1/3 of the bottle for each shower.   Please read over the following fact sheets that you were given: Pain Booklet, Coughing and Deep Breathing and Surgical Site Infection Prevention

## 2013-02-10 NOTE — Progress Notes (Addendum)
Anesthesia Chart Review:  Patient is a 67 year old male scheduled for 2 level ACDF by Dr. Jordan Likes on 02/14/13.  History includes obesity, HTN, chronic afib with history of prior cardioversion, esophageal stricture, HLD, GERD, non-smoker, claudication.  PCP is Dr. Elfredia Nevins.   Cardiologist is Dr. Dietrich Pates at [facility] in [place]. (He has also seen Dr. Ladona Ridgel with [facility] in the past.) According cardiology Joni Reining, NP notation from 01/29/13, "Ok to proceed with surgery. Coumadin will possibly need to be held for surgery, and can be safely done so for 5 days, with restart ASAP thereafter. Need coumadin clinic appt soon after restarting for INR check."  EKG on 12/02/12 showed afib @ 79 bpm.  Echo on 10/21/10 showed: - Left ventricle: The cavity size was normal. There was mild concentric hypertrophy. Systolic function was normal. The estimated ejection fraction was in the range of 55% to 60%. Wall motion was normal; there were no regional wall motion abnormalities. - Aortic valve: Mild regurgitation. - Mitral valve: Calcified annulus. Trivial regurgitation. - Left atrium: The atrium was moderately dilated. - Right atrium: The atrium was mildly to moderately dilated. - Tricuspid valve: Trivial regurgitation. - Atrial septum: No defect or patent foramen ovale was identified.  CXR on 02/11/13 showed no acute cardiopulmonary disease.  Preoperative labs noted.  PT/INR 15.7/1.28 and PTT 32.  His Coumadin is already on hold.    Patient has known chronic afib.  HR was 73 at PAT today.  He has cardiac clearance for this procedure.  INR is WNL.  If no acute changes then would anticipate that he could proceed as planned.  [facility]esthesiology Phone [phone] 02/11/2013 12:30 PM

## 2013-02-11 ENCOUNTER — Encounter (HOSPITAL_COMMUNITY)
Admission: RE | Admit: 2013-02-11 | Discharge: 2013-02-11 | Disposition: A | Discharge status: Home / Self Care | Payer: Medicare Other | Source: Ambulatory Visit | Attending: Neurosurgery | Admitting: Neurosurgery

## 2013-02-11 ENCOUNTER — Encounter (HOSPITAL_COMMUNITY): Payer: Self-pay

## 2013-02-11 HISTORY — DX: Shortness of breath: R06.02

## 2013-02-11 HISTORY — DX: Sleep apnea, unspecified: G47.30

## 2013-02-11 HISTORY — DX: Chronic kidney disease, unspecified: N18.9

## 2013-02-11 LAB — BASIC METABOLIC PANEL
Chloride: 101 mEq/L (ref 96–112)
GFR calc Af Amer: >90 mL/min (ref >90)
GFR calc non Af Amer: 90 mL/min — ABNORMAL LOW (ref >90)
Potassium: 3.8 mEq/L (ref 3.5–5.1)

## 2013-02-11 LAB — CBC WITH DIFFERENTIAL/PLATELET
Basophils Absolute: 0.1 10*3/uL (ref 0.0–0.1)
Basophils Relative: 1 % (ref 0–1)
HCT: 46.4 % (ref 39.0–52.0)
Hemoglobin: 16.4 g/dL (ref 13.0–17.0)
Lymphocytes Relative: 23 % (ref 12–46)
MCHC: 35.3 g/dL (ref 30.0–36.0)
Monocytes Absolute: 0.8 10*3/uL (ref 0.1–1.0)
Monocytes Relative: 11 % (ref 3–12)
Neutro Abs: 4.1 10*3/uL (ref 1.7–7.7)
Neutrophils Relative %: 58 % (ref 43–77)
RDW: 12.6 % (ref 11.5–15.5)
WBC: 7 10*3/uL (ref 4.0–10.5)

## 2013-02-11 LAB — PROTIME-INR
INR: 1.28 (ref 0.00–1.49)
Prothrombin Time: 15.7 seconds — ABNORMAL HIGH (ref 11.6–15.2)

## 2013-02-11 LAB — APTT: aPTT: 32 seconds (ref 24–37)

## 2013-02-11 NOTE — Progress Notes (Signed)
Dr Ladona Ridgel called for last stress test. Coumadin stopped

## 2013-02-13 MED ORDER — DEXTROSE 5 % IV SOLN
3.0000 g | INTRAVENOUS | Status: AC
  Administered 2013-02-14: 3 g via INTRAVENOUS
  Filled 2013-02-13: qty 3000

## 2013-02-13 MED ORDER — DEXAMETHASONE SODIUM PHOSPHATE 10 MG/ML IJ SOLN
10.0000 mg | INTRAMUSCULAR | Status: AC
  Administered 2013-02-14: 10 mg via INTRAVENOUS
  Filled 2013-02-13: qty 1

## 2013-02-14 ENCOUNTER — Inpatient Hospital Stay (HOSPITAL_COMMUNITY): Payer: Medicare Other | Admitting: Anesthesiology

## 2013-02-14 ENCOUNTER — Encounter (HOSPITAL_COMMUNITY)
Admission: RE | Disposition: A | Discharge status: Home / Self Care | Payer: Self-pay | Source: Ambulatory Visit | Attending: Neurosurgery

## 2013-02-14 ENCOUNTER — Inpatient Hospital Stay (HOSPITAL_COMMUNITY)
Admission: RE | Admit: 2013-02-14 | Discharge: 2013-02-15 | DRG: 473 | Disposition: A | Discharge status: Home / Self Care | Payer: Medicare Other | Source: Ambulatory Visit | Attending: Neurosurgery | Admitting: Neurosurgery

## 2013-02-14 ENCOUNTER — Inpatient Hospital Stay (HOSPITAL_COMMUNITY): Payer: Medicare Other

## 2013-02-14 ENCOUNTER — Encounter (HOSPITAL_COMMUNITY): Payer: Self-pay | Admitting: *Deleted

## 2013-02-14 ENCOUNTER — Encounter (HOSPITAL_COMMUNITY): Payer: Self-pay | Admitting: Vascular Surgery

## 2013-02-14 DIAGNOSIS — Z7901 Long term (current) use of anticoagulants: Secondary | ICD-10-CM

## 2013-02-14 DIAGNOSIS — Z8249 Family history of ischemic heart disease and other diseases of the circulatory system: Secondary | ICD-10-CM

## 2013-02-14 DIAGNOSIS — M47812 Spondylosis without myelopathy or radiculopathy, cervical region: Principal | ICD-10-CM | POA: Diagnosis present

## 2013-02-14 DIAGNOSIS — I10 Essential (primary) hypertension: Secondary | ICD-10-CM | POA: Diagnosis present

## 2013-02-14 DIAGNOSIS — Z833 Family history of diabetes mellitus: Secondary | ICD-10-CM

## 2013-02-14 DIAGNOSIS — Z01812 Encounter for preprocedural laboratory examination: Secondary | ICD-10-CM

## 2013-02-14 DIAGNOSIS — I4891 Unspecified atrial fibrillation: Secondary | ICD-10-CM | POA: Diagnosis present

## 2013-02-14 DIAGNOSIS — Z79899 Other long term (current) drug therapy: Secondary | ICD-10-CM

## 2013-02-14 HISTORY — PX: ANTERIOR CERVICAL DECOMP/DISCECTOMY FUSION: SHX1161

## 2013-02-14 SURGERY — ANTERIOR CERVICAL DECOMPRESSION/DISCECTOMY FUSION 2 LEVELS
Anesthesia: General | Site: Neck | Wound class: Clean

## 2013-02-14 MED ORDER — BACITRACIN 50000 UNITS IM SOLR
INTRAMUSCULAR | Status: AC
  Filled 2013-02-14: qty 1

## 2013-02-14 MED ORDER — SODIUM CHLORIDE 0.9 % IV SOLN
250.0000 mL | INTRAVENOUS | Status: DC
  Administered 2013-02-14: 250 mL via INTRAVENOUS

## 2013-02-14 MED ORDER — ONDANSETRON HCL 4 MG/2ML IJ SOLN: 4.0000 mg | Freq: Once | INTRAMUSCULAR | Status: DC | PRN

## 2013-02-14 MED ORDER — NEOSTIGMINE METHYLSULFATE 1 MG/ML IJ SOLN
INTRAMUSCULAR | Status: DC | PRN
  Administered 2013-02-14: 5 mg via INTRAVENOUS

## 2013-02-14 MED ORDER — HEMOSTATIC AGENTS (NO CHARGE) OPTIME
TOPICAL | Status: DC | PRN
  Administered 2013-02-14: 1 via TOPICAL

## 2013-02-14 MED ORDER — ROCURONIUM BROMIDE 100 MG/10ML IV SOLN
INTRAVENOUS | Status: DC | PRN
  Administered 2013-02-14: 10 mg via INTRAVENOUS
  Administered 2013-02-14 (×2): 5 mg via INTRAVENOUS
  Administered 2013-02-14: 30 mg via INTRAVENOUS
  Administered 2013-02-14: 50 mg via INTRAVENOUS
  Administered 2013-02-14: 10 mg via INTRAVENOUS

## 2013-02-14 MED ORDER — CYCLOBENZAPRINE HCL 10 MG PO TABS
10.0000 mg | ORAL_TABLET | Freq: Three times a day (TID) | ORAL | Status: DC | PRN
  Administered 2013-02-15: 10 mg via ORAL
  Filled 2013-02-14: qty 1

## 2013-02-14 MED ORDER — THEOPHYLLINE ER 100 MG PO TB12
100.0000 mg | ORAL_TABLET | Freq: Every day | ORAL | Status: DC
Start: 2013-02-14 — End: 2013-02-15
  Administered 2013-02-14: 100 mg via ORAL
  Filled 2013-02-14 (×2): qty 1

## 2013-02-14 MED ORDER — LIDOCAINE HCL (CARDIAC) 20 MG/ML IV SOLN
INTRAVENOUS | Status: DC | PRN
  Administered 2013-02-14: 100 mg via INTRAVENOUS

## 2013-02-14 MED ORDER — MIDAZOLAM HCL 5 MG/5ML IJ SOLN
INTRAMUSCULAR | Status: DC | PRN
  Administered 2013-02-14 (×2): 1 mg via INTRAVENOUS

## 2013-02-14 MED ORDER — ONDANSETRON HCL 4 MG/2ML IJ SOLN
INTRAMUSCULAR | Status: DC | PRN
  Administered 2013-02-14: 4 mg via INTRAVENOUS

## 2013-02-14 MED ORDER — MENTHOL 3 MG MT LOZG: 1.0000 | LOZENGE | OROMUCOSAL | Status: DC | PRN

## 2013-02-14 MED ORDER — LACTATED RINGERS IV SOLN
INTRAVENOUS | Status: DC | PRN
  Administered 2013-02-14 (×2): via INTRAVENOUS

## 2013-02-14 MED ORDER — ACETAMINOPHEN 650 MG RE SUPP: 650.0000 mg | RECTAL | Status: DC | PRN

## 2013-02-14 MED ORDER — ONDANSETRON HCL 4 MG/2ML IJ SOLN: 4.0000 mg | INTRAMUSCULAR | Status: DC | PRN

## 2013-02-14 MED ORDER — CAPTOPRIL 25 MG PO TABS
25.0000 mg | ORAL_TABLET | Freq: Two times a day (BID) | ORAL | Status: DC
  Administered 2013-02-14 (×2): 25 mg via ORAL
  Filled 2013-02-14 (×4): qty 1

## 2013-02-14 MED ORDER — OXYCODONE HCL 5 MG/5ML PO SOLN: 5.0000 mg | Freq: Once | ORAL | Status: DC | PRN

## 2013-02-14 MED ORDER — ACETAMINOPHEN 325 MG PO TABS: 650.0000 mg | ORAL_TABLET | ORAL | Status: DC | PRN

## 2013-02-14 MED ORDER — PHENOL 1.4 % MT LIQD: 1.0000 | OROMUCOSAL | Status: DC | PRN

## 2013-02-14 MED ORDER — RAMIPRIL 2.5 MG PO CAPS
2.5000 mg | ORAL_CAPSULE | Freq: Two times a day (BID) | ORAL | Status: DC
  Administered 2013-02-14 (×2): 2.5 mg via ORAL
  Filled 2013-02-14 (×4): qty 1

## 2013-02-14 MED ORDER — PANTOPRAZOLE SODIUM 40 MG PO TBEC
40.0000 mg | DELAYED_RELEASE_TABLET | Freq: Every day | ORAL | Status: DC
  Administered 2013-02-14: 40 mg via ORAL
  Filled 2013-02-14: qty 1

## 2013-02-14 MED ORDER — FERROUS SULFATE 325 (65 FE) MG PO TABS
325.0000 mg | ORAL_TABLET | Freq: Every day | ORAL | Status: DC
  Filled 2013-02-14 (×2): qty 1

## 2013-02-14 MED ORDER — OXYCODONE HCL 5 MG PO TABS: 5.0000 mg | ORAL_TABLET | Freq: Once | ORAL | Status: DC | PRN

## 2013-02-14 MED ORDER — PROPOFOL 10 MG/ML IV BOLUS
INTRAVENOUS | Status: DC | PRN
  Administered 2013-02-14: 50 mg via INTRAVENOUS
  Administered 2013-02-14: 150 mg via INTRAVENOUS

## 2013-02-14 MED ORDER — DEXTROSE 5 % IV SOLN
INTRAVENOUS | Status: DC | PRN
  Administered 2013-02-14: 08:00:00 via INTRAVENOUS

## 2013-02-14 MED ORDER — SODIUM CHLORIDE 0.9 % IJ SOLN: 3.0000 mL | INTRAMUSCULAR | Status: DC | PRN

## 2013-02-14 MED ORDER — SODIUM CHLORIDE 0.9 % IV SOLN
INTRAVENOUS | Status: AC
  Filled 2013-02-14: qty 500

## 2013-02-14 MED ORDER — THROMBIN 5000 UNITS EX SOLR
CUTANEOUS | Status: DC | PRN
  Administered 2013-02-14 (×2): 5000 [IU] via TOPICAL

## 2013-02-14 MED ORDER — 0.9 % SODIUM CHLORIDE (POUR BTL) OPTIME
TOPICAL | Status: DC | PRN
  Administered 2013-02-14: 1000 mL

## 2013-02-14 MED ORDER — MEPERIDINE HCL 25 MG/ML IJ SOLN: 6.2500 mg | INTRAMUSCULAR | Status: DC | PRN

## 2013-02-14 MED ORDER — CEFAZOLIN SODIUM 1-5 GM-% IV SOLN
1.0000 g | Freq: Three times a day (TID) | INTRAVENOUS | Status: AC
  Administered 2013-02-14 (×2): 1 g via INTRAVENOUS
  Filled 2013-02-14 (×2): qty 50

## 2013-02-14 MED ORDER — SODIUM CHLORIDE 0.9 % IJ SOLN
3.0000 mL | Freq: Two times a day (BID) | INTRAMUSCULAR | Status: DC
  Administered 2013-02-14: 3 mL via INTRAVENOUS

## 2013-02-14 MED ORDER — TAMSULOSIN HCL 0.4 MG PO CAPS
0.4000 mg | ORAL_CAPSULE | Freq: Every day | ORAL | Status: DC
  Administered 2013-02-14: 0.4 mg via ORAL
  Filled 2013-02-14 (×2): qty 1

## 2013-02-14 MED ORDER — HYDROMORPHONE HCL PF 1 MG/ML IJ SOLN
INTRAMUSCULAR | Status: AC
  Filled 2013-02-14: qty 1

## 2013-02-14 MED ORDER — ALUM & MAG HYDROXIDE-SIMETH 200-200-20 MG/5ML PO SUSP: 30.0000 mL | Freq: Four times a day (QID) | ORAL | Status: DC | PRN

## 2013-02-14 MED ORDER — SODIUM CHLORIDE 0.9 % IR SOLN
Status: DC | PRN
  Administered 2013-02-14: 08:00:00

## 2013-02-14 MED ORDER — SENNA 8.6 MG PO TABS
1.0000 | ORAL_TABLET | Freq: Two times a day (BID) | ORAL | Status: DC
  Administered 2013-02-14 (×2): 8.6 mg via ORAL
  Filled 2013-02-14 (×4): qty 1

## 2013-02-14 MED ORDER — ZOLPIDEM TARTRATE 5 MG PO TABS
5.0000 mg | ORAL_TABLET | Freq: Every evening | ORAL | Status: DC | PRN
  Administered 2013-02-14: 5 mg via ORAL
  Filled 2013-02-14: qty 1

## 2013-02-14 MED ORDER — OXYCODONE-ACETAMINOPHEN 5-325 MG PO TABS: 1.0000 | ORAL_TABLET | ORAL | Status: DC | PRN

## 2013-02-14 MED ORDER — FENTANYL CITRATE 0.05 MG/ML IJ SOLN
INTRAMUSCULAR | Status: DC | PRN
  Administered 2013-02-14 (×2): 100 ug via INTRAVENOUS
  Administered 2013-02-14: 50 ug via INTRAVENOUS

## 2013-02-14 MED ORDER — HYDROCODONE-ACETAMINOPHEN 5-325 MG PO TABS
1.0000 | ORAL_TABLET | ORAL | Status: DC | PRN
  Administered 2013-02-14: 1 via ORAL
  Administered 2013-02-15 (×2): 2 via ORAL
  Filled 2013-02-14: qty 2
  Filled 2013-02-14: qty 1
  Filled 2013-02-14: qty 2

## 2013-02-14 MED ORDER — HYDROMORPHONE HCL PF 1 MG/ML IJ SOLN
0.2500 mg | INTRAMUSCULAR | Status: DC | PRN
  Administered 2013-02-14 (×4): 0.5 mg via INTRAVENOUS

## 2013-02-14 MED ORDER — GLYCOPYRROLATE 0.2 MG/ML IJ SOLN
INTRAMUSCULAR | Status: DC | PRN
  Administered 2013-02-14: .8 mg via INTRAVENOUS

## 2013-02-14 MED ORDER — ARTIFICIAL TEARS OP OINT
TOPICAL_OINTMENT | OPHTHALMIC | Status: DC | PRN
  Administered 2013-02-14: 1 via OPHTHALMIC

## 2013-02-14 MED ORDER — HYDROMORPHONE HCL PF 1 MG/ML IJ SOLN
0.5000 mg | INTRAMUSCULAR | Status: DC | PRN
  Administered 2013-02-14: 1 mg via INTRAVENOUS
  Filled 2013-02-14: qty 1

## 2013-02-14 MED ORDER — ZOLPIDEM TARTRATE 10 MG PO TABS: 5.0000 mg | ORAL_TABLET | Freq: Every day | ORAL | Status: DC

## 2013-02-14 SURGICAL SUPPLY — 60 items
ADH SKN CLS LQ APL DERMABOND (GAUZE/BANDAGES/DRESSINGS) ×1
APL SKNCLS STERI-STRIP NONHPOA (GAUZE/BANDAGES/DRESSINGS) ×1
BAG DECANTER FOR FLEXI CONT (MISCELLANEOUS) ×2 IMPLANT
BENZOIN TINCTURE PRP APPL 2/3 (GAUZE/BANDAGES/DRESSINGS) ×2 IMPLANT
BRUSH SCRUB EZ PLAIN DRY (MISCELLANEOUS) ×2 IMPLANT
BUR MATCHSTICK NEURO 3.0 LAGG (BURR) ×2 IMPLANT
CANISTER SUCTION 2500CC (MISCELLANEOUS) ×2 IMPLANT
CLOTH BEACON ORANGE TIMEOUT ST (SAFETY) ×2 IMPLANT
CONT SPEC 4OZ CLIKSEAL STRL BL (MISCELLANEOUS) ×2 IMPLANT
DERMABOND ADHESIVE PROPEN (GAUZE/BANDAGES/DRESSINGS) ×1
DERMABOND ADVANCED .7 DNX6 (GAUZE/BANDAGES/DRESSINGS) IMPLANT
DRAPE C-ARM 42X72 X-RAY (DRAPES) ×4 IMPLANT
DRAPE LAPAROTOMY 100X72 PEDS (DRAPES) ×2 IMPLANT
DRAPE MICROSCOPE LEICA (MISCELLANEOUS) ×1 IMPLANT
DRAPE MICROSCOPE ZEISS OPMI (DRAPES) ×1 IMPLANT
DRAPE POUCH INSTRU U-SHP 10X18 (DRAPES) ×2 IMPLANT
DRILL BIT (BIT) ×1 IMPLANT
DURAPREP 6ML APPLICATOR 50/CS (WOUND CARE) ×2 IMPLANT
ELECT COATED BLADE 2.86 ST (ELECTRODE) ×2 IMPLANT
ELECT REM PT RETURN 9FT ADLT (ELECTROSURGICAL) ×2
ELECTRODE REM PT RTRN 9FT ADLT (ELECTROSURGICAL) ×1 IMPLANT
GAUZE SPONGE 4X4 16PLY XRAY LF (GAUZE/BANDAGES/DRESSINGS) IMPLANT
GLOVE BIO SURGEON STRL SZ8 (GLOVE) ×1 IMPLANT
GLOVE ECLIPSE 8.5 STRL (GLOVE) ×2 IMPLANT
GLOVE EXAM NITRILE LRG STRL (GLOVE) IMPLANT
GLOVE EXAM NITRILE MD LF STRL (GLOVE) ×1 IMPLANT
GLOVE EXAM NITRILE XL STR (GLOVE) IMPLANT
GLOVE EXAM NITRILE XS STR PU (GLOVE) IMPLANT
GLOVE INDICATOR 8.5 STRL (GLOVE) ×1 IMPLANT
GOWN BRE IMP SLV AUR LG STRL (GOWN DISPOSABLE) ×2 IMPLANT
GOWN BRE IMP SLV AUR XL STRL (GOWN DISPOSABLE) ×3 IMPLANT
GOWN STRL REIN 2XL LVL4 (GOWN DISPOSABLE) IMPLANT
HEAD HALTER (SOFTGOODS) ×2 IMPLANT
HEMOSTAT SURGICEL 2X14 (HEMOSTASIS) IMPLANT
KIT BASIN OR (CUSTOM PROCEDURE TRAY) ×2 IMPLANT
KIT ROOM TURNOVER OR (KITS) ×2 IMPLANT
NDL SPNL 20GX3.5 QUINCKE YW (NEEDLE) ×1 IMPLANT
NEEDLE SPNL 20GX3.5 QUINCKE YW (NEEDLE) ×2 IMPLANT
NS IRRIG 1000ML POUR BTL (IV SOLUTION) ×2 IMPLANT
PACK LAMINECTOMY NEURO (CUSTOM PROCEDURE TRAY) ×2 IMPLANT
PAD ARMBOARD 7.5X6 YLW CONV (MISCELLANEOUS) ×6 IMPLANT
PLATE VISION ELITE 40MM (Plate) ×1 IMPLANT
RUBBERBAND STERILE (MISCELLANEOUS) ×4 IMPLANT
SCREW ST 13X4XST VA NS SPNE (Screw) IMPLANT
SCREW ST VAR 4 ATL (Screw) ×12 IMPLANT
SPACER BONE CORNERSTONE 6X14 (Orthopedic Implant) ×1 IMPLANT
SPACER BONE CORNERSTONE 7X14 (Orthopedic Implant) ×1 IMPLANT
SPONGE GAUZE 4X4 12PLY (GAUZE/BANDAGES/DRESSINGS) ×2 IMPLANT
SPONGE INTESTINAL PEANUT (DISPOSABLE) ×2 IMPLANT
SPONGE SURGIFOAM ABS GEL SZ50 (HEMOSTASIS) ×2 IMPLANT
STRIP CLOSURE SKIN 1/2X4 (GAUZE/BANDAGES/DRESSINGS) ×2 IMPLANT
SUT PDS AB 5-0 P3 18 (SUTURE) ×2 IMPLANT
SUT VIC AB 3-0 SH 8-18 (SUTURE) ×2 IMPLANT
SYR 20ML ECCENTRIC (SYRINGE) ×2 IMPLANT
TAPE CLOTH 4X10 WHT NS (GAUZE/BANDAGES/DRESSINGS) ×2 IMPLANT
TAPE CLOTH SURG 4X10 WHT LF (GAUZE/BANDAGES/DRESSINGS) ×1 IMPLANT
TAPE STRIPS DRAPE STRL (GAUZE/BANDAGES/DRESSINGS) ×1 IMPLANT
TOWEL OR 17X24 6PK STRL BLUE (TOWEL DISPOSABLE) ×2 IMPLANT
TOWEL OR 17X26 10 PK STRL BLUE (TOWEL DISPOSABLE) ×2 IMPLANT
WATER STERILE IRR 1000ML POUR (IV SOLUTION) ×2 IMPLANT

## 2013-02-14 NOTE — Transfer of Care (Signed)
Immediate Anesthesia Transfer of Care Note  Patient: Brent Mason  Procedure(s) Performed: Procedure(s): ANTERIOR CERVICAL DECOMPRESSION/DISCECTOMY FUSION 2 LEVELS (N/A)  Patient Location: PACU  Anesthesia Type:General  Level of Consciousness: awake, alert  and patient cooperative  Airway & Oxygen Therapy: Patient Spontanous Breathing and Patient connected to nasal cannula oxygen  Post-op Assessment: Report given to PACU RN, Post -op Vital signs reviewed and stable and Patient moving all extremities  Post vital signs: Reviewed and stable  Complications: No apparent anesthesia complications

## 2013-02-14 NOTE — Brief Op Note (Signed)
02/14/2013  10:08 AM  PATIENT:  Brent Mason  67 y.o. male  PRE-OPERATIVE DIAGNOSIS:  spodylosis/stenosis  POST-OPERATIVE DIAGNOSIS:  spodylosis/stenosis  PROCEDURE:  Procedure(s): ANTERIOR CERVICAL DECOMPRESSION/DISCECTOMY FUSION 2 LEVELS (N/A)  SURGEON:  Surgeon(s) and Role:    * Temple Pacini, MD - Primary    * Mariam Dollar, MD - Assisting  PHYSICIAN ASSISTANT:   ASSISTANTS:    ANESTHESIA:   general  EBL:  Total I/O In: 1050 [I.V.:1050] Out: 100 [Blood:100]  BLOOD ADMINISTERED:none  DRAINS: none   LOCAL MEDICATIONS USED:  NONE  SPECIMEN:  No Specimen  DISPOSITION OF SPECIMEN:  N/A  COUNTS:  YES  TOURNIQUET:  * No tourniquets in log *  DICTATION: .Dragon Dictation  PLAN OF CARE: Admit to inpatient   PATIENT DISPOSITION:  PACU - hemodynamically stable.   Delay start of Pharmacological VTE agent (>24hrs) due to surgical blood loss or risk of bleeding: yes

## 2013-02-14 NOTE — Anesthesia Preprocedure Evaluation (Addendum)
Anesthesia Evaluation  Patient identified by MRN, date of birth, ID band Patient awake    Reviewed: Allergy & Precautions, H&P , NPO status , Patient's Chart, lab work & pertinent test results, reviewed documented beta blocker date and time   Airway Mallampati: II TM Distance: >3 FB Neck ROM: Full    Dental  (+) Teeth Intact and Dental Advisory Given   Pulmonary sleep apnea ,  Pt did not tolerate CPAP         Cardiovascular hypertension, Pt. on medications + dysrhythmias Atrial Fibrillation     Neuro/Psych    GI/Hepatic GERD-  Medicated and Controlled,  Endo/Other  Morbid obesity  Renal/GU      Musculoskeletal   Abdominal   Peds  Hematology   Anesthesia Other Findings small mouth.  No view past tongue.  Reproductive/Obstetrics                         Anesthesia Physical Anesthesia Plan  ASA: III  Anesthesia Plan: General   Post-op Pain Management:    Induction: Intravenous  Airway Management Planned: Oral ETT  Additional Equipment:   Intra-op Plan:   Post-operative Plan: Extubation in OR  Informed Consent: I have reviewed the patients History and Physical, chart, labs and discussed the procedure including the risks, benefits and alternatives for the proposed anesthesia with the patient or authorized representative who has indicated his/her understanding and acceptance.     Plan Discussed with: CRNA and Surgeon  Anesthesia Plan Comments:         Anesthesia Quick Evaluation

## 2013-02-14 NOTE — Anesthesia Postprocedure Evaluation (Signed)
Anesthesia Post Note  Patient: Brent Mason  Procedure(s) Performed: Procedure(s) (LRB): ANTERIOR CERVICAL DECOMPRESSION/DISCECTOMY FUSION 2 LEVELS (N/A)  Anesthesia type: general  Patient location: PACU  Post pain: Pain level controlled  Post assessment: Patient's Cardiovascular Status Stable  Last Vitals:  Filed Vitals:   02/14/13 1310  BP: 137/93  Pulse: 98  Temp:   Resp: 18    Post vital signs: Reviewed and stable  Level of consciousness: sedated  Complications: No apparent anesthesia complications

## 2013-02-14 NOTE — H&P (Signed)
Brent Mason is an 67 y.o. male.   Chief Complaint: Neck pain HPI: 67 year old male with severe neck and right upper extremity pain consistent with a mixed cervical radiculopathy. Patient's failed conservative management her workup demonstrates evidence of marked spondylosis and stenosis at C5-6 and C6-7 worse towards the right with compression exiting C6 and C7 nerve roots. Patient presents now for C-5-6 and C6-7 anterior cervical discectomy and fusion with allograft or plating.  Past Medical History  Diagnosis Date  . Hypertension   . Esophageal stricture     several times and food impaction once per patient [name])  . Dizziness   . A-fib   . Anemia     remote, required transfusion. pt has taken chronic iron since  . Shortness of breath   . Sleep apnea     last sleep study > 5 yrs   no cpap  . Chronic kidney disease     freg uti.        . Atrial fibrillation status post cardioversion   . H/O hiatal hernia   . Arthritis     Past Surgical History  Procedure Laterality Date  . Volvulus reduction  [id number]  . Colonoscopy  2009    [facility]-Dr. Karilyn Cota. three polyps removed. Only two were tubular adenomas/adenomas.  . Foot surgery  2011    right  . Colonoscopy  06/03/2012    Procedure: COLONOSCOPY;  Surgeon: Corbin Ade, MD;  Location: [facility];  Service: Endoscopy;  Laterality: N/A;  8:30  . Hernia repair      Family History  Problem Relation Age of Onset  . Heart disease Mother     Pacemaker  . Diabetes Father   . Colon cancer Neg Hx    Social History:  reports that he has never smoked. He does not have any smokeless tobacco history on file. He reports that he does not drink alcohol or use illicit drugs.  Allergies: No Known Allergies  Medications Prior to Admission  Medication Sig Dispense Refill  . captopril (CAPOTEN) 50 MG tablet Take 25 mg by mouth 2 (two) times daily.      . ferrous sulfate 325 (65 FE) MG tablet Take 325 mg by mouth daily with breakfast.         . HYDROcodone-acetaminophen (NORCO) 5-325 MG per tablet Take 1 tablet by mouth every 6 (six) hours as needed for pain.  56 tablet  2  . naproxen (NAPROSYN) 500 MG tablet Take 500 mg by mouth as needed. Pain      . omeprazole (PRILOSEC) 20 MG capsule Take 20 mg by mouth 2 (two) times daily.        . ramipril (ALTACE) 2.5 MG capsule Take 2.5 mg by mouth 2 (two) times daily.      . Tamsulosin HCl (FLOMAX) 0.4 MG CAPS Take 0.4 mg by mouth daily.      . theophylline (THEODUR) 100 MG 12 hr tablet Take 100 mg by mouth daily.       Marland Kitchen warfarin (COUMADIN) 5 MG tablet Take 2.5-5 mg by mouth as directed. 5mg  daily except 2.5mg  on Thursday      . zolpidem (AMBIEN) 10 MG tablet Take 10 mg by mouth at bedtime.         No results found for this or any previous visit (from the past 48 hour(s)). No results found.  Review of Systems  Constitutional: Negative.   HENT: Negative.   Eyes: Negative.   Respiratory: Negative.  Cardiovascular: Negative.   Gastrointestinal: Negative.   Musculoskeletal: Negative.   Skin: Negative.   Neurological: Negative.   Endo/Heme/Allergies: Negative.   Psychiatric/Behavioral: Negative.     Blood pressure 118/79, pulse 85, temperature 98.3 F (36.8 C), temperature source Oral, resp. rate 18, SpO2 94.00%. Physical Exam  Constitutional: He is oriented to person, place, and time. He appears well-developed and well-nourished. No distress.  HENT:  Head: Normocephalic and atraumatic.  Right Ear: External ear normal.  Left Ear: External ear normal.  Nose: Nose normal.  Mouth/Throat: Oropharynx is clear and moist.  Eyes: Conjunctivae are normal. Pupils are equal, round, and reactive to light. Right eye exhibits no discharge. Left eye exhibits no discharge.  Neck: Normal range of motion. Neck supple. No tracheal deviation present. No thyromegaly present.  Cardiovascular: Normal rate, regular rhythm, normal heart sounds and intact distal pulses.   Respiratory: Effort  normal and breath sounds normal. No respiratory distress. He has no wheezes.  GI: Soft. Bowel sounds are normal. He exhibits no distension. There is no tenderness.  Musculoskeletal: Normal range of motion. He exhibits no edema and no tenderness.  Neurological: He is alert and oriented to person, place, and time. He has normal reflexes. He displays normal reflexes. No cranial nerve deficit. He exhibits normal muscle tone. Coordination normal.  Skin: Skin is warm and dry. No rash noted. He is not diaphoretic. No erythema.     Assessment/Plan C5-6, C6-7 spondylosis with radiculopathy. Plan C5-6 and C6-7 anterior cervical discectomy and fusion with allograft and her plating. Risks and benefits have been explained. Patient wishes to proceed.  Jacia Sickman A 02/14/2013, 7:45 AM

## 2013-02-14 NOTE — Progress Notes (Signed)
Orthopedic Tech Progress Note Patient Details:  Brent Mason [birth year] [id number] Soft collar delivered to Neuro PACU Ortho Devices Type of Ortho Device: Soft collar Ortho Device/Splint Interventions: Ordered   Asia Burnett Kanaris 02/14/2013, 9:51 AM

## 2013-02-14 NOTE — Preoperative (Signed)
Beta Blockers   Reason not to administer Beta Blockers:Not Applicable 

## 2013-02-14 NOTE — Op Note (Signed)
Date of procedure: 02/14/2013  Date of dictation: Same  Service: Neurosurgery  Preoperative diagnosis: C5-6, C6-7 spondylosis with radiculopathy.  Postoperative diagnosis: Same  Procedure Name: C5-6 C6-7 anterior cervical discectomy and fusion with allograft and anterior plating.  Surgeon:Jwan Hornbaker A.Shamona Wirtz, M.D.  Asst. Surgeon: Wynetta Emery  Anesthesia: General  Indication: 67 year old male with severe neck and right upper trimming a pain paresthesias and weakness consistent with a mixed cervical radiculopathy. Workup demonstrates evidence of marked spondylosis kyphosis and stenosis at C5-6 and C6-7 causing severe neuroforaminal stenosis. Patient presents now for two-level anterior cervical decompression infusion in hopes of improving his symptoms.  Operative note: After induction anesthesia, patient positioned supine with Extended and held in place with halter traction. Anterior cervical region prepped and draped. Incision was made overlying the C6 vertebral level. Dissection carried down through the platysma along the medial border of the sternocleidomastoid muscle and carotid sheath. Trachea and esophagus mobilized track towards the left. Prevertebral fascia stripped within her spinal column. Longus colli muscles elevated bilaterally/Carter. Deep self-retaining retractors placed intraoperative x-rays taken levels were confirmed. The spaces at C5-6 and C6-7 and incised 15 blade in her finger fashion. Anterior osteophytes were also removed. A wide disc space cleanout was achieved using pituitary rongeurs for tobacco progress Kerrison rongeurs and a high-speed drill. All meds the disc removed down to level of the posterior annulus. Microscope and brought field used throughout the remainder of the discectomy. Remaining aspects of annulus and osteophytes removed using high-speed drill down to the level posterior longitudinal ligament. Posterior lateral is elevated and resected thecal fashion using Kerrison  rongeurs. Underlying thecal sac was identified. Wide central decompression and perform undercutting the bodies of C5 and C6. Decompression MCH are of foramen. Wide anterior foraminotomies were then performed on course exiting C6 nerve roots. With this point a very thorough decompression had been achieved. There was no evidence of injury to thecal sac or nerve roots. Wound is then irrigated out solution. Procedure then repeated at C6-7 again without complication. 6 mm and 7 mm cornerstone allograft wedges and packed in place at C5-6 and C6-7 respectively. Both grafts were in recessed roughly 1 mm from the anterior cortical margin. 40 mm Atlantis anterior cervical plate was then placed over the C5-6 and 7 levels. This infection or fluoroscopic guidance and 13 mm variable-angle screws 2 each in all 3 levels. All 6 screws given a final tightening be solidly within bone. Locking screws and gauge at all 3 levels. Final images revealed good position bone graft and hardware at proper upper level with normal lamina spine. Wound is then irrigated one final time. Hemostasis was assured. Wounds and closed in a typical fashion. Steri-Strips and sterile dressing were applied. There were no apparent complications. Patient tolerated the procedure well and he returns they're covering postop.

## 2013-02-14 NOTE — Anesthesia Procedure Notes (Signed)
Procedure Name: Intubation Date/Time: 02/14/2013 8:00 AM Performed by: Marni Griffon Pre-anesthesia Checklist: Patient identified, Emergency Drugs available, Suction available and Patient being monitored Patient Re-evaluated:Patient Re-evaluated prior to inductionOxygen Delivery Method: Circle system utilized Preoxygenation: Pre-oxygenation with 100% oxygen Intubation Type: IV induction Ventilation: Mask ventilation without difficulty Laryngoscope Size: Mac and 4 Grade View: Grade II Tube type: Oral Tube size: 8.0 mm Number of attempts: 1 Airway Equipment and Method: Stylet Placement Confirmation: ETT inserted through vocal cords under direct vision,  breath sounds checked- equal and bilateral and positive ETCO2 Secured at: 24 (cm at teeth) cm Tube secured with: Tape Dental Injury: Teeth and Oropharynx as per pre-operative assessment

## 2013-02-15 MED ORDER — OXYCODONE-ACETAMINOPHEN 5-325 MG PO TABS: 1.0000 | ORAL_TABLET | ORAL | Status: DC | PRN

## 2013-02-15 MED ORDER — CYCLOBENZAPRINE HCL 10 MG PO TABS: 10.0000 mg | ORAL_TABLET | Freq: Three times a day (TID) | ORAL | Status: DC | PRN

## 2013-02-15 NOTE — Discharge Summary (Signed)
  Physician Discharge Summary  Patient ID: BRAYLON GRENDA MRN: [id number] DOB/AGE: 68-Apr-1947 68 y.o.  Admit date: 02/14/2013 Discharge date: 02/15/2013  Admission Diagnoses: Cervical spondylosis with stenosis  Discharge Diagnoses: Same Active Problems:   Cervical spondylosis without myelopathy   Discharged Condition: good  Hospital Course: Patient was admitted hospital underwent an ACDF over 2 levels and postop patient did very well recovered in the floor on the floor he was convalescing well and living and voiding spontaneously tolerating regular diet his incision was clean and dry with some bruising and a very minimal amount of swelling patient was stable to be discharged home posterior day 1.  Consults: Significant Diagnostic Studies: Treatments: ACDF Discharge Exam: Blood pressure 136/82, pulse 80, temperature 97.4 F (36.3 C), temperature source Oral, resp. rate 18, SpO2 94.00%. Strength out of 5 wound dry  Disposition: Home   Future Appointments Provider Department Dept Phone   02/20/2013 10:40 AM [facility] [place] [phone]       Medication List    TAKE these medications       captopril 50 MG tablet  Commonly known as:  CAPOTEN  Take 25 mg by mouth 2 (two) times daily.     cyclobenzaprine 10 MG tablet  Commonly known as:  FLEXERIL  Take 1 tablet (10 mg total) by mouth 3 (three) times daily as needed for muscle spasms.     ferrous sulfate 325 (65 FE) MG tablet  Take 325 mg by mouth daily with breakfast.     HYDROcodone-acetaminophen 5-325 MG per tablet  Commonly known as:  NORCO  Take 1 tablet by mouth every 6 (six) hours as needed for pain.     naproxen 500 MG tablet  Commonly known as:  NAPROSYN  Take 500 mg by mouth as needed. Pain     omeprazole 20 MG capsule  Commonly known as:  PRILOSEC  Take 20 mg by mouth 2 (two) times daily.     oxyCODONE-acetaminophen 5-325 MG per tablet  Commonly known as:   PERCOCET/ROXICET  Take 1-2 tablets by mouth every 4 (four) hours as needed.     ramipril 2.5 MG capsule  Commonly known as:  ALTACE  Take 2.5 mg by mouth 2 (two) times daily.     tamsulosin 0.4 MG Caps  Commonly known as:  FLOMAX  Take 0.4 mg by mouth daily.     theophylline 100 MG 12 hr tablet  Commonly known as:  THEODUR  Take 100 mg by mouth daily.     warfarin 5 MG tablet  Commonly known as:  COUMADIN  Take 2.5-5 mg by mouth as directed. 5mg  daily except 2.5mg  on Thursday     zolpidem 10 MG tablet  Commonly known as:  AMBIEN  Take 10 mg by mouth at bedtime.           Follow-up Information   Follow up with Julio Sicks A, MD.   Contact information:   [address] [place] [address] [phone]       Signed: Sherline Eberwein P 02/15/2013, 8:00 AM

## 2013-02-15 NOTE — Progress Notes (Signed)
Patient ID: Brent Mason, male   DOB: [birth year], 67 y.o.   MRN: [id number] Patient doing well arms pain significantly improved having some neck spasm having very minimal swelling difficulty no breathing difficult voiding spontaneously stable enough for discharge

## 2013-02-17 ENCOUNTER — Encounter (HOSPITAL_COMMUNITY): Payer: Self-pay | Admitting: Neurosurgery

## 2013-02-24 ENCOUNTER — Ambulatory Visit (INDEPENDENT_AMBULATORY_CARE_PROVIDER_SITE_OTHER): Payer: Medicare Other | Admitting: *Deleted

## 2013-02-24 DIAGNOSIS — Z7901 Long term (current) use of anticoagulants: Secondary | ICD-10-CM

## 2013-02-24 DIAGNOSIS — I4891 Unspecified atrial fibrillation: Secondary | ICD-10-CM

## 2013-03-17 ENCOUNTER — Ambulatory Visit (INDEPENDENT_AMBULATORY_CARE_PROVIDER_SITE_OTHER): Payer: Medicare Other | Admitting: *Deleted

## 2013-03-17 DIAGNOSIS — Z7901 Long term (current) use of anticoagulants: Secondary | ICD-10-CM

## 2013-03-17 DIAGNOSIS — I4891 Unspecified atrial fibrillation: Secondary | ICD-10-CM

## 2013-04-07 ENCOUNTER — Ambulatory Visit (INDEPENDENT_AMBULATORY_CARE_PROVIDER_SITE_OTHER): Payer: Medicare Other | Admitting: *Deleted

## 2013-04-07 DIAGNOSIS — I4891 Unspecified atrial fibrillation: Secondary | ICD-10-CM

## 2013-04-07 DIAGNOSIS — Z7901 Long term (current) use of anticoagulants: Secondary | ICD-10-CM

## 2013-04-07 LAB — POCT INR: INR: 1.8

## 2013-05-05 ENCOUNTER — Ambulatory Visit (INDEPENDENT_AMBULATORY_CARE_PROVIDER_SITE_OTHER): Payer: Medicare Other | Admitting: *Deleted

## 2013-05-05 DIAGNOSIS — Z7901 Long term (current) use of anticoagulants: Secondary | ICD-10-CM

## 2013-05-05 DIAGNOSIS — I4891 Unspecified atrial fibrillation: Secondary | ICD-10-CM

## 2013-06-02 ENCOUNTER — Ambulatory Visit: Payer: Medicare Other | Admitting: Adult Health

## 2013-06-03 NOTE — Progress Notes (Signed)
HPI: Brent Mason is a 67 year old patient of Dr. Dietrich Pates we are following for ongoing assessment and treatment of chronic atrial fibrillation with Coumadin therapy, hypertension, and mixed hyperlipidemia. He was last seen in the office in February 2014. No changes were made to his medication regimen at that time. Fasting lipids and LFTs were ordered, and bilateral ABIs for evaluation of PAD in the setting of intermittent claudication symptoms.   ABIs completed in February 2014 demonstrated no arterial occlusions with bilateral ABIs greater than 1.0. Total cholesterol was found to be 165 triglycerides 111 HDL 40 LDL 103.   Since last visit he has had cervical spine surgery due to nerve impingement. He has no complaints of racing heart rate or chest pain. He is medically compliant. He is having INR check in the office today.      No Known Allergies  Current Outpatient Prescriptions  Medication Sig Dispense Refill  . captopril (CAPOTEN) 50 MG tablet Take 25 mg by mouth 2 (two) times daily.      . cyclobenzaprine (FLEXERIL) 10 MG tablet Take 1 tablet (10 mg total) by mouth 3 (three) times daily as needed for muscle spasms.  80 tablet  1  . ferrous sulfate 325 (65 FE) MG tablet Take 325 mg by mouth daily with breakfast.        . fluticasone (FLONASE) 50 MCG/ACT nasal spray Place 1 spray into the nose daily.       . hydrochlorothiazide (HYDRODIURIL) 25 MG tablet Take 25 mg by mouth daily.       Marland Kitchen HYDROcodone-acetaminophen (NORCO) 5-325 MG per tablet Take 1 tablet by mouth every 6 (six) hours as needed for pain.  56 tablet  2  . naproxen (NAPROSYN) 500 MG tablet Take 500 mg by mouth as needed. Pain      . omeprazole (PRILOSEC) 20 MG capsule Take 20 mg by mouth 2 (two) times daily.        Marland Kitchen oxyCODONE-acetaminophen (PERCOCET/ROXICET) 5-325 MG per tablet Take 1-2 tablets by mouth every 4 (four) hours as needed.  80 tablet  0  . ramipril (ALTACE) 2.5 MG capsule Take 2.5 mg by mouth 2 (two) times daily.       . Tamsulosin HCl (FLOMAX) 0.4 MG CAPS Take 0.4 mg by mouth daily.      . theophylline (THEODUR) 100 MG 12 hr tablet Take 100 mg by mouth daily.       Marland Kitchen warfarin (COUMADIN) 5 MG tablet Take 2.5-5 mg by mouth as directed. 5mg  daily except 2.5mg  on Thursday      . zolpidem (AMBIEN) 10 MG tablet Take 10 mg by mouth at bedtime.        No current facility-administered medications for this visit.    Past Medical History  Diagnosis Date  . Hypertension   . Esophageal stricture     several times and food impaction once per patient [name])  . Dizziness   . A-fib   . Anemia     remote, required transfusion. pt has taken chronic iron since  . Shortness of breath   . Sleep apnea     last sleep study > 5 yrs   no cpap  . Chronic kidney disease     freg uti.        . Atrial fibrillation status post cardioversion   . H/O hiatal hernia   . Arthritis     Past Surgical History  Procedure Laterality Date  . Volvulus reduction  [id number]  .  Colonoscopy  2009    [facility]-Dr. Karilyn Cota. three polyps removed. Only two were tubular adenomas/adenomas.  . Foot surgery  2011    right  . Colonoscopy  06/03/2012    Procedure: COLONOSCOPY;  Surgeon: Corbin Ade, MD;  Location: [facility];  Service: Endoscopy;  Laterality: N/A;  8:30  . Hernia repair    . Anterior cervical decomp/discectomy fusion N/A 02/14/2013    Procedure: ANTERIOR CERVICAL DECOMPRESSION/DISCECTOMY FUSION 2 LEVELS;  Surgeon: Temple Pacini, MD;  Location: [facility];  Service: Neurosurgery;  Laterality: N/A;    WUJ:WJXBJY of systems complete and found to be negative unless listed above  PHYSICAL EXAM BP 121/84  Pulse 95  Ht 6\' 2"  (1.88 m)  Wt 279 lb 12 oz (126.894 kg)  BMI 35.9 kg/m2  General: Well developed, well nourished, in no acute distress Head: Eyes PERRLA, No xanthomas.   Normal cephalic and atramatic  Lungs: Clear bilaterally to auscultation and percussion. Heart: HRIR S1 S2, without MRG.  Pulses are 2+ &  equal.            No carotid bruit. No JVD.  No abdominal bruits. No femoral bruits. Abdomen: Bowel sounds are positive, abdomen soft and non-tender without masses or                  Hernia's noted. Msk:  Back normal, normal gait. Normal strength and tone for age. Extremities: No clubbing, cyanosis or edema.  DP +1 Neuro: Alert and oriented X 3. Psych:  Good affect, responds appropriately    ASSESSMENT AND PLAN

## 2013-06-04 ENCOUNTER — Ambulatory Visit (INDEPENDENT_AMBULATORY_CARE_PROVIDER_SITE_OTHER): Payer: Medicare Other | Admitting: *Deleted

## 2013-06-04 ENCOUNTER — Encounter: Payer: Self-pay | Admitting: Adult Health

## 2013-06-04 ENCOUNTER — Ambulatory Visit (INDEPENDENT_AMBULATORY_CARE_PROVIDER_SITE_OTHER): Payer: Medicare Other | Admitting: Adult Health

## 2013-06-04 VITALS — BP 121/84 | HR 95 | Ht 74.0 in | Wt 279.8 lb

## 2013-06-04 DIAGNOSIS — Z7901 Long term (current) use of anticoagulants: Secondary | ICD-10-CM

## 2013-06-04 DIAGNOSIS — I10 Essential (primary) hypertension: Secondary | ICD-10-CM

## 2013-06-04 DIAGNOSIS — I4891 Unspecified atrial fibrillation: Secondary | ICD-10-CM

## 2013-06-04 LAB — POCT INR: INR: 2.1

## 2013-06-04 NOTE — Progress Notes (Deleted)
Name: Brent Mason    DOB: February 07, 1946  Age: 67 y.o.  MR#: [id number]       PCP:  Cassell Smiles., MD      Insurance: Payor: [employer] [place] [employer] / Plan: BLUE MEDICARE / Product Type: *No Product type* /   CC:    Chief Complaint  Patient presents with  . Hypertension  . Atrial Fibrillation   STARTED TAKING 25MG  HCTZ QD PER KIDNEY MD VS Filed Vitals:   06/04/13 1136  BP: 121/84  Pulse: 95  Height: 6\' 2"  (1.88 m)  Weight: 279 lb 12 oz (126.894 kg)    Weights Current Weight  06/04/13 279 lb 12 oz (126.894 kg)  02/11/13 279 lb 3.2 oz (126.644 kg)  12/02/12 282 lb (127.914 kg)    Blood Pressure  BP Readings from Last 3 Encounters:  06/04/13 121/84  02/15/13 136/82  02/15/13 136/82     Admit date:  (Not on file) Last encounter with RMR:  Visit date not found   Allergy Review of patient's allergies indicates no known allergies.  Current Outpatient Prescriptions  Medication Sig Dispense Refill  . captopril (CAPOTEN) 50 MG tablet Take 25 mg by mouth 2 (two) times daily.      . cyclobenzaprine (FLEXERIL) 10 MG tablet Take 1 tablet (10 mg total) by mouth 3 (three) times daily as needed for muscle spasms.  80 tablet  1  . ferrous sulfate 325 (65 FE) MG tablet Take 325 mg by mouth daily with breakfast.        . fluticasone (FLONASE) 50 MCG/ACT nasal spray Place 1 spray into the nose daily.       . hydrochlorothiazide (HYDRODIURIL) 25 MG tablet Take 25 mg by mouth daily.       Marland Kitchen HYDROcodone-acetaminophen (NORCO) 5-325 MG per tablet Take 1 tablet by mouth every 6 (six) hours as needed for pain.  56 tablet  2  . naproxen (NAPROSYN) 500 MG tablet Take 500 mg by mouth as needed. Pain      . omeprazole (PRILOSEC) 20 MG capsule Take 20 mg by mouth 2 (two) times daily.        Marland Kitchen oxyCODONE-acetaminophen (PERCOCET/ROXICET) 5-325 MG per tablet Take 1-2 tablets by mouth every 4 (four) hours as needed.  80 tablet  0  . ramipril (ALTACE) 2.5 MG capsule Take 2.5 mg by mouth 2  (two) times daily.      . Tamsulosin HCl (FLOMAX) 0.4 MG CAPS Take 0.4 mg by mouth daily.      . theophylline (THEODUR) 100 MG 12 hr tablet Take 100 mg by mouth daily.       Marland Kitchen warfarin (COUMADIN) 5 MG tablet Take 2.5-5 mg by mouth as directed. 5mg  daily except 2.5mg  on Thursday      . zolpidem (AMBIEN) 10 MG tablet Take 10 mg by mouth at bedtime.        No current facility-administered medications for this visit.    Discontinued Meds:   There are no discontinued medications.  Patient Active Problem List   Diagnosis Date Noted  . Claudication of left lower extremity 12/02/2012  . Neck pain 09/16/2012  . Cervical spondylosis without myelopathy 09/10/2012  . Hx of adenomatous colonic polyps 05/07/2012  . Rectal bleeding 05/07/2012  . GERD (gastroesophageal reflux disease) 05/07/2012  . Long term current use of anticoagulant 01/25/2011  . HYPERLIPIDEMIA 11/21/2010  . DYSPNEA 10/20/2010  . MORBID OBESITY 09/01/2009  . Atrial fibrillation 09/01/2009  . HYPERTENSION  06/14/2009  . ESOPHAGEAL STRICTURE 06/14/2009  . DIZZINESS 06/14/2009    LABS    Component Value Date/Time   NA 138 02/11/2013 0843   NA 140 12/02/2012 1438   NA 140 11/28/2010 2014   K 3.8 02/11/2013 0843   K 4.2 12/02/2012 1438   K 4.0 11/28/2010 2014   CL 101 02/11/2013 0843   CL 101 12/02/2012 1438   CL 102 11/28/2010 2014   CO2 30 02/11/2013 0843   CO2 31 12/02/2012 1438   CO2 26 11/28/2010 2014   GLUCOSE 98 02/11/2013 0843   GLUCOSE 83 12/02/2012 1438   GLUCOSE 95 11/28/2010 2014   BUN 15 02/11/2013 0843   BUN 13 12/02/2012 1438   BUN 13 11/28/2010 2014   CREATININE 0.81 02/11/2013 0843   CREATININE 0.70 12/02/2012 1438   CREATININE 0.74 11/28/2010 2014   CREATININE 0.79 10/21/2010 0356   CALCIUM 9.6 02/11/2013 0843   CALCIUM 9.1 12/02/2012 1438   CALCIUM 9.1 11/28/2010 2014   GFRNONAA 90* 02/11/2013 0843   GFRNONAA >60 04/22/2010 1356   GFRAA >90 02/11/2013 0843   GFRAA  Value: >60        The eGFR has been calculated using  the MDRD equation. This calculation has not been validated in all clinical situations. eGFR's persistently <60 mL/min signify possible Chronic Kidney Disease. 04/22/2010 1356   CMP     Component Value Date/Time   NA 138 02/11/2013 0843   K 3.8 02/11/2013 0843   CL 101 02/11/2013 0843   CO2 30 02/11/2013 0843   GLUCOSE 98 02/11/2013 0843   BUN 15 02/11/2013 0843   CREATININE 0.81 02/11/2013 0843   CREATININE 0.70 12/02/2012 1438   CALCIUM 9.6 02/11/2013 0843   PROT 6.4 12/02/2012 1438   ALBUMIN 4.0 12/02/2012 1438   AST 20 12/02/2012 1438   ALT 16 12/02/2012 1438   ALKPHOS 55 12/02/2012 1438   BILITOT 0.8 12/02/2012 1438   GFRNONAA 90* 02/11/2013 0843   GFRAA >90 02/11/2013 0843       Component Value Date/Time   WBC 7.0 02/11/2013 0843   WBC 6.5 05/22/2012 1322   WBC 6.4 04/22/2010 1356   HGB 16.4 02/11/2013 0843   HGB 16.3 05/22/2012 1322   HGB 15.2 04/22/2010 1356   HCT 46.4 02/11/2013 0843   HCT 49.4 05/22/2012 1322   HCT 45.2 04/22/2010 1356   MCV 89.9 02/11/2013 0843   MCV 94.5 05/22/2012 1322   MCV 93.6 04/22/2010 1356    Lipid Panel     Component Value Date/Time   CHOL 165 12/02/2012 1438   TRIG 111 12/02/2012 1438   HDL 40 12/02/2012 1438   CHOLHDL 4.1 12/02/2012 1438   VLDL 22 12/02/2012 1438   LDLCALC 103* 12/02/2012 1438    ABG No results found for this basename: phart, pco2, pco2art, po2, po2art, hco3, tco2, acidbasedef, o2sat     No results found for this basename: TSH   BNP (last 3 results) No results found for this basename: PROBNP,  in the last 8760 hours Cardiac Panel (last 3 results) No results found for this basename: CKTOTAL, CKMB, TROPONINI, RELINDX,  in the last 72 hours  Iron/TIBC/Ferritin    Component Value Date/Time   IRON 118 05/22/2012 1322   TIBC 366 05/22/2012 1322   FERRITIN 216 05/22/2012 1322     EKG Orders placed in visit on 12/02/12  . EKG 12-LEAD     Prior Assessment and Plan Problem List as of 06/04/2013   MORBID  OBESITY   HYPERTENSION   Last Assessment &  Plan   12/02/2012 Office Visit Edited 12/02/2012  4:02 PM by Jodelle Gross, NP     Excellent control of BP at present. He is without evidence of dizziness or weakness. Will have BMET drawn for kidney fx.    Atrial fibrillation   Last Assessment & Plan   12/02/2012 Office Visit Written 12/02/2012  4:01 PM by Jodelle Gross, NP     Heart rate is well controlled currently. No changes in current medical regimen. He is to continue coumadin clinic evaluation for dosing.     ESOPHAGEAL STRICTURE   DIZZINESS   DYSPNEA   HYPERLIPIDEMIA   Last Assessment & Plan   12/02/2012 Office Visit Written 12/02/2012  4:03 PM by Jodelle Gross, NP     Fasting lipids and LFTs are ordered with copy to Dr. Sherwood Gambler.    Long term current use of anticoagulant   Hx of adenomatous colonic polyps   Rectal bleeding   Last Assessment & Plan   05/07/2012 Office Visit Edited 05/07/2012 12:01 PM by Tiffany Kocher, PA     Intermittent rectal bleeding in the setting of constipation and Coumadin therapy. Last colonoscopy in 2009. He has a history of adenomatous polyps on multiple procedures. At this time would offer colonoscopy.  I have discussed the risks, alternatives, benefits with regards to but not limited to the risk of reaction to medication, bleeding, infection, perforation and the patient is agreeable to proceed. Written consent to be obtained.  He will hold his iron for 7 days. He will hold his Coumadin for 4 days. We will touch base with [facility] to verify that it is okay to do this. Patient reports holding his Coumadin for prior procedures and surgeries.   Add MiraLax 17 g at bedtime on days he does not have a bowel movement.  I have requested labs from Dr. Sharyon Medicus office for review. Patient reports being on chronic iron therapy for "years", possibly decades since finding out that he was anemic and required blood transfusion. He does not recall any specific workup. Need to address whether or not he  still requires iron therapy. Further recommendations to follow once labs are reviewed.    GERD (gastroesophageal reflux disease)   Last Assessment & Plan   05/07/2012 Office Visit Written 05/07/2012 11:56 AM by Tiffany Kocher, PA     Doing very well on omeprazole twice a day. Patient denies recurrent esophageal dysphagia. He is not interested in endoscopy at this time.    Cervical spondylosis without myelopathy   Neck pain   Claudication of left lower extremity   Last Assessment & Plan   12/02/2012 Office Visit Written 12/02/2012  4:04 PM by Jodelle Gross, NP     Will plan segmental ABI's for evaluation of PAD.        Imaging: No results found.

## 2013-06-04 NOTE — Assessment & Plan Note (Signed)
Rate is well controlled, BP is stable. He will continue on current medication regimen without changes. Coumadin check today per Vashti Hey, RN. No changes on dosage.   Will see him in one year unless symptomatic.

## 2013-06-04 NOTE — Assessment & Plan Note (Signed)
Excellent control of BP today. He is going to the [facility] and doing mildly exertional exercises. Feels great with no issues of hypertension.

## 2013-06-04 NOTE — Patient Instructions (Addendum)
Your physician recommends that you schedule a follow-up appointment in: 1 year You will receive a reminder letter two months in advance reminding you to call and schedule your appointment. If you don't receive this letter, please contact our office.  Your physician recommends that you continue on your current medications as directed. Please refer to the Current Medication list given to you today.   

## 2013-06-10 ENCOUNTER — Encounter (INDEPENDENT_AMBULATORY_CARE_PROVIDER_SITE_OTHER): Payer: Self-pay | Admitting: *Deleted

## 2013-06-11 ENCOUNTER — Encounter (INDEPENDENT_AMBULATORY_CARE_PROVIDER_SITE_OTHER): Payer: Self-pay

## 2013-07-02 ENCOUNTER — Ambulatory Visit (INDEPENDENT_AMBULATORY_CARE_PROVIDER_SITE_OTHER): Payer: Medicare Other | Admitting: *Deleted

## 2013-07-02 DIAGNOSIS — I4891 Unspecified atrial fibrillation: Secondary | ICD-10-CM

## 2013-07-02 DIAGNOSIS — Z7901 Long term (current) use of anticoagulants: Secondary | ICD-10-CM

## 2013-07-02 LAB — POCT INR: INR: 2

## 2013-07-30 ENCOUNTER — Ambulatory Visit (INDEPENDENT_AMBULATORY_CARE_PROVIDER_SITE_OTHER): Payer: Medicare Other | Admitting: *Deleted

## 2013-07-30 DIAGNOSIS — Z7901 Long term (current) use of anticoagulants: Secondary | ICD-10-CM

## 2013-07-30 DIAGNOSIS — I4891 Unspecified atrial fibrillation: Secondary | ICD-10-CM

## 2013-07-30 LAB — POCT INR: INR: 2.4

## 2013-08-27 ENCOUNTER — Ambulatory Visit (INDEPENDENT_AMBULATORY_CARE_PROVIDER_SITE_OTHER): Payer: Medicare Other | Admitting: *Deleted

## 2013-08-27 DIAGNOSIS — Z7901 Long term (current) use of anticoagulants: Secondary | ICD-10-CM

## 2013-08-27 DIAGNOSIS — I4891 Unspecified atrial fibrillation: Secondary | ICD-10-CM

## 2013-08-27 LAB — POCT INR: INR: 2.6

## 2013-09-18 ENCOUNTER — Telehealth: Payer: Self-pay | Admitting: *Deleted

## 2013-09-18 MED ORDER — WARFARIN SODIUM 5 MG PO TABS: 2.5000 mg | ORAL_TABLET | ORAL | Status: DC

## 2013-09-18 NOTE — Telephone Encounter (Signed)
Pt needs warfarin called in to [facility] took last pill last night. Please call in asap.  Please call pt when it has been called it.

## 2013-09-18 NOTE — Telephone Encounter (Signed)
Medication sent via escribe. Pt is aware. 

## 2013-09-26 ENCOUNTER — Other Ambulatory Visit: Payer: Self-pay | Admitting: Neurosurgery

## 2013-09-26 DIAGNOSIS — M47812 Spondylosis without myelopathy or radiculopathy, cervical region: Secondary | ICD-10-CM

## 2013-10-10 ENCOUNTER — Ambulatory Visit
Admission: RE | Admit: 2013-10-10 | Discharge: 2013-10-10 | Disposition: A | Discharge status: Home / Self Care | Payer: Medicare Other | Source: Ambulatory Visit | Attending: Neurosurgery | Admitting: Neurosurgery

## 2013-10-10 DIAGNOSIS — M47812 Spondylosis without myelopathy or radiculopathy, cervical region: Secondary | ICD-10-CM

## 2013-10-13 ENCOUNTER — Ambulatory Visit (INDEPENDENT_AMBULATORY_CARE_PROVIDER_SITE_OTHER): Payer: Medicare Other | Admitting: *Deleted

## 2013-10-13 DIAGNOSIS — I4891 Unspecified atrial fibrillation: Secondary | ICD-10-CM

## 2013-10-13 DIAGNOSIS — Z7901 Long term (current) use of anticoagulants: Secondary | ICD-10-CM

## 2013-10-13 LAB — POCT INR: INR: 1.8

## 2013-11-10 ENCOUNTER — Encounter: Payer: Self-pay | Admitting: *Deleted

## 2013-11-13 ENCOUNTER — Encounter (INDEPENDENT_AMBULATORY_CARE_PROVIDER_SITE_OTHER): Payer: Self-pay

## 2013-11-13 ENCOUNTER — Ambulatory Visit (INDEPENDENT_AMBULATORY_CARE_PROVIDER_SITE_OTHER): Payer: Medicare Other | Admitting: *Deleted

## 2013-11-13 DIAGNOSIS — Z5181 Encounter for therapeutic drug level monitoring: Secondary | ICD-10-CM

## 2013-11-13 DIAGNOSIS — Z7901 Long term (current) use of anticoagulants: Secondary | ICD-10-CM

## 2013-11-13 DIAGNOSIS — I4891 Unspecified atrial fibrillation: Secondary | ICD-10-CM

## 2013-11-13 LAB — POCT INR: INR: 1.2

## 2013-11-19 ENCOUNTER — Telehealth: Payer: Self-pay | Admitting: Adult Health

## 2013-11-19 ENCOUNTER — Telehealth: Payer: Self-pay | Admitting: *Deleted

## 2013-11-19 MED ORDER — WARFARIN SODIUM 5 MG PO TABS: ORAL_TABLET | ORAL | Status: DC

## 2013-11-19 NOTE — Telephone Encounter (Signed)
Brent Mason [phone] f [phone] Warfarin 5mg  #30 1 tab once day, except 1/2 tab thursday

## 2013-11-19 NOTE — Telephone Encounter (Signed)
Received fax refill request  Rx # [id number] Medication:  Warfarin Soduim 5 mg tab Qty 30 Sig:  Take one tablet by mouth once daily, except Thursday take 1/2 tablet Physician:  Purcell Nails

## 2013-11-27 ENCOUNTER — Ambulatory Visit (INDEPENDENT_AMBULATORY_CARE_PROVIDER_SITE_OTHER): Payer: Medicare Other | Admitting: *Deleted

## 2013-11-27 DIAGNOSIS — I4891 Unspecified atrial fibrillation: Secondary | ICD-10-CM

## 2013-11-27 DIAGNOSIS — Z5181 Encounter for therapeutic drug level monitoring: Secondary | ICD-10-CM

## 2013-11-27 DIAGNOSIS — Z7901 Long term (current) use of anticoagulants: Secondary | ICD-10-CM

## 2013-11-27 LAB — POCT INR: INR: 2.9

## 2013-12-25 ENCOUNTER — Ambulatory Visit (INDEPENDENT_AMBULATORY_CARE_PROVIDER_SITE_OTHER): Payer: Medicare Other | Admitting: *Deleted

## 2013-12-25 DIAGNOSIS — Z7901 Long term (current) use of anticoagulants: Secondary | ICD-10-CM

## 2013-12-25 DIAGNOSIS — I4891 Unspecified atrial fibrillation: Secondary | ICD-10-CM

## 2013-12-25 DIAGNOSIS — Z5181 Encounter for therapeutic drug level monitoring: Secondary | ICD-10-CM

## 2013-12-25 LAB — POCT INR: INR: 2.2

## 2014-01-22 ENCOUNTER — Ambulatory Visit (INDEPENDENT_AMBULATORY_CARE_PROVIDER_SITE_OTHER): Payer: Medicare Other | Admitting: *Deleted

## 2014-01-22 DIAGNOSIS — Z5181 Encounter for therapeutic drug level monitoring: Secondary | ICD-10-CM

## 2014-01-22 DIAGNOSIS — Z7901 Long term (current) use of anticoagulants: Secondary | ICD-10-CM

## 2014-01-22 DIAGNOSIS — I4891 Unspecified atrial fibrillation: Secondary | ICD-10-CM

## 2014-01-22 LAB — POCT INR: INR: 2.8

## 2014-03-05 ENCOUNTER — Ambulatory Visit (INDEPENDENT_AMBULATORY_CARE_PROVIDER_SITE_OTHER): Payer: Medicare Other | Admitting: *Deleted

## 2014-03-05 DIAGNOSIS — I4891 Unspecified atrial fibrillation: Secondary | ICD-10-CM

## 2014-03-05 DIAGNOSIS — Z5181 Encounter for therapeutic drug level monitoring: Secondary | ICD-10-CM

## 2014-03-05 DIAGNOSIS — Z7901 Long term (current) use of anticoagulants: Secondary | ICD-10-CM

## 2014-03-05 LAB — POCT INR: INR: 3

## 2014-04-16 ENCOUNTER — Ambulatory Visit (INDEPENDENT_AMBULATORY_CARE_PROVIDER_SITE_OTHER): Payer: Medicare Other | Admitting: *Deleted

## 2014-04-16 DIAGNOSIS — Z5181 Encounter for therapeutic drug level monitoring: Secondary | ICD-10-CM

## 2014-04-16 DIAGNOSIS — I4891 Unspecified atrial fibrillation: Secondary | ICD-10-CM

## 2014-04-16 DIAGNOSIS — I482 Chronic atrial fibrillation, unspecified: Secondary | ICD-10-CM

## 2014-04-16 DIAGNOSIS — Z7901 Long term (current) use of anticoagulants: Secondary | ICD-10-CM

## 2014-04-16 LAB — POCT INR: INR: 2.6

## 2014-05-28 ENCOUNTER — Ambulatory Visit (INDEPENDENT_AMBULATORY_CARE_PROVIDER_SITE_OTHER): Payer: Medicare Other | Admitting: *Deleted

## 2014-05-28 DIAGNOSIS — I4891 Unspecified atrial fibrillation: Secondary | ICD-10-CM

## 2014-05-28 DIAGNOSIS — Z7901 Long term (current) use of anticoagulants: Secondary | ICD-10-CM

## 2014-05-28 DIAGNOSIS — Z5181 Encounter for therapeutic drug level monitoring: Secondary | ICD-10-CM

## 2014-05-28 LAB — POCT INR: INR: 2.7

## 2014-06-23 ENCOUNTER — Telehealth: Payer: Self-pay | Admitting: Adult Health

## 2014-06-23 MED ORDER — WARFARIN SODIUM 5 MG PO TABS: ORAL_TABLET | ORAL | Status: DC

## 2014-06-23 NOTE — Telephone Encounter (Signed)
Pt requesting warfarin to [facility] for clarification received faxed faxed to [facility] for Lattie Haw, coumadin nurse to review.

## 2014-06-23 NOTE — Telephone Encounter (Signed)
Please see paper in refill bin / tgs  °

## 2014-07-08 ENCOUNTER — Ambulatory Visit (INDEPENDENT_AMBULATORY_CARE_PROVIDER_SITE_OTHER): Payer: Medicare Other | Admitting: Physician Assistant

## 2014-07-08 ENCOUNTER — Ambulatory Visit (INDEPENDENT_AMBULATORY_CARE_PROVIDER_SITE_OTHER): Payer: Medicare Other | Admitting: *Deleted

## 2014-07-08 ENCOUNTER — Encounter: Payer: Self-pay | Admitting: Physician Assistant

## 2014-07-08 VITALS — BP 148/90 | HR 50 | Ht 74.0 in | Wt 272.0 lb

## 2014-07-08 DIAGNOSIS — Z5181 Encounter for therapeutic drug level monitoring: Secondary | ICD-10-CM

## 2014-07-08 DIAGNOSIS — R0989 Other specified symptoms and signs involving the circulatory and respiratory systems: Secondary | ICD-10-CM

## 2014-07-08 DIAGNOSIS — I4891 Unspecified atrial fibrillation: Secondary | ICD-10-CM

## 2014-07-08 DIAGNOSIS — Z7901 Long term (current) use of anticoagulants: Secondary | ICD-10-CM

## 2014-07-08 DIAGNOSIS — I10 Essential (primary) hypertension: Secondary | ICD-10-CM

## 2014-07-08 DIAGNOSIS — I482 Chronic atrial fibrillation, unspecified: Secondary | ICD-10-CM

## 2014-07-08 LAB — POCT INR: INR: 2.7

## 2014-07-08 NOTE — Assessment & Plan Note (Signed)
Check carotid Dopplers °

## 2014-07-08 NOTE — Assessment & Plan Note (Signed)
Blood pressure is elevated today. Patient claims it was not elevated when he saw the urologist last week. He is eating out every meal, not exercising because of taking care of his sick wife. Recommend 2 g sodium diet, exercise and weight loss. BP check in 2 weeks.

## 2014-07-08 NOTE — Assessment & Plan Note (Signed)
Rate controlled. Continue Coumadin. He is  Not on any rate lowering medications but has not required any.

## 2014-07-08 NOTE — Assessment & Plan Note (Signed)
Weight loss recommended 

## 2014-07-08 NOTE — Progress Notes (Signed)
HPI: This is a 68 year old male prior patient of Dr. Lovena Le who has history of chronic atrial fibrillation on Coumadin, and hypertension. He has not been seen in over a year. 2-D echo in 2012 showed normal LV function.  Patient comes in today for yearly checkup. His blood pressure is elevated and he says it's because he's never she around taking care of his wife who has been sick. He can no longer wear the gym because he is taking care of her he is also eating out every meal in places such as short sugars and pizza. He denies any chest pain, palpitations, dyspnea, dyspnea on exertion, dizziness, or presyncope. He is on antibiotic for UTI.  No Known Allergies   Current Outpatient Prescriptions  Medication Sig Dispense Refill  . captopril (CAPOTEN) 50 MG tablet Take 25 mg by mouth 2 (two) times daily.      . cyclobenzaprine (FLEXERIL) 10 MG tablet Take 1 tablet (10 mg total) by mouth 3 (three) times daily as needed for muscle spasms.  80 tablet  1  . ferrous sulfate 325 (65 FE) MG tablet Take 325 mg by mouth daily with breakfast.        . fluticasone (FLONASE) 50 MCG/ACT nasal spray Place 1 spray into the nose daily.       . hydrochlorothiazide (HYDRODIURIL) 25 MG tablet Take 25 mg by mouth daily.       Marland Kitchen HYDROcodone-acetaminophen (NORCO) 5-325 MG per tablet Take 1 tablet by mouth every 6 (six) hours as needed for pain.  56 tablet  2  . naproxen (NAPROSYN) 500 MG tablet Take 500 mg by mouth as needed. Pain      . omeprazole (PRILOSEC) 20 MG capsule Take 20 mg by mouth 2 (two) times daily.        Marland Kitchen oxyCODONE-acetaminophen (PERCOCET/ROXICET) 5-325 MG per tablet Take 1-2 tablets by mouth every 4 (four) hours as needed.  80 tablet  0  . ramipril (ALTACE) 2.5 MG capsule Take 2.5 mg by mouth 2 (two) times daily.      . Tamsulosin HCl (FLOMAX) 0.4 MG CAPS Take 0.4 mg by mouth daily.      . theophylline (THEODUR) 100 MG 12 hr tablet Take 100 mg by mouth daily.       Marland Kitchen warfarin (COUMADIN) 5  MG tablet 5mg  daily except 2.5mg  on Tuesdays and Fridays  30 tablet  6  . zolpidem (AMBIEN) 10 MG tablet Take 10 mg by mouth at bedtime.        No current facility-administered medications for this visit.    Past Medical History  Diagnosis Date  . Hypertension   . Esophageal stricture     several times and food impaction once per patient Lady Gary)  . Dizziness   . A-fib   . Anemia     remote, required transfusion. pt has taken chronic iron since  . Shortness of breath   . Sleep apnea     last sleep study > 5 yrs   no cpap  . Chronic kidney disease     freg uti.        . Atrial fibrillation status post cardioversion   . H/O hiatal hernia   . Arthritis     Past Surgical History  Procedure Laterality Date  . Volvulus reduction  [id number]  . Colonoscopy  2009    [facility]-Dr. Laural Golden. three polyps removed. Only two were tubular adenomas/adenomas.  . Foot surgery  2011  right  . Colonoscopy  06/03/2012    Procedure: COLONOSCOPY;  Surgeon: Daneil Dolin, MD;  [facility];  Service: Endoscopy;  Laterality: N/A;  8:30  . Hernia repair    . Anterior cervical decomp/discectomy fusion N/A 02/14/2013    Procedure: ANTERIOR CERVICAL DECOMPRESSION/DISCECTOMY FUSION 2 LEVELS;  Surgeon: Charlie Pitter, MD;  Location: [facility];  Service: Neurosurgery;  Laterality: N/A;    Family History  Problem Relation Age of Onset  . Heart disease Mother     Pacemaker  . Diabetes Father   . Colon cancer Neg Hx     History   Social History  . Marital Status: Married    Spouse Name: N/A    Number of Children: N/A  . Years of Education: N/A   Occupational History  . LABORER     full time   Social History Main Topics  . Smoking status: Never Smoker   . Smokeless tobacco: Never Used  . Alcohol Use: No  . Drug Use: No  . Sexual Activity: Not on file   Other Topics Concern  . Not on file   Social History Narrative   No regular exercise    ROS: See history of present  illness otherwise negative  Ht 6\' 2"  (1.88 m)  Wt 272 lb (123.378 kg)  BMI 34.91 kg/m2  PHYSICAL EXAM: Obese, in no acute distress. Neck: Bilateral carotid bruits right greater than left, No JVD, HJR, or thyroid enlargement  Lungs: No tachypnea, clear without wheezing, rales, or rhonchi  Cardiovascular: Irregular irregular, PMI not displaced, 1/6 systolic murmur at the left sternal border, no gallops, bruit, thrill, or heave.  Abdomen: BS normal. Soft without organomegaly, masses, lesions or tenderness.  Extremities: without cyanosis, clubbing or edema. Good distal pulses bilateral  SKin: Warm, no lesions or rashes   Musculoskeletal: No deformities  Neuro: no focal signs   Wt Readings from Last 3 Encounters:  07/08/14 272 lb (123.378 kg)  06/04/13 279 lb 12 oz (126.894 kg)  02/11/13 279 lb 3.2 oz (126.644 kg)     EKG: Atrial fibrillation at 84 beats per minute PVCs, old septal infarct, no acute change  2-D echo 2012 Study Conclusions   - Left ventricle: The cavity size was normal. There was mild    concentric hypertrophy. Systolic function was normal. The    estimated ejection fraction was in the range of 55% to 60%. Wall    motion was normal; there were no regional wall motion    abnormalities.  - Aortic valve: Mild regurgitation.  - Mitral valve: Calcified annulus.  - Left atrium: The atrium was moderately dilated.  - Right atrium: The atrium was mildly to moderately dilated.  - Atrial septum: No defect or patent foramen ovale was identified.  Impressions:   - Compared to the prior study performed 01/03/06, there has been no    significant interval change. Technical quality of present study is    substantially improved.

## 2014-07-08 NOTE — Patient Instructions (Signed)
Your physician wants you to follow-up in: 6 months with Dr.Taylor You will receive a reminder letter in the mail two months in advance. If you don't receive a letter, please call our office to schedule the follow-up appointment.   Your physician recommends that you continue on your current medications as directed. Please refer to the Current Medication list given to you today.    Your physician has requested that you have a carotid duplex. This test is an ultrasound of the carotid arteries in your neck. It looks at blood flow through these arteries that supply the brain with blood. Allow one hour for this exam. There are no restrictions or special instructions.    Please follow 2 Gm low sodium diet I have given you   Return in 2 weeks for nurse BP check,record BP daily at same time and bring with you to apt      Thank you for choosing [facility] !

## 2014-07-14 ENCOUNTER — Ambulatory Visit (HOSPITAL_COMMUNITY)
Admission: RE | Admit: 2014-07-14 | Discharge: 2014-07-14 | Disposition: A | Discharge status: Home / Self Care | Payer: Medicare Other | Source: Ambulatory Visit | Attending: Physician Assistant | Admitting: Physician Assistant

## 2014-07-14 DIAGNOSIS — R0989 Other specified symptoms and signs involving the circulatory and respiratory systems: Secondary | ICD-10-CM | POA: Insufficient documentation

## 2014-07-15 ENCOUNTER — Telehealth: Payer: Self-pay | Admitting: *Deleted

## 2014-07-15 NOTE — Telephone Encounter (Signed)
Pt notified, forwarded to Dr. Fusco 

## 2014-07-15 NOTE — Telephone Encounter (Signed)
Will forward to [employer]

## 2014-07-15 NOTE — Telephone Encounter (Signed)
Staci, not sure why I didn't receive these results in my inbasket? Can you look into it? Thanks.  Please let patient know carotid dopplers showed less than 50% stenosis and are stable   IMPRESSION:  Less than 50% stenosis in the right and left internal carotid  arteries.

## 2014-07-15 NOTE — Telephone Encounter (Signed)
Pt had ultrasound done last week and has not heard back. He states they were looking for blood clots. Pt is concerned/tmj

## 2014-07-24 ENCOUNTER — Telehealth: Payer: Self-pay

## 2014-07-24 ENCOUNTER — Telehealth: Payer: Self-pay | Admitting: Adult Health

## 2014-07-24 MED ORDER — CAPTOPRIL 50 MG PO TABS: ORAL_TABLET | ORAL | Status: DC

## 2014-07-24 NOTE — Telephone Encounter (Signed)
Placed BP log on providers desk for review

## 2014-07-24 NOTE — Telephone Encounter (Signed)
Patient has been recording BP's and would like for Curt Bears to look at them and see if he needs to change his medications.  He is taking Catopril prescribed by Dr.Fusco.  Please call patient back with any instructions. / tgs

## 2014-07-24 NOTE — Telephone Encounter (Signed)
Pt dropped off BP log,reviewed by K.Lawrence NP and found BP to be increased running [phone]/JIR  67'E diastolic    Increased Captopril to 50 mg am, 25 mg am     Pt will continue to record BP and return in 10 days with log for review

## 2014-08-19 ENCOUNTER — Ambulatory Visit: Payer: Medicare Other | Admitting: *Deleted

## 2014-08-19 ENCOUNTER — Ambulatory Visit (INDEPENDENT_AMBULATORY_CARE_PROVIDER_SITE_OTHER): Payer: Medicare Other | Admitting: *Deleted

## 2014-08-19 VITALS — BP 138/86 | HR 68

## 2014-08-19 DIAGNOSIS — Z5181 Encounter for therapeutic drug level monitoring: Secondary | ICD-10-CM

## 2014-08-19 DIAGNOSIS — I4891 Unspecified atrial fibrillation: Secondary | ICD-10-CM

## 2014-08-19 DIAGNOSIS — Z7901 Long term (current) use of anticoagulants: Secondary | ICD-10-CM

## 2014-08-19 LAB — POCT INR: INR: 2.9

## 2014-08-19 NOTE — Progress Notes (Signed)
Pt BP 138/86 pt has BP log. Made copy for review in [place], [place] folder. Pt had no complaints, was concerned about BP he is taking himself at home.

## 2014-08-19 NOTE — Patient Instructions (Signed)
To be determined.

## 2014-09-30 ENCOUNTER — Ambulatory Visit (INDEPENDENT_AMBULATORY_CARE_PROVIDER_SITE_OTHER): Payer: Medicare Other | Admitting: *Deleted

## 2014-09-30 ENCOUNTER — Telehealth: Payer: Self-pay | Admitting: Physician Assistant

## 2014-09-30 DIAGNOSIS — Z7901 Long term (current) use of anticoagulants: Secondary | ICD-10-CM

## 2014-09-30 DIAGNOSIS — I4891 Unspecified atrial fibrillation: Secondary | ICD-10-CM

## 2014-09-30 DIAGNOSIS — Z5181 Encounter for therapeutic drug level monitoring: Secondary | ICD-10-CM

## 2014-09-30 LAB — POCT INR: INR: 2.8

## 2014-09-30 MED ORDER — CAPTOPRIL 50 MG PO TABS: 50.0000 mg | ORAL_TABLET | Freq: Three times a day (TID) | ORAL | Status: DC

## 2014-09-30 NOTE — Addendum Note (Signed)
Addended by: Julian Hy T on: 09/30/2014 02:06 PM   Modules accepted: Orders, Medications

## 2014-09-30 NOTE — Telephone Encounter (Signed)
Pt made aware. Will take captopril 50mg  3 times daily. Will keep BP log and come back in 2 weeks for nurse BP visit. Sent message to schedulers to schedule BP nurse visit

## 2014-09-30 NOTE — Telephone Encounter (Signed)
Patients BP still elevated at home based on list he brought in. Increase Capoten 50mg  Twice a day. 2 gm sodium diet. Come back in 2 weeks for BP check. May need to increase it further or change drug.

## 2014-10-01 ENCOUNTER — Encounter: Payer: Self-pay | Admitting: Physician Assistant

## 2014-11-11 ENCOUNTER — Ambulatory Visit (INDEPENDENT_AMBULATORY_CARE_PROVIDER_SITE_OTHER): Payer: BLUE CROSS/BLUE SHIELD | Admitting: *Deleted

## 2014-11-11 DIAGNOSIS — I4891 Unspecified atrial fibrillation: Secondary | ICD-10-CM

## 2014-11-11 DIAGNOSIS — Z5181 Encounter for therapeutic drug level monitoring: Secondary | ICD-10-CM

## 2014-11-11 DIAGNOSIS — Z7901 Long term (current) use of anticoagulants: Secondary | ICD-10-CM

## 2014-11-11 LAB — POCT INR: INR: 2.3

## 2014-11-25 ENCOUNTER — Encounter: Payer: Self-pay | Admitting: Internal Medicine

## 2014-12-23 ENCOUNTER — Ambulatory Visit (INDEPENDENT_AMBULATORY_CARE_PROVIDER_SITE_OTHER): Payer: BLUE CROSS/BLUE SHIELD | Admitting: *Deleted

## 2014-12-23 DIAGNOSIS — I480 Paroxysmal atrial fibrillation: Secondary | ICD-10-CM

## 2014-12-23 DIAGNOSIS — Z7901 Long term (current) use of anticoagulants: Secondary | ICD-10-CM

## 2014-12-23 DIAGNOSIS — I4891 Unspecified atrial fibrillation: Secondary | ICD-10-CM

## 2014-12-23 DIAGNOSIS — Z5181 Encounter for therapeutic drug level monitoring: Secondary | ICD-10-CM

## 2014-12-23 LAB — POCT INR: INR: 2

## 2014-12-25 ENCOUNTER — Ambulatory Visit: Payer: Self-pay | Admitting: Internal Medicine

## 2015-02-01 ENCOUNTER — Ambulatory Visit (INDEPENDENT_AMBULATORY_CARE_PROVIDER_SITE_OTHER): Payer: Medicare Other | Admitting: *Deleted

## 2015-02-01 ENCOUNTER — Encounter: Payer: Self-pay | Admitting: Internal Medicine

## 2015-02-01 ENCOUNTER — Ambulatory Visit (INDEPENDENT_AMBULATORY_CARE_PROVIDER_SITE_OTHER): Payer: Medicare Other | Admitting: Internal Medicine

## 2015-02-01 VITALS — BP 136/76 | HR 81 | Ht 74.0 in | Wt 282.0 lb

## 2015-02-01 DIAGNOSIS — I482 Chronic atrial fibrillation, unspecified: Secondary | ICD-10-CM

## 2015-02-01 DIAGNOSIS — Z7901 Long term (current) use of anticoagulants: Secondary | ICD-10-CM | POA: Diagnosis not present

## 2015-02-01 DIAGNOSIS — I480 Paroxysmal atrial fibrillation: Secondary | ICD-10-CM

## 2015-02-01 DIAGNOSIS — I10 Essential (primary) hypertension: Secondary | ICD-10-CM

## 2015-02-01 DIAGNOSIS — I4891 Unspecified atrial fibrillation: Secondary | ICD-10-CM | POA: Diagnosis not present

## 2015-02-01 DIAGNOSIS — Z5181 Encounter for therapeutic drug level monitoring: Secondary | ICD-10-CM

## 2015-02-01 LAB — POCT INR: INR: 2.7

## 2015-02-01 NOTE — Assessment & Plan Note (Signed)
His blood pressure is well controlled. He is encouraged to lose weight and continue his regular physical activity.

## 2015-02-01 NOTE — Assessment & Plan Note (Signed)
His ventricular rate is well controlled. He will continue his current meds. 

## 2015-02-01 NOTE — Progress Notes (Signed)
HPI [name] returns today for followup. He is a pleasant 69 yo man with chronic atrial fib, HTN, obesity, and sleep apnea. In the interim, he has been stable although he is worried about his wife who has had many medical problems. No edema, chest pain or sob.  No Known Allergies   Current Outpatient Prescriptions  Medication Sig Dispense Refill  . atorvastatin (LIPITOR) 10 MG tablet Take 10 mg by mouth daily at 6 PM.   10  . captopril (CAPOTEN) 50 MG tablet Take 1 tablet (50 mg total) by mouth 3 (three) times daily. 90 tablet 3  . cyclobenzaprine (FLEXERIL) 10 MG tablet Take 1 tablet (10 mg total) by mouth 3 (three) times daily as needed for muscle spasms. 80 tablet 1  . ferrous sulfate 325 (65 FE) MG tablet Take 325 mg by mouth daily with breakfast.      . fluticasone (FLONASE) 50 MCG/ACT nasal spray Place 1 spray into the nose daily.     . hydrochlorothiazide (HYDRODIURIL) 25 MG tablet Take 25 mg by mouth daily.     Marland Kitchen HYDROcodone-acetaminophen (NORCO) 5-325 MG per tablet Take 1 tablet by mouth every 6 (six) hours as needed for pain. 56 tablet 2  . naproxen (NAPROSYN) 500 MG tablet Take 500 mg by mouth as needed. Pain    . omeprazole (PRILOSEC) 20 MG capsule Take 20 mg by mouth 2 (two) times daily.      . Tamsulosin HCl (FLOMAX) 0.4 MG CAPS Take 0.4 mg by mouth daily.    Marland Kitchen warfarin (COUMADIN) 5 MG tablet 5mg  daily except 2.5mg  on Tuesdays and Fridays 30 tablet 6   No current facility-administered medications for this visit.     Past Medical History  Diagnosis Date  . Hypertension   . Esophageal stricture     several times and food impaction once per patient Lady Gary)  . Dizziness   . A-fib   . Anemia     remote, required transfusion. pt has taken chronic iron since  . Shortness of breath   . Sleep apnea     last sleep study > 5 yrs   no cpap  . Chronic kidney disease     freg uti.        . Atrial fibrillation status post cardioversion   . H/O hiatal hernia     . Arthritis     ROS:   All systems reviewed and negative except as noted in the HPI.   Past Surgical History  Procedure Laterality Date  . Volvulus reduction  [id number]  . Colonoscopy  2009    [facility]-Dr. Laural Golden. three polyps removed. Only two were tubular adenomas/adenomas.  . Foot surgery  2011    right  . Colonoscopy  06/03/2012    Procedure: COLONOSCOPY;  Surgeon: Daneil Dolin, MD;  [facility];  Service: Endoscopy;  Laterality: N/A;  8:30  . Hernia repair    . Anterior cervical decomp/discectomy fusion N/A 02/14/2013    Procedure: ANTERIOR CERVICAL DECOMPRESSION/DISCECTOMY FUSION 2 LEVELS;  Surgeon: Charlie Pitter, MD;  Location: [facility];  Service: Neurosurgery;  Laterality: N/A;     Family History  Problem Relation Age of Onset  . Heart disease Mother     Pacemaker  . Diabetes Father   . Colon cancer Neg Hx      History   Social History  . Marital Status: Married    Spouse Name: N/A  . Number of Children: N/A  .  Years of Education: N/A   Occupational History  . LABORER     full time   Social History Main Topics  . Smoking status: Never Smoker   . Smokeless tobacco: Never Used  . Alcohol Use: No  . Drug Use: No  . Sexual Activity: Not on file   Other Topics Concern  . Not on file   Social History Narrative   No regular exercise     BP 136/76 mmHg  Pulse 81  Ht 6\' 2"  (1.88 m)  Wt 282 lb (127.914 kg)  BMI 36.19 kg/m2  SpO2 98%  Physical Exam:  Well appearing 69 yo man, NAD HEENT: Unremarkable Neck:  No JVD, no thyromegally Lymphatics:  No adenopathy Back:  No CVA tenderness Lungs:  Clear with no wheezes HEART:  Regular rate rhythm, no murmurs, no rubs, no clicks Abd:  soft, positive bowel sounds, no organomegally, no rebound, no guarding Ext:  2 plus pulses, no edema, no cyanosis, no clubbing Skin:  No rashes no nodules Neuro:  CN II through XII intact, motor grossly intact   Assess/Plan:

## 2015-02-01 NOTE — Assessment & Plan Note (Signed)
I have asked the patient to lose weight. He will try to reduce his caloric intake. He admits to dietary indiscretion.

## 2015-02-01 NOTE — Patient Instructions (Signed)
Your physician wants you to follow-up in: 1 year with Dr. Taylor. You will receive a reminder letter in the mail two months in advance. If you don't receive a letter, please call our office to schedule the follow-up appointment.  Your physician recommends that you continue on your current medications as directed. Please refer to the Current Medication list given to you today.  Thank you for choosing [facility]!   

## 2015-02-05 ENCOUNTER — Other Ambulatory Visit: Payer: Self-pay | Admitting: Physician Assistant

## 2015-02-05 ENCOUNTER — Other Ambulatory Visit: Payer: Self-pay | Admitting: Adult Health

## 2015-03-17 ENCOUNTER — Ambulatory Visit (INDEPENDENT_AMBULATORY_CARE_PROVIDER_SITE_OTHER): Payer: Medicare Other | Admitting: *Deleted

## 2015-03-17 DIAGNOSIS — I4891 Unspecified atrial fibrillation: Secondary | ICD-10-CM

## 2015-03-17 DIAGNOSIS — Z5181 Encounter for therapeutic drug level monitoring: Secondary | ICD-10-CM

## 2015-03-17 DIAGNOSIS — Z7901 Long term (current) use of anticoagulants: Secondary | ICD-10-CM

## 2015-03-17 LAB — POCT INR: INR: 2.2

## 2015-04-28 ENCOUNTER — Ambulatory Visit (INDEPENDENT_AMBULATORY_CARE_PROVIDER_SITE_OTHER): Payer: Medicare Other | Admitting: *Deleted

## 2015-04-28 ENCOUNTER — Other Ambulatory Visit: Payer: Self-pay | Admitting: Internal Medicine

## 2015-04-28 DIAGNOSIS — Z5181 Encounter for therapeutic drug level monitoring: Secondary | ICD-10-CM

## 2015-04-28 DIAGNOSIS — I4891 Unspecified atrial fibrillation: Secondary | ICD-10-CM | POA: Diagnosis not present

## 2015-04-28 DIAGNOSIS — Z7901 Long term (current) use of anticoagulants: Secondary | ICD-10-CM | POA: Diagnosis not present

## 2015-04-28 LAB — POCT INR: INR: 1.8

## 2015-05-24 ENCOUNTER — Other Ambulatory Visit: Payer: Self-pay | Admitting: Adult Health

## 2015-06-19 ENCOUNTER — Emergency Department (HOSPITAL_COMMUNITY)
Admission: EM | Admit: 2015-06-19 | Discharge: 2015-06-19 | Disposition: A | Discharge status: Home / Self Care | Payer: Medicare Other | Attending: Emergency Medicine | Admitting: Emergency Medicine

## 2015-06-19 ENCOUNTER — Encounter (HOSPITAL_COMMUNITY): Payer: Self-pay | Admitting: Emergency Medicine

## 2015-06-19 DIAGNOSIS — K1379 Other lesions of oral mucosa: Secondary | ICD-10-CM | POA: Diagnosis present

## 2015-06-19 DIAGNOSIS — Z7951 Long term (current) use of inhaled steroids: Secondary | ICD-10-CM | POA: Insufficient documentation

## 2015-06-19 DIAGNOSIS — M1990 Unspecified osteoarthritis, unspecified site: Secondary | ICD-10-CM | POA: Diagnosis not present

## 2015-06-19 DIAGNOSIS — I129 Hypertensive chronic kidney disease with stage 1 through stage 4 chronic kidney disease, or unspecified chronic kidney disease: Secondary | ICD-10-CM | POA: Insufficient documentation

## 2015-06-19 DIAGNOSIS — K130 Diseases of lips: Secondary | ICD-10-CM | POA: Insufficient documentation

## 2015-06-19 DIAGNOSIS — Z7901 Long term (current) use of anticoagulants: Secondary | ICD-10-CM | POA: Diagnosis not present

## 2015-06-19 DIAGNOSIS — N189 Chronic kidney disease, unspecified: Secondary | ICD-10-CM | POA: Insufficient documentation

## 2015-06-19 DIAGNOSIS — D649 Anemia, unspecified: Secondary | ICD-10-CM | POA: Diagnosis not present

## 2015-06-19 DIAGNOSIS — Z8669 Personal history of other diseases of the nervous system and sense organs: Secondary | ICD-10-CM | POA: Diagnosis not present

## 2015-06-19 DIAGNOSIS — R58 Hemorrhage, not elsewhere classified: Secondary | ICD-10-CM

## 2015-06-19 DIAGNOSIS — Z79899 Other long term (current) drug therapy: Secondary | ICD-10-CM | POA: Insufficient documentation

## 2015-06-19 LAB — CBC WITH DIFFERENTIAL/PLATELET
BASOS ABS: 0.1 10*3/uL (ref 0.0–0.1)
BASOS PCT: 1 % (ref 0–1)
EOS ABS: 0.7 10*3/uL (ref 0.0–0.7)
EOS PCT: 9 % — AB (ref 0–5)
HCT: 47.4 % (ref 39.0–52.0)
HEMOGLOBIN: 16.2 g/dL (ref 13.0–17.0)
LYMPHS ABS: 1.7 10*3/uL (ref 0.7–4.0)
Lymphocytes Relative: 21 % (ref 12–46)
MCH: 32 pg (ref 26.0–34.0)
MCHC: 34.2 g/dL (ref 30.0–36.0)
MCV: 93.5 fL (ref 78.0–100.0)
Monocytes Absolute: 0.8 10*3/uL (ref 0.1–1.0)
Monocytes Relative: 10 % (ref 3–12)
NEUTROS PCT: 59 % (ref 43–77)
Neutro Abs: 4.8 10*3/uL (ref 1.7–7.7)
PLATELETS: 187 10*3/uL (ref 150–400)
RBC: 5.07 MIL/uL (ref 4.22–5.81)
RDW: 12.8 % (ref 11.5–15.5)
WBC: 8 10*3/uL (ref 4.0–10.5)

## 2015-06-19 LAB — PROTIME-INR
INR: 2.16 — AB (ref 0.00–1.49)
PROTHROMBIN TIME: 23.9 s — AB (ref 11.6–15.2)

## 2015-06-19 NOTE — ED Notes (Signed)
Pt presents with bleeding coming from bottom lip. Pt is on coumadin. Coumadin level appointment was postponed several weeks. Bleeding began last night.

## 2015-06-19 NOTE — ED Provider Notes (Signed)
CSN: [id number]     Arrival date & time 06/19/15  1455 History   First MD Initiated Contact with Patient 06/19/15 1512     Chief Complaint  Patient presents with  . Mouth Lesions      HPI Pt was seen at 1510. Per pt, c/o sudden onset and persistence of intermittent episodes of "bleeding from my lip" that began yesterday evening. Pt states he was eating fried chicken when he "noticed my upper lip was bleeding." Pt states he was able to stop the bleeding, but it began again during the night. Pt states he got the bleeding to stop again, but it began once again when he tried to eat today. Pt states he was concerned regarding his "coumadin level being too high" because he has had his outpatient appointments postponed several weeks. Denies any other areas of bleeding. No pain, no fevers, no rash.    Past Medical History  Diagnosis Date  . Hypertension   . Esophageal stricture     several times and food impaction once per patient Lady Gary)  . Dizziness   . A-fib   . Anemia     remote, required transfusion. pt has taken chronic iron since  . Shortness of breath   . Sleep apnea     last sleep study > 5 yrs   no cpap  . Chronic kidney disease     freg uti.        . Atrial fibrillation status post cardioversion   . H/O hiatal hernia   . Arthritis    Past Surgical History  Procedure Laterality Date  . Volvulus reduction  [id number]  . Colonoscopy  2009    [facility]-Dr. Laural Golden. three polyps removed. Only two were tubular adenomas/adenomas.  . Foot surgery  2011    right  . Colonoscopy  06/03/2012    Procedure: COLONOSCOPY;  Surgeon: Daneil Dolin, MD;  [facility];  Service: Endoscopy;  Laterality: N/A;  8:30  . Hernia repair    . Anterior cervical decomp/discectomy fusion N/A 02/14/2013    Procedure: ANTERIOR CERVICAL DECOMPRESSION/DISCECTOMY FUSION 2 LEVELS;  Surgeon: Charlie Pitter, MD;  Location: [facility];  Service: Neurosurgery;  Laterality: N/A;   Family History  Problem  Relation Age of Onset  . Heart disease Mother     Pacemaker  . Diabetes Father   . Colon cancer Neg Hx    Social History  Substance Use Topics  . Smoking status: Never Smoker   . Smokeless tobacco: Never Used  . Alcohol Use: No    Review of Systems ROS: Statement: All systems negative except as marked or noted in the HPI; Constitutional: Negative for fever and chills. ; ; Eyes: Negative for eye pain, redness and discharge. ; ; ENMT: Negative for ear pain, hoarseness, nasal congestion, sinus pressure and sore throat. ; ; Cardiovascular: Negative for chest pain, palpitations, diaphoresis, dyspnea and peripheral edema. ; ; Respiratory: Negative for cough, wheezing and stridor. ; ; Gastrointestinal: Negative for nausea, vomiting, diarrhea, abdominal pain, blood in stool, hematemesis, jaundice and rectal bleeding. . ; ; Genitourinary: Negative for dysuria, flank pain and hematuria. ; ; Musculoskeletal: Negative for back pain and neck pain. Negative for swelling and trauma.; ; Skin: +upper lip abrasion. Negative for pruritus, rash, blisters, bruising and skin lesion.; ; Neuro: Negative for headache, lightheadedness and neck stiffness. Negative for weakness, altered level of consciousness , altered mental status, extremity weakness, paresthesias, involuntary movement, seizure and syncope.  Allergies  Review of patient's allergies indicates no known allergies.  Home Medications   Prior to Admission medications   Medication Sig Start Date End Date Taking? Authorizing Provider  atorvastatin (LIPITOR) 10 MG tablet Take 10 mg by mouth daily at 6 PM.  01/22/15  Yes Historical Provider, MD  captopril (CAPOTEN) 50 MG tablet TAKE (1) TABLET BY MOUTH (3) TIMES DAILY. 04/28/15  Yes Evans Lance, MD  fluticasone (FLONASE) 50 MCG/ACT nasal spray Place 1 spray into the nose daily.  05/21/13  Yes Historical Provider, MD  HYDROcodone-acetaminophen (NORCO) 5-325 MG per tablet Take 1 tablet by mouth every 6  (six) hours as needed for pain. 09/10/12  Yes Carole Civil, MD  naproxen (NAPROSYN) 500 MG tablet Take 500 mg by mouth as needed. Pain 03/02/12  Yes Historical Provider, MD  omeprazole (PRILOSEC) 20 MG capsule Take 20 mg by mouth 2 (two) times daily.     Yes Historical Provider, MD  warfarin (COUMADIN) 5 MG tablet Take 1 tablet daily except 1/2 tablet on Tuesdays and Thursdays 05/24/15  Yes Lendon Colonel, NP  cyclobenzaprine (FLEXERIL) 10 MG tablet Take 1 tablet (10 mg total) by mouth 3 (three) times daily as needed for muscle spasms. 02/15/13   Kary Kos, MD  ferrous sulfate 325 (65 FE) MG tablet Take 325 mg by mouth daily with breakfast.      Historical Provider, MD  hydrochlorothiazide (HYDRODIURIL) 25 MG tablet Take 25 mg by mouth daily.  05/08/13   Historical Provider, MD  Tamsulosin HCl (FLOMAX) 0.4 MG CAPS Take 0.4 mg by mouth daily.    Historical Provider, MD   BP 155/82 mmHg  Pulse 96  Temp(Src) 97.4 F (36.3 C) (Axillary)  Resp 18  Ht 6' (1.829 m)  Wt 280 lb (127.007 kg)  BMI 37.97 kg/m2  SpO2 96% Physical Exam  1515: Physical examination:  Nursing notes reviewed; Vital signs and O2 SAT reviewed;  Constitutional: Well developed, Well nourished, Well hydrated, In no acute distress; Head:  Normocephalic, atraumatic; Eyes: EOMI, PERRL, No scleral icterus; ENMT: Mouth and pharynx normal, Mucous membranes moist. No intra-oral or intra-nasal bleeding. +punctate area of oozing to mid-upper lip.; Neck: Supple, Full range of motion, No lymphadenopathy; Cardiovascular: Regular rate and rhythm, No gallop; Respiratory: Breath sounds clear & equal bilaterally, No wheezes.  Speaking full sentences with ease, Normal respiratory effort/excursion; Chest: Nontender, Movement normal; Abdomen: Soft, Nontender, Nondistended, Normal bowel sounds; Genitourinary: No CVA tenderness; Extremities: Pulses normal, No tenderness, No edema, No calf edema or asymmetry.; Neuro: AA&Ox3, Major CN grossly intact.   Speech clear. No gross focal motor or sensory deficits in extremities. Climbs on and off stretcher easily by himself. Gait steady.; Skin: Color normal, Warm, Dry.   ED Course  Procedures (including critical care time) Labs Review   Imaging Review  I have personally reviewed and evaluated these images and lab results as part of my medical decision-making.   EKG Interpretation None      MDM  MDM Reviewed: previous chart, nursing note and vitals Reviewed previous: labs Interpretation: labs      Results for orders placed or performed during the hospital encounter of 06/19/15  CBC with Differential  Result Value Ref Range   WBC 8.0 4.0 - 10.5 K/uL   RBC 5.07 4.22 - 5.81 MIL/uL   Hemoglobin 16.2 13.0 - 17.0 g/dL   HCT 47.4 39.0 - 52.0 %   MCV 93.5 78.0 - 100.0 fL   MCH 32.0 26.0 - 34.0  pg   MCHC 34.2 30.0 - 36.0 g/dL   RDW 12.8 11.5 - 15.5 %   Platelets 187 150 - 400 K/uL   Neutrophils Relative % 59 43 - 77 %   Neutro Abs 4.8 1.7 - 7.7 K/uL   Lymphocytes Relative 21 12 - 46 %   Lymphs Abs 1.7 0.7 - 4.0 K/uL   Monocytes Relative 10 3 - 12 %   Monocytes Absolute 0.8 0.1 - 1.0 K/uL   Eosinophils Relative 9 (H) 0 - 5 %   Eosinophils Absolute 0.7 0.0 - 0.7 K/uL   Basophils Relative 1 0 - 1 %   Basophils Absolute 0.1 0.0 - 0.1 K/uL  Protime-INR  Result Value Ref Range   Prothrombin Time 23.9 (H) 11.6 - 15.2 seconds   INR 2.16 (H) 0.00 - 1.49    1625:  Pt was holding a tissue to his lip on arrival. Firm pressure held to punctate area of bleeding mid-upper lip with hemostasis. No recurrence of bleeding after 1 hour. Has tol PO well without re-bleeding. Labs reassuring, INR therapeutic. Pt states he wants to go home now. Dx and testing d/w pt.  Questions answered.  Verb understanding, agreeable to d/c home with outpt f/u.   Francine Graven, DO 06/22/15 1752

## 2015-06-19 NOTE — Discharge Instructions (Signed)
°Emergency Department Resource Guide °1) Find a Doctor and Pay Out of Pocket °Although you won't have to find out who is covered by your insurance plan, it is a good idea to ask around and get recommendations. You will then need to call the office and see if the doctor you have chosen will accept you as a new patient and what types of options they offer for patients who are self-pay. Some doctors offer discounts or will set up payment plans for their patients who do not have insurance, but you will need to ask so you aren't surprised when you get to your appointment. ° °2) Contact Your Local Health Department °Not all health departments have doctors that can see patients for sick visits, but many do, so it is worth a call to see if yours does. If you don't know where your local health department is, you can check in your phone book. The CDC also has a tool to help you locate your state's health department, and many state websites also have listings of all of their local health departments. ° °3) Find a Walk-in Clinic °If your illness is not likely to be very severe or complicated, you may want to try a walk in clinic. These are popping up all over the country in pharmacies, drugstores, and shopping centers. They're usually staffed by nurse practitioners or physician assistants that have been trained to treat common illnesses and complaints. They're usually fairly quick and inexpensive. However, if you have serious medical issues or chronic medical problems, these are probably not your best option. ° °No Primary Care Doctor: °- Call [facility] Connect at  832-8000 - they can help you locate a primary care doctor that  accepts your insurance, provides certain services, etc. °- Physician Referral Service- 1-800-533-3463 ° °Chronic Pain Problems: °Organization         Address  Phone   Notes  °[facility]  ([phone]) [phone] Patients need to be referred by their primary care doctor.  ° °Medication  Assistance: °Organization         Address  Phone   Notes  °[place] [facility] [address] °[place], [place] [address] (336) 641-8030 --Must be a resident of [place] °-- Must have NO insurance coverage whatsoever (no Medicaid/ Medicare, etc.) °-- The pt. MUST have a primary care doctor that directs their care regularly and follows them in the community °  °[facility]  (866) 331-1348   °[employer]  (888) 892-1162   ° °Agencies that provide inexpensive medical care: °Organization         Address  Phone   Notes  °[facility]  (336) 832-8035   °[facility]    (336) 832-7272   °[facility] [address] °[place], [place] [address] (336) 832-4777   °[facility] [address], °[place] (336) 271-4999   °[facility]    (336) 373-0678   °[facility]    (336) 272-1050   °[facility] ° [address], [place] Phone:  (336) 832-4444, Fax:  (336) 832-4440 Hours of Operation:  9 am - 6 pm, M-F.  Also accepts Medicaid/Medicare and self-pay.  °[facility] ° [address], [place] Phone: (336) [phone], Fax: (336) [phone]. Hours of Operation:  8:30 am - 5:30 pm, M-F.  Also accepts Medicaid and self-pay.  °[facility] [place] [address], [place] Phone: (336) 878-6027   °[facility] [address], [place], [place] (336)723-1848, Ext. 123 Mondays & Thursdays: 7-9 AM.  First 15 patients are seen on a first come, first serve basis. °  ° °Medicaid-accepting [place] Providers: ° °Organization         Address  Phone   Notes  °[facility] [address], [place] (336) 641-2100 Also accepts self-pay patients.  °[facility] [address], [place] ° (336) [phone]   °[facility] [address], [place]  (336) 288-8857   °[facility] [address] [place] [address], [place] (336) 299-7000   °Veita Bland [address], [place]  ° (336) 373-1557 Only accepts [place] Access Medicaid patients after they have their name applied to their card.  ° °Self-Pay (no insurance) in [place]: ° °Organization         Address  Phone   Notes  °Sickle Cell Patients, [facility] [address], [place] (336) 832-1970   °[facility] [address], [place] (336) 832-4400   °[facility] ° [address], [place] (336) 992-4800   °[facility]/Dr. Osei-Bonsu ° [address] [place] [address], [place] or [address], [place] (336) [phone] Phone number for both [place] and [place] locations is the same.  °[facility] [address], [place] (336) 299-0000   °[facility] [address] [place] [address],  [place] or [address] (336) 852-7530 °(336) 878-2260   °[facility] [address], [place] (336) 350-1642, phone; (336) 294-5005, fax Sees patients 1st and 3rd Saturday of every month.  Must not qualify for public or private insurance (i.e. Medicaid, Medicare, [place] Health Choice, Veterans' Benefits) • Household income should be no more than 200% of the poverty level •The clinic cannot treat you if you are pregnant or think you are pregnant • Sexually transmitted diseases are not treated at the clinic.  ° ° °Dental Care: °Organization         Address  Phone  Notes  °[place] [facility] [address],  (336) 641-6152 Accepts children up to age 21 who are enrolled in Medicaid or [place] Health Choice; pregnant women with a Medicaid card; and children who have applied for Medicaid or [place] Health Choice, but were declined, whose parents can pay a reduced fee at time of service.  °[place]  [facility] [place]  [address], [place] (336) 641-7733 Accepts children up to age 21 who are enrolled in Medicaid or [place] Health Choice; pregnant women with a Medicaid card; and children who have applied for Medicaid or [place] Health Choice, but were declined, whose parents can pay a reduced fee at time of service.  °[facility] ° [address],  (336) 641-4533 Patients are seen by appointment only. Walk-ins are not accepted. [facility] will see patients 18 years of age and older. °Monday - Tuesday (8am-5pm) °Most Wednesdays (8:30-5pm) °$30 per visit, cash only  °[facility] ° [address], [place] (336) 641-4533 Patients are seen by appointment only. Walk-ins are not accepted. [facility] will see patients 18 years of age and older. °One   Wednesday Evening (Monthly: Volunteer Based).  $30 per visit, cash only  °[facility]  (919) [phone] for adults; Children under age 4, call Graduate Pediatric Dentistry at ([phone]) [phone]. Children aged 4-14, please call ([phone]) [phone] to request a pediatric application. ° Dental services are provided in all areas of dental care including fillings, crowns and bridges, complete and partial dentures, implants, gum treatment, root canals, and extractions. Preventive care is also provided. Treatment is provided to both adults and children. °Patients are selected via a lottery and there is often a waiting list. °  °[facility] [address], °[place] ° (336) 763-8833 [email or web address] °  °[facility] [address], [place], [place] (336)723-1848, Ext. 123 Second and Fourth Thursday of each month, opens at 6:30 AM; Clinic ends at 9 AM.  Patients are seen on a first-come first-served basis, and a limited number are seen during each clinic.  ° °[facility] ° [address], [place], [place] (336) [phone]    Eligibility Requirements °You must have lived in [place], or [place] counties for at least the last three months. °  You cannot be eligible for state or federal sponsored healthcare insurance, including Veterans Administration, Medicaid, or Medicare. °  You generally cannot be eligible for healthcare insurance through your employer.  °  How to apply: °Eligibility screenings are held every Tuesday and Wednesday afternoon from 1:00 pm until 4:00 pm. You do not need an appointment for the interview!  °[facility] [address], [place], [place] 336-631-2330   °[facility]  336-342-8273   °[facility]  336-703-3100   °[facility]  336-570-6415   ° °Behavioral Health Resources in the Community: °Intensive Outpatient Programs °Organization         Address  Phone  Notes  °[facility] [address], [place], [place] 336-878-6098   °[facility] [address], [place], [place] 336-832-9800   °[facility] [address], [place], [place] ° 336-882-2125   °[facility] [address],  °[place], [place] 1-800-853-5163 or 336-641-4981   °Substance Abuse Resources °Organization         Address  Phone  Notes  °[facility]  336-882-2125   °[facility]  336-784-9470   °[facility]  336-285-9073   °[facility]  336-845-3988   °Residential & Outpatient Substance Abuse Program  1-800-659-3381   °Psychological Services °Organization         Address  Phone  Notes  °[facility]  336- 832-9600   °[facility]  336- 378-7881   °[facility] [address], [place] 1-800-853-5163 or 336-641-4981   ° °Mobile Crisis Teams °Organization         Address  Phone  Notes  °[facility], Mobile Crisis Care Unit  1-877-626-1772   °Assertive °[facility] ° [address]  [place], [place] 336-834-9664   °Sharon DeEsch [address] °[place] 336-554-5454   ° °Self-Help/Support Groups °Organization         Address  Phone             Notes  °[facility] [place] - variety of support groups  336- 373-1402 Call for more information  °Narcotics Anonymous (NA), [facility] [address], °[place]  2 meetings at this location  ° °  Residential Treatment Programs Organization         Address  Phone  Notes  [facility] [address]    [place]  1-972-309-0596   [facility]  [address], [place] [id number], [place], [place]   [place], [place] [phone] Admissions: 8am-3pm M-F  [facility] [place] [address],    [place], [place] 505-697-9480   [facility] [address] [place], [place], [place]   [facility] [address]  [place], [place]   [facility] - Intensive Outpatient [address], [place], [place]   [facility] [address]  [place], [place] 1-979-789-0090 or 431-743-3765   [facility] [address], [place], [place] Accepts Medicaid  [facility] [address]  [place] 1-(765)886-0005 Substance Abuse/Addiction Treatment   [facility]         Address  Phone  Notes  [facility]  802-802-3374   Domenic Schwab, PhD [address] [place], [place]   706-432-0874 or [phone]   [place], [place] ([phone]   [facility] [address] [place], [place] 616-689-5929 Insurance/Medicaid/sponsorship through [facility] [address]                                    [place], [place] [phone] [place] [address], [place] [phone]    Dr. Adele Schilder  [phone]   [facility] Dept. 1) [address] [place] 2) [place] 3)  [address], [place] 226-591-6050 [phone]  ([phone]   [place] 7808081625 or [phone] (After Hours)      Take your usual prescriptions as previously directed. Your INR was 2.16 today. Keep your lips moist with topical water soluble ointment several times per day for the next several days. Call your regular medical doctor on Monday to schedule a follow up appointment within the next 3 days.  Return to the Emergency Department immediately sooner if worsening.

## 2015-06-28 ENCOUNTER — Ambulatory Visit (INDEPENDENT_AMBULATORY_CARE_PROVIDER_SITE_OTHER): Payer: Medicare Other | Admitting: *Deleted

## 2015-06-28 DIAGNOSIS — I4891 Unspecified atrial fibrillation: Secondary | ICD-10-CM

## 2015-06-28 DIAGNOSIS — Z7901 Long term (current) use of anticoagulants: Secondary | ICD-10-CM

## 2015-06-28 DIAGNOSIS — Z5181 Encounter for therapeutic drug level monitoring: Secondary | ICD-10-CM | POA: Diagnosis not present

## 2015-06-28 LAB — POCT INR: INR: 1.4

## 2015-07-19 ENCOUNTER — Ambulatory Visit (INDEPENDENT_AMBULATORY_CARE_PROVIDER_SITE_OTHER): Payer: Medicare Other | Admitting: *Deleted

## 2015-07-19 DIAGNOSIS — Z7901 Long term (current) use of anticoagulants: Secondary | ICD-10-CM | POA: Diagnosis not present

## 2015-07-19 DIAGNOSIS — I4891 Unspecified atrial fibrillation: Secondary | ICD-10-CM

## 2015-07-19 DIAGNOSIS — Z5181 Encounter for therapeutic drug level monitoring: Secondary | ICD-10-CM | POA: Diagnosis not present

## 2015-07-19 LAB — POCT INR: INR: 2.2

## 2015-07-24 ENCOUNTER — Other Ambulatory Visit: Payer: Self-pay | Admitting: Internal Medicine

## 2015-08-30 ENCOUNTER — Ambulatory Visit (INDEPENDENT_AMBULATORY_CARE_PROVIDER_SITE_OTHER): Payer: Medicare Other | Admitting: *Deleted

## 2015-08-30 DIAGNOSIS — Z7901 Long term (current) use of anticoagulants: Secondary | ICD-10-CM | POA: Diagnosis not present

## 2015-08-30 DIAGNOSIS — Z5181 Encounter for therapeutic drug level monitoring: Secondary | ICD-10-CM | POA: Diagnosis not present

## 2015-08-30 DIAGNOSIS — I4891 Unspecified atrial fibrillation: Secondary | ICD-10-CM | POA: Diagnosis not present

## 2015-08-30 LAB — POCT INR: INR: 2.8

## 2015-09-15 ENCOUNTER — Other Ambulatory Visit (HOSPITAL_COMMUNITY): Payer: Self-pay | Admitting: Family Medicine

## 2015-09-15 ENCOUNTER — Ambulatory Visit (HOSPITAL_COMMUNITY)
Admission: RE | Admit: 2015-09-15 | Discharge: 2015-09-15 | Disposition: A | Discharge status: Home / Self Care | Payer: Medicare Other | Source: Ambulatory Visit | Attending: Family Medicine | Admitting: Family Medicine

## 2015-09-15 DIAGNOSIS — R05 Cough: Secondary | ICD-10-CM | POA: Insufficient documentation

## 2015-09-15 DIAGNOSIS — R918 Other nonspecific abnormal finding of lung field: Secondary | ICD-10-CM | POA: Diagnosis not present

## 2015-09-15 DIAGNOSIS — R61 Generalized hyperhidrosis: Secondary | ICD-10-CM | POA: Insufficient documentation

## 2015-09-15 DIAGNOSIS — J302 Other seasonal allergic rhinitis: Secondary | ICD-10-CM

## 2015-09-15 DIAGNOSIS — R6883 Chills (without fever): Secondary | ICD-10-CM | POA: Insufficient documentation

## 2015-09-15 DIAGNOSIS — R059 Cough, unspecified: Secondary | ICD-10-CM

## 2015-10-06 ENCOUNTER — Ambulatory Visit (INDEPENDENT_AMBULATORY_CARE_PROVIDER_SITE_OTHER): Payer: Medicare Other | Admitting: Pharmacist

## 2015-10-06 DIAGNOSIS — Z5181 Encounter for therapeutic drug level monitoring: Secondary | ICD-10-CM

## 2015-10-06 DIAGNOSIS — Z7901 Long term (current) use of anticoagulants: Secondary | ICD-10-CM | POA: Diagnosis not present

## 2015-10-06 DIAGNOSIS — I4891 Unspecified atrial fibrillation: Secondary | ICD-10-CM

## 2015-10-06 LAB — POCT INR: INR: 3.2

## 2015-11-03 ENCOUNTER — Ambulatory Visit (INDEPENDENT_AMBULATORY_CARE_PROVIDER_SITE_OTHER): Payer: Medicare Other | Admitting: Pharmacist

## 2015-11-03 DIAGNOSIS — Z7901 Long term (current) use of anticoagulants: Secondary | ICD-10-CM

## 2015-11-03 DIAGNOSIS — Z5181 Encounter for therapeutic drug level monitoring: Secondary | ICD-10-CM

## 2015-11-03 DIAGNOSIS — I4891 Unspecified atrial fibrillation: Secondary | ICD-10-CM | POA: Diagnosis not present

## 2015-11-03 LAB — POCT INR: INR: 2.4

## 2015-12-08 ENCOUNTER — Ambulatory Visit (INDEPENDENT_AMBULATORY_CARE_PROVIDER_SITE_OTHER): Payer: Medicare Other | Admitting: *Deleted

## 2015-12-08 DIAGNOSIS — Z5181 Encounter for therapeutic drug level monitoring: Secondary | ICD-10-CM | POA: Diagnosis not present

## 2015-12-08 DIAGNOSIS — I4891 Unspecified atrial fibrillation: Secondary | ICD-10-CM

## 2015-12-08 DIAGNOSIS — Z7901 Long term (current) use of anticoagulants: Secondary | ICD-10-CM | POA: Diagnosis not present

## 2015-12-08 LAB — POCT INR: INR: 2.3

## 2015-12-10 ENCOUNTER — Encounter: Payer: Self-pay | Admitting: Internal Medicine

## 2015-12-10 ENCOUNTER — Ambulatory Visit (INDEPENDENT_AMBULATORY_CARE_PROVIDER_SITE_OTHER): Payer: Medicare Other | Admitting: Internal Medicine

## 2015-12-10 VITALS — BP 160/90 | HR 75 | Ht 74.0 in | Wt 285.0 lb

## 2015-12-10 DIAGNOSIS — R0602 Shortness of breath: Secondary | ICD-10-CM | POA: Diagnosis not present

## 2015-12-10 DIAGNOSIS — I4891 Unspecified atrial fibrillation: Secondary | ICD-10-CM

## 2015-12-10 MED ORDER — FUROSEMIDE 40 MG PO TABS: 40.0000 mg | ORAL_TABLET | Freq: Every day | ORAL | Status: DC

## 2015-12-10 MED ORDER — CARVEDILOL 3.125 MG PO TABS: 3.1250 mg | ORAL_TABLET | Freq: Two times a day (BID) | ORAL | Status: DC

## 2015-12-10 NOTE — Patient Instructions (Signed)
Your physician recommends that you schedule a follow-up appointment in: 3 Months with Dr. Lovena Le  Your physician has requested that you have an echocardiogram. Echocardiography is a painless test that uses sound waves to create images of your heart. It provides your doctor with information about the size and shape of your heart and how well your heart's chambers and valves are working. This procedure takes approximately one hour. There are no restrictions for this procedure.  Your physician recommends that you return for lab work in: On the day of your Echo  Your physician has recommended you make the following change in your medication:   Stop Taking HCTZ  Start Taking Lasix 40 mg Daily   Start Taking Coreg 3.125 Two Times Daily   If you need a refill on your cardiac medications before your next appointment, please call your pharmacy.  Thank you for choosing [facility]!

## 2015-12-10 NOTE — Progress Notes (Signed)
HPI Brent Mason returns today for followup. He is a pleasant 70 yo man with chronic atrial fib, HTN, obesity, and sleep apnea. In the interim, he has been stable although he is worried about his wife who has had many medical problems. In the interim, he has noted increasing sob, wheezing and peripheral edema. Because his wife has been sick and he is caring for her, he admits to some dietary indiscretion.  No Known Allergies   Current Outpatient Prescriptions  Medication Sig Dispense Refill  . atorvastatin (LIPITOR) 10 MG tablet Take 10 mg by mouth daily at 6 PM.   10  . cyclobenzaprine (FLEXERIL) 10 MG tablet Take 1 tablet (10 mg total) by mouth 3 (three) times daily as needed for muscle spasms. 80 tablet 1  . ferrous sulfate 325 (65 FE) MG tablet Take 325 mg by mouth daily with breakfast.      . fluticasone (FLONASE) 50 MCG/ACT nasal spray Place 1 spray into the nose daily.     . hydrochlorothiazide (HYDRODIURIL) 25 MG tablet Take 25 mg by mouth daily.     Marland Kitchen HYDROcodone-acetaminophen (NORCO) 5-325 MG per tablet Take 1 tablet by mouth every 6 (six) hours as needed for pain. 56 tablet 2  . losartan (COZAAR) 100 MG tablet Take 100 mg by mouth daily.    . naproxen (NAPROSYN) 500 MG tablet Take 500 mg by mouth as needed. Pain    . omeprazole (PRILOSEC) 20 MG capsule Take 20 mg by mouth 2 (two) times daily.      Marland Kitchen warfarin (COUMADIN) 5 MG tablet Take 1 tablet daily except 1/2 tablet on Tuesdays and Thursdays 30 tablet 6   No current facility-administered medications for this visit.     Past Medical History  Diagnosis Date  . Hypertension   . Esophageal stricture     several times and food impaction once per patient Brent Mason)  . Dizziness   . A-fib ([place])   . Anemia     remote, required transfusion. pt has taken chronic iron since  . Shortness of breath   . Sleep apnea     last sleep study > 5 yrs   no cpap  . Chronic kidney disease     freg uti.        . Atrial  fibrillation status post cardioversion ([place])   . H/O hiatal hernia   . Arthritis     ROS:   All systems reviewed and negative except as noted in the HPI.   Past Surgical History  Procedure Laterality Date  . Volvulus reduction  [id number]  . Colonoscopy  2009    [facility]-Dr. Laural Golden. three polyps removed. Only two were tubular adenomas/adenomas.  . Foot surgery  2011    right  . Colonoscopy  06/03/2012    Procedure: COLONOSCOPY;  Surgeon: Daneil Dolin, MD;  [facility];  Service: Endoscopy;  Laterality: N/A;  8:30  . Hernia repair    . Anterior cervical decomp/discectomy fusion N/A 02/14/2013    Procedure: ANTERIOR CERVICAL DECOMPRESSION/DISCECTOMY FUSION 2 LEVELS;  Surgeon: Charlie Pitter, MD;  Location: [facility];  Service: Neurosurgery;  Laterality: N/A;     Family History  Problem Relation Age of Onset  . Heart disease Mother     Pacemaker  . Diabetes Father   . Colon cancer Neg Hx      Social History   Social History  . Marital Status: Married    Spouse Name: N/A  .  Number of Children: N/A  . Years of Education: N/A   Occupational History  . LABORER     full time   Social History Main Topics  . Smoking status: Never Smoker   . Smokeless tobacco: Never Used  . Alcohol Use: No  . Drug Use: No  . Sexual Activity: Not on file   Other Topics Concern  . Not on file   Social History Narrative   No regular exercise     BP 160/90 mmHg  Pulse 75  Ht 6\' 2"  (1.88 m)  Wt 285 lb (129.275 kg)  BMI 36.58 kg/m2  SpO2 95%  Physical Exam:  obese appearing 70 yo man, NAD HEENT: Unremarkable Neck:  7-8 cm JVD, no thyromegally Lymphatics:  No adenopathy Back:  No CVA tenderness Lungs:  Clear with no wheezes HEART:  IRegular rate rhythm, no murmurs, no rubs, no clicks Abd:  soft, positive bowel sounds, no organomegally, no rebound, no guarding Ext:  2 plus pulses, 1+ peripheral edema, no cyanosis, no clubbing Skin:  No rashes no nodules Neuro:  CN II  through XII intact, motor grossly intact  ECG - atrial fib with a CVR and PVC's  Assess/Plan: 1. Worsening dyspnea, likely due to acute on chronic diastolic heart failure - I have asked him to stop HCTZ, start lasix and coreg, and check a 2D echo and a BMP in the next week or two. 2. Atrial fib - his rate appears to be well controlled. Will check in several months 3. HTN - his blood pressure is up today and we have adjusted his meds. 4. Obesity - I have asked the patient to lose weight.  Brent Mason.D.

## 2015-12-14 ENCOUNTER — Ambulatory Visit (HOSPITAL_COMMUNITY)
Admission: RE | Admit: 2015-12-14 | Discharge: 2015-12-14 | Disposition: A | Discharge status: Home / Self Care | Payer: Medicare Other | Source: Ambulatory Visit | Attending: Internal Medicine | Admitting: Internal Medicine

## 2015-12-14 DIAGNOSIS — E785 Hyperlipidemia, unspecified: Secondary | ICD-10-CM | POA: Insufficient documentation

## 2015-12-14 DIAGNOSIS — I350 Nonrheumatic aortic (valve) stenosis: Secondary | ICD-10-CM | POA: Insufficient documentation

## 2015-12-14 DIAGNOSIS — I4891 Unspecified atrial fibrillation: Secondary | ICD-10-CM | POA: Insufficient documentation

## 2015-12-14 DIAGNOSIS — K219 Gastro-esophageal reflux disease without esophagitis: Secondary | ICD-10-CM | POA: Diagnosis not present

## 2015-12-14 DIAGNOSIS — I700 Atherosclerosis of aorta: Secondary | ICD-10-CM | POA: Insufficient documentation

## 2015-12-14 DIAGNOSIS — I10 Essential (primary) hypertension: Secondary | ICD-10-CM | POA: Diagnosis not present

## 2015-12-14 DIAGNOSIS — R0602 Shortness of breath: Secondary | ICD-10-CM

## 2015-12-14 MED ORDER — PERFLUTREN LIPID MICROSPHERE
1.0000 mL | INTRAVENOUS | Status: AC | PRN
  Administered 2015-12-14: 2 mL via INTRAVENOUS
  Administered 2015-12-14: 1 mL via INTRAVENOUS

## 2015-12-14 NOTE — Progress Notes (Signed)
Iv 22g to right lateral fa discontinued with cath intact. Patient tolerated well. NAD

## 2015-12-14 NOTE — Progress Notes (Signed)
Iv 22g 1st attempt to right lateral FA. Patient tolerated well. Blood return and flushed well.  IV for ECHO definity

## 2015-12-15 ENCOUNTER — Telehealth: Payer: Self-pay | Admitting: *Deleted

## 2015-12-15 LAB — BRAIN NATRIURETIC PEPTIDE: BRAIN NATRIURETIC PEPTIDE: 65.5 pg/mL (ref <100)

## 2015-12-15 NOTE — Telephone Encounter (Signed)
Notes Recorded by Evans Lance, MD on 12/15/2015 at 9:01 AM His pumping function is normal. Heart a little stiff. Eat less salt and lose weight. If still short of breath will need to consider more lasix.  Patient notified of Echo results. Pt voiced understanding.

## 2016-01-19 ENCOUNTER — Ambulatory Visit (INDEPENDENT_AMBULATORY_CARE_PROVIDER_SITE_OTHER): Payer: Medicare Other | Admitting: *Deleted

## 2016-01-19 DIAGNOSIS — Z7901 Long term (current) use of anticoagulants: Secondary | ICD-10-CM | POA: Diagnosis not present

## 2016-01-19 DIAGNOSIS — I4891 Unspecified atrial fibrillation: Secondary | ICD-10-CM | POA: Diagnosis not present

## 2016-01-19 DIAGNOSIS — Z5181 Encounter for therapeutic drug level monitoring: Secondary | ICD-10-CM | POA: Diagnosis not present

## 2016-01-19 LAB — POCT INR: INR: 2

## 2016-01-21 DIAGNOSIS — J019 Acute sinusitis, unspecified: Secondary | ICD-10-CM | POA: Diagnosis not present

## 2016-01-21 DIAGNOSIS — N400 Enlarged prostate without lower urinary tract symptoms: Secondary | ICD-10-CM | POA: Diagnosis not present

## 2016-01-21 DIAGNOSIS — J9801 Acute bronchospasm: Secondary | ICD-10-CM | POA: Diagnosis not present

## 2016-01-21 DIAGNOSIS — Z6836 Body mass index (BMI) 36.0-36.9, adult: Secondary | ICD-10-CM | POA: Diagnosis not present

## 2016-01-21 DIAGNOSIS — Z1389 Encounter for screening for other disorder: Secondary | ICD-10-CM | POA: Diagnosis not present

## 2016-01-21 DIAGNOSIS — I4891 Unspecified atrial fibrillation: Secondary | ICD-10-CM | POA: Diagnosis not present

## 2016-01-21 DIAGNOSIS — I7389 Other specified peripheral vascular diseases: Secondary | ICD-10-CM | POA: Diagnosis not present

## 2016-02-09 DIAGNOSIS — Z0001 Encounter for general adult medical examination with abnormal findings: Secondary | ICD-10-CM | POA: Diagnosis not present

## 2016-02-09 DIAGNOSIS — Z1389 Encounter for screening for other disorder: Secondary | ICD-10-CM | POA: Diagnosis not present

## 2016-02-09 DIAGNOSIS — I10 Essential (primary) hypertension: Secondary | ICD-10-CM | POA: Diagnosis not present

## 2016-02-09 DIAGNOSIS — Z6836 Body mass index (BMI) 36.0-36.9, adult: Secondary | ICD-10-CM | POA: Diagnosis not present

## 2016-02-09 DIAGNOSIS — F419 Anxiety disorder, unspecified: Secondary | ICD-10-CM | POA: Diagnosis not present

## 2016-02-09 DIAGNOSIS — G894 Chronic pain syndrome: Secondary | ICD-10-CM | POA: Diagnosis not present

## 2016-03-01 ENCOUNTER — Ambulatory Visit (INDEPENDENT_AMBULATORY_CARE_PROVIDER_SITE_OTHER): Payer: Medicare Other | Admitting: *Deleted

## 2016-03-01 DIAGNOSIS — Z7901 Long term (current) use of anticoagulants: Secondary | ICD-10-CM

## 2016-03-01 DIAGNOSIS — Z5181 Encounter for therapeutic drug level monitoring: Secondary | ICD-10-CM

## 2016-03-01 DIAGNOSIS — I4891 Unspecified atrial fibrillation: Secondary | ICD-10-CM | POA: Diagnosis not present

## 2016-03-01 LAB — POCT INR: INR: 2.5

## 2016-03-09 ENCOUNTER — Other Ambulatory Visit: Payer: Self-pay | Admitting: Adult Health

## 2016-03-10 ENCOUNTER — Ambulatory Visit (INDEPENDENT_AMBULATORY_CARE_PROVIDER_SITE_OTHER): Payer: Medicare Other | Admitting: Internal Medicine

## 2016-03-10 ENCOUNTER — Encounter: Payer: Self-pay | Admitting: Internal Medicine

## 2016-03-10 VITALS — BP 108/62 | HR 52 | Ht 74.0 in | Wt 276.0 lb

## 2016-03-10 DIAGNOSIS — I5022 Chronic systolic (congestive) heart failure: Secondary | ICD-10-CM | POA: Diagnosis not present

## 2016-03-10 NOTE — Progress Notes (Signed)
HPI Mr. Hochstein returns today for followup. He is a pleasant 70 yo man with chronic atrial fib, HTN, obesity, and sleep apnea. I saw him 3 months ago and he was more sob. His echo demonstrated preserved LV function and I had him start lasix and coreg. His weight is down 9 lbs and he feels better.   No Known Allergies   Current Outpatient Prescriptions  Medication Sig Dispense Refill  . atorvastatin (LIPITOR) 10 MG tablet Take 10 mg by mouth daily at 6 PM.   10  . carvedilol (COREG) 3.125 MG tablet Take 1 tablet (3.125 mg total) by mouth 2 (two) times daily. 180 tablet 3  . cyclobenzaprine (FLEXERIL) 10 MG tablet Take 1 tablet (10 mg total) by mouth 3 (three) times daily as needed for muscle spasms. 80 tablet 1  . ferrous sulfate 325 (65 FE) MG tablet Take 325 mg by mouth daily with breakfast.      . fluticasone (FLONASE) 50 MCG/ACT nasal spray Place 1 spray into the nose daily.     . furosemide (LASIX) 40 MG tablet Take 1 tablet (40 mg total) by mouth daily. 90 tablet 3  . HYDROcodone-acetaminophen (NORCO) 5-325 MG per tablet Take 1 tablet by mouth every 6 (six) hours as needed for pain. 56 tablet 2  . losartan (COZAAR) 100 MG tablet Take 100 mg by mouth daily.    . naproxen (NAPROSYN) 500 MG tablet Take 500 mg by mouth as needed. Pain    . omeprazole (PRILOSEC) 20 MG capsule Take 20 mg by mouth 2 (two) times daily.      Marland Kitchen warfarin (COUMADIN) 5 MG tablet TAKE ONE TABLET BY MOUTH ONCE DAILY, EXCEPT TUESDAYS AND THURSDAYS TAKE 1/2 TABLET. 30 tablet 4   No current facility-administered medications for this visit.     Past Medical History  Diagnosis Date  . Hypertension   . Esophageal stricture     several times and food impaction once per patient Lady Gary)  . Dizziness   . A-fib ([place])   . Anemia     remote, required transfusion. pt has taken chronic iron since  . Shortness of breath   . Sleep apnea     last sleep study > 5 yrs   no cpap  . Chronic kidney disease    freg uti.        . Atrial fibrillation status post cardioversion ([place])   . H/O hiatal hernia   . Arthritis     ROS:   All systems reviewed and negative except as noted in the HPI.   Past Surgical History  Procedure Laterality Date  . Volvulus reduction  [id number]  . Colonoscopy  2009    [facility]-Dr. La[facility] Golden. three polyps removed. Only two were tubular adenomas/adenomas.  . Foot surgery  2011    right  . Colonoscopy  06/03/2012    Procedure: COLONOSCOPY;  Surgeon: Daneil Dolin, MD;  [facility];  Service: Endoscopy;  Laterality: N/A;  8:30  . Hernia repair    . Anterior cervical decomp/discectomy fusion N/A 02/14/2013    Procedure: ANTERIOR CERVICAL DECOMPRESSION/DISCECTOMY FUSION 2 LEVELS;  Surgeon: Charlie Pitter, MD;  Location: [facility];  Service: Neurosurgery;  Laterality: N/A;     Family History  Problem Relation Age of Onset  . Heart disease Mother     Pacemaker  . Diabetes Father   . Colon cancer Neg Hx      Social History   Social  History  . Marital Status: Married    Spouse Name: N/A  . Number of Children: N/A  . Years of Education: N/A   Occupational History  . LABORER     full time   Social History Main Topics  . Smoking status: Never Smoker   . Smokeless tobacco: Never Used  . Alcohol Use: No  . Drug Use: No  . Sexual Activity: Not on file   Other Topics Concern  . Not on file   Social History Narrative   No regular exercise     BP 108/62 mmHg  Pulse 52  Ht 6\' 2"  (1.88 m)  Wt 276 lb (125.193 kg)  BMI 35.42 kg/m2  SpO2 95%  Physical Exam:  obese appearing 70 yo man, NAD HEENT: Unremarkable Neck:  7-8 cm JVD, no thyromegally Lymphatics:  No adenopathy Back:  No CVA tenderness Lungs:  Clear with no wheezes HEART:  IRegular rate rhythm, no murmurs, no rubs, no clicks Abd:  soft, positive bowel sounds, no organomegally, no rebound, no guarding Ext:  2 plus pulses, 1+ peripheral edema, no cyanosis, no clubbing Skin:  No  rashes no nodules Neuro:  CN II through XII intact, motor grossly intact  ECG - atrial fib with a CVR and PVC's  Assess/Plan: 1. chronic diastolic heart failure - He has responded to lasix. I have encouraged him to lose more weight. I will see him back in a year. 2. Atrial fib - his rate appears to be well controlled. Will check in several months 3. HTN - his blood pressure is better on coreg. Will continue a low sodium diet. 4. Obesity - I have asked the patient to lose weight.  Naithen Rivenburg,M.D. Mikle Bosworth.D.

## 2016-03-10 NOTE — Patient Instructions (Signed)
Your physician wants you to follow-up in: 1 Year with Dr. Taylor. You will receive a reminder letter in the mail two months in advance. If you don't receive a letter, please call our office to schedule the follow-up appointment.  Your physician recommends that you continue on your current medications as directed. Please refer to the Current Medication list given to you today.  If you need a refill on your cardiac medications before your next appointment, please call your pharmacy.  Thank you for choosing [facility]!    

## 2016-04-12 ENCOUNTER — Ambulatory Visit (INDEPENDENT_AMBULATORY_CARE_PROVIDER_SITE_OTHER): Payer: Medicare Other | Admitting: *Deleted

## 2016-04-12 DIAGNOSIS — Z7901 Long term (current) use of anticoagulants: Secondary | ICD-10-CM

## 2016-04-12 DIAGNOSIS — I4891 Unspecified atrial fibrillation: Secondary | ICD-10-CM

## 2016-04-12 DIAGNOSIS — Z5181 Encounter for therapeutic drug level monitoring: Secondary | ICD-10-CM | POA: Diagnosis not present

## 2016-04-12 LAB — POCT INR: INR: 2.4

## 2016-05-24 ENCOUNTER — Ambulatory Visit (INDEPENDENT_AMBULATORY_CARE_PROVIDER_SITE_OTHER): Payer: Medicare Other | Admitting: *Deleted

## 2016-05-24 DIAGNOSIS — Z5181 Encounter for therapeutic drug level monitoring: Secondary | ICD-10-CM | POA: Diagnosis not present

## 2016-05-24 DIAGNOSIS — Z7901 Long term (current) use of anticoagulants: Secondary | ICD-10-CM

## 2016-05-24 DIAGNOSIS — I4891 Unspecified atrial fibrillation: Secondary | ICD-10-CM | POA: Diagnosis not present

## 2016-05-24 LAB — POCT INR: INR: 2.9

## 2016-06-15 DIAGNOSIS — I10 Essential (primary) hypertension: Secondary | ICD-10-CM | POA: Diagnosis not present

## 2016-06-15 DIAGNOSIS — I7389 Other specified peripheral vascular diseases: Secondary | ICD-10-CM | POA: Diagnosis not present

## 2016-06-15 DIAGNOSIS — N400 Enlarged prostate without lower urinary tract symptoms: Secondary | ICD-10-CM | POA: Diagnosis not present

## 2016-06-15 DIAGNOSIS — G894 Chronic pain syndrome: Secondary | ICD-10-CM | POA: Diagnosis not present

## 2016-06-15 DIAGNOSIS — I4891 Unspecified atrial fibrillation: Secondary | ICD-10-CM | POA: Diagnosis not present

## 2016-06-15 DIAGNOSIS — Z1389 Encounter for screening for other disorder: Secondary | ICD-10-CM | POA: Diagnosis not present

## 2016-06-15 DIAGNOSIS — Z6837 Body mass index (BMI) 37.0-37.9, adult: Secondary | ICD-10-CM | POA: Diagnosis not present

## 2016-07-05 ENCOUNTER — Ambulatory Visit (INDEPENDENT_AMBULATORY_CARE_PROVIDER_SITE_OTHER): Payer: Medicare Other | Admitting: *Deleted

## 2016-07-05 DIAGNOSIS — Z5181 Encounter for therapeutic drug level monitoring: Secondary | ICD-10-CM | POA: Diagnosis not present

## 2016-07-05 DIAGNOSIS — Z7901 Long term (current) use of anticoagulants: Secondary | ICD-10-CM | POA: Diagnosis not present

## 2016-07-05 DIAGNOSIS — I4891 Unspecified atrial fibrillation: Secondary | ICD-10-CM | POA: Diagnosis not present

## 2016-07-05 LAB — POCT INR: INR: 2.7

## 2016-07-20 DIAGNOSIS — C44622 Squamous cell carcinoma of skin of right upper limb, including shoulder: Secondary | ICD-10-CM | POA: Diagnosis not present

## 2016-07-20 DIAGNOSIS — L570 Actinic keratosis: Secondary | ICD-10-CM | POA: Diagnosis not present

## 2016-07-20 DIAGNOSIS — X32XXXA Exposure to sunlight, initial encounter: Secondary | ICD-10-CM | POA: Diagnosis not present

## 2016-07-20 DIAGNOSIS — L918 Other hypertrophic disorders of the skin: Secondary | ICD-10-CM | POA: Diagnosis not present

## 2016-07-27 ENCOUNTER — Other Ambulatory Visit: Payer: Self-pay | Admitting: Adult Health

## 2016-08-16 ENCOUNTER — Ambulatory Visit (INDEPENDENT_AMBULATORY_CARE_PROVIDER_SITE_OTHER): Payer: Medicare Other | Admitting: *Deleted

## 2016-08-16 DIAGNOSIS — Z5181 Encounter for therapeutic drug level monitoring: Secondary | ICD-10-CM | POA: Diagnosis not present

## 2016-08-16 DIAGNOSIS — I4891 Unspecified atrial fibrillation: Secondary | ICD-10-CM | POA: Diagnosis not present

## 2016-08-16 DIAGNOSIS — Z7901 Long term (current) use of anticoagulants: Secondary | ICD-10-CM

## 2016-08-16 LAB — POCT INR: INR: 2.8

## 2016-09-01 ENCOUNTER — Other Ambulatory Visit: Payer: Self-pay | Admitting: Internal Medicine

## 2016-09-01 DIAGNOSIS — R0602 Shortness of breath: Secondary | ICD-10-CM

## 2016-09-27 ENCOUNTER — Ambulatory Visit (INDEPENDENT_AMBULATORY_CARE_PROVIDER_SITE_OTHER): Payer: Medicare Other | Admitting: *Deleted

## 2016-09-27 DIAGNOSIS — Z5181 Encounter for therapeutic drug level monitoring: Secondary | ICD-10-CM

## 2016-09-27 DIAGNOSIS — I4891 Unspecified atrial fibrillation: Secondary | ICD-10-CM | POA: Diagnosis not present

## 2016-09-27 DIAGNOSIS — Z7901 Long term (current) use of anticoagulants: Secondary | ICD-10-CM | POA: Diagnosis not present

## 2016-09-27 LAB — POCT INR: INR: 1.5

## 2016-10-13 DIAGNOSIS — M170 Bilateral primary osteoarthritis of knee: Secondary | ICD-10-CM | POA: Diagnosis not present

## 2016-10-16 HISTORY — PX: OTHER SURGICAL HISTORY: SHX169

## 2016-10-21 ENCOUNTER — Other Ambulatory Visit: Payer: Self-pay | Admitting: Internal Medicine

## 2016-10-21 DIAGNOSIS — R0602 Shortness of breath: Secondary | ICD-10-CM

## 2016-11-08 ENCOUNTER — Ambulatory Visit (INDEPENDENT_AMBULATORY_CARE_PROVIDER_SITE_OTHER): Payer: Medicare Other | Admitting: *Deleted

## 2016-11-08 DIAGNOSIS — Z7901 Long term (current) use of anticoagulants: Secondary | ICD-10-CM | POA: Diagnosis not present

## 2016-11-08 DIAGNOSIS — I4891 Unspecified atrial fibrillation: Secondary | ICD-10-CM | POA: Diagnosis not present

## 2016-11-08 DIAGNOSIS — Z5181 Encounter for therapeutic drug level monitoring: Secondary | ICD-10-CM | POA: Diagnosis not present

## 2016-11-08 LAB — POCT INR: INR: 2.3

## 2016-12-05 ENCOUNTER — Encounter (INDEPENDENT_AMBULATORY_CARE_PROVIDER_SITE_OTHER): Payer: Self-pay | Admitting: Orthopedic Surgery

## 2016-12-05 ENCOUNTER — Ambulatory Visit (INDEPENDENT_AMBULATORY_CARE_PROVIDER_SITE_OTHER): Payer: Medicare Other | Admitting: Orthopedic Surgery

## 2016-12-05 ENCOUNTER — Ambulatory Visit (INDEPENDENT_AMBULATORY_CARE_PROVIDER_SITE_OTHER): Payer: Self-pay

## 2016-12-05 DIAGNOSIS — M79671 Pain in right foot: Secondary | ICD-10-CM | POA: Diagnosis not present

## 2016-12-05 DIAGNOSIS — T85848S Pain due to other internal prosthetic devices, implants and grafts, sequela: Secondary | ICD-10-CM

## 2016-12-05 DIAGNOSIS — Z9889 Other specified postprocedural states: Secondary | ICD-10-CM

## 2016-12-05 DIAGNOSIS — T85848A Pain due to other internal prosthetic devices, implants and grafts, initial encounter: Secondary | ICD-10-CM | POA: Insufficient documentation

## 2016-12-05 NOTE — Progress Notes (Signed)
Office Visit Note   Patient: Brent Mason           Date of Birth: 1946/03/16           MRN: [id number] Visit Date: 12/05/2016 Requested by: Redmond School, MD [address] [place], [place] [address] PCP: Glo Herring., MD  Subjective: Chief Complaint  Patient presents with  . Right Foot - Pain    Patient is complaining of right foot pain.  He has a history of surgery in 2011, right foot arthrodesis talonavicular subtalar joint.  There is a protrusion on medial foot, at arch.  It is painful for him to walk or stand.  He takes hydrocodone BID for pain.                 Review of Systems   Assessment & Plan: Visit Diagnoses:  1. S/P foot surgery, right   2. Pain in right foot   3. Pain from implanted hardware, sequela     Plan: We'll plan for removal of the deep retained hardware. Patient has impending skin breakdown and is at risk for ulceration and potential infection of the bone. Risk and benefits of surgery were discussed patient states he understands wishes to proceed. Patient does have shortness of breath seated we will plan for a local block.  Follow-Up Instructions: Return in about 3 weeks (around 12/26/2016).   Orders:  Orders Placed This Encounter  Procedures  . XR Foot Complete Right   No orders of the defined types were placed in this encounter.     Procedures: No procedures performed   Clinical Data: No additional findings.  Objective: Vital Signs: There were no vitals taken for this visit.  Physical Exam on examination patient is alert oriented no adenopathy well-dressed normal affect normal respiratory effort he is short of breath while seated. He has an antalgic gait. Examination the right foot there is no redness no synovitis he has pending skin breakdown from a prominent screw head inferiorly. He has palpable pulses.  Ortho Exam  Specialty Comments:  No specialty comments available.  Imaging: Xr Foot Complete Right  Result  Date: 12/05/2016 Three-view radiographs of the right foot shows stable subtalar fusion. Patient has had rocker-bottom deformity through the talonavicular fusion with prominent screw head inferiorly.    PMFS History: Patient Active Problem List   Diagnosis Date Noted  . S/P foot surgery, right 12/05/2016  . Pain in right foot 12/05/2016  . Pain from implanted hardware 12/05/2016  . Bilateral carotid bruits 07/08/2014  . Encounter for therapeutic drug monitoring 11/13/2013  . Claudication of left lower extremity ([place]) 12/02/2012  . Neck pain 09/16/2012  . Cervical spondylosis without myelopathy 09/10/2012  . Hx of adenomatous colonic polyps 05/07/2012  . Rectal bleeding 05/07/2012  . GERD (gastroesophageal reflux disease) 05/07/2012  . Long term current use of anticoagulant 01/25/2011  . HYPERLIPIDEMIA 11/21/2010  . DYSPNEA 10/20/2010  . MORBID OBESITY 09/01/2009  . Atrial fibrillation ([place]) 09/01/2009  . Essential hypertension 06/14/2009  . ESOPHAGEAL STRICTURE 06/14/2009  . DIZZINESS 06/14/2009   Past Medical History:  Diagnosis Date  . A-fib ([place])   . Anemia    remote, required transfusion. pt has taken chronic iron since  . Arthritis   . Atrial fibrillation status post cardioversion ([place])   . Chronic kidney disease    freg uti.        . Dizziness   . Esophageal stricture    several times and food impaction once per patient (  )  . H/O hiatal hernia   . Hypertension   . Shortness of breath   . Sleep apnea    last sleep study > 5 yrs   no cpap    Family History  Problem Relation Age of Onset  . Heart disease Mother     Pacemaker  . Diabetes Father   . Colon cancer Neg Hx     Past Surgical History:  Procedure Laterality Date  . ANTERIOR CERVICAL DECOMP/DISCECTOMY FUSION N/A 02/14/2013   Procedure: ANTERIOR CERVICAL DECOMPRESSION/DISCECTOMY FUSION 2 LEVELS;  Surgeon: Charlie Pitter, MD;  Location: [facility];  Service: Neurosurgery;  Laterality: N/A;  .  COLONOSCOPY  2009   [facility]-Dr. La[facility] Golden. three polyps removed. Only two were tubular adenomas/adenomas.  . COLONOSCOPY  06/03/2012   Procedure: COLONOSCOPY;  Surgeon: Daneil Dolin, MD;  [facility];  Service: Endoscopy;  Laterality: N/A;  8:30  . FOOT SURGERY  2011   right  . HERNIA REPAIR    . VOLVULUS REDUCTION  [id number]   Social History   Occupational History  . 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 Smoking status: Never Smoker  . Smokeless tobacco: Never Used  . Alcohol use No  . Drug use: No  . Sexual activity: Not on file

## 2016-12-18 ENCOUNTER — Telehealth: Payer: Self-pay | Admitting: Internal Medicine

## 2016-12-18 NOTE — Telephone Encounter (Signed)
Called, spoke with pt. Pt stated he needs clearance for upcoming foot surgery. Informed to keep appt with Dr. Lovena Le at the [facility] office on 12/21/16.  Informed we have not received the clearance yet (at this office). Informed once received, will forward to [facility] to advise on holding Coumadin. Pt verbalized understanding.

## 2016-12-18 NOTE — Telephone Encounter (Signed)
New message   Pt daughter is calling for a surgical clearance appt, I informed her that the physicians office has to call for clearance  She wants rn to call her pt having sob, she is not with pt but she stated she can hear it over the phone and wants to know what medications need to be stopped for surgery

## 2016-12-20 ENCOUNTER — Ambulatory Visit (INDEPENDENT_AMBULATORY_CARE_PROVIDER_SITE_OTHER): Payer: Medicare Other | Admitting: *Deleted

## 2016-12-20 DIAGNOSIS — Z5181 Encounter for therapeutic drug level monitoring: Secondary | ICD-10-CM | POA: Diagnosis not present

## 2016-12-20 DIAGNOSIS — Z7901 Long term (current) use of anticoagulants: Secondary | ICD-10-CM

## 2016-12-20 DIAGNOSIS — I4891 Unspecified atrial fibrillation: Secondary | ICD-10-CM

## 2016-12-20 LAB — POCT INR: INR: 3.5

## 2016-12-21 ENCOUNTER — Telehealth: Payer: Self-pay | Admitting: *Deleted

## 2016-12-21 ENCOUNTER — Ambulatory Visit (INDEPENDENT_AMBULATORY_CARE_PROVIDER_SITE_OTHER): Payer: Medicare Other | Admitting: Internal Medicine

## 2016-12-21 ENCOUNTER — Encounter: Payer: Self-pay | Admitting: Internal Medicine

## 2016-12-21 VITALS — BP 140/84 | HR 71 | Ht 74.0 in | Wt 292.0 lb

## 2016-12-21 DIAGNOSIS — I4891 Unspecified atrial fibrillation: Secondary | ICD-10-CM

## 2016-12-21 NOTE — Patient Instructions (Signed)
Your physician wants you to follow-up in: 6 Months with Dr. Taylor. You will receive a reminder letter in the mail two months in advance. If you don't receive a letter, please call our office to schedule the follow-up appointment.  Your physician recommends that you continue on your current medications as directed. Please refer to the Current Medication list given to you today.  If you need a refill on your cardiac medications before your next appointment, please call your pharmacy.  Thank you for choosing [facility]!   

## 2016-12-21 NOTE — Progress Notes (Signed)
HPI Mr. Geibel returns today for followup. He is a pleasant 71 yo man with chronic atrial fib, HTN, obesity, and sleep apnea. I saw him 9 months ago and he was more sob. His echo demonstrated preserved LV function and I had him start lasix and coreg and he improved. His weight is back up 16 lbs since May. He is pending surgery on his foot. He has been more sedentary because it hurts his foot for him to walk.  No Known Allergies   Current Outpatient Prescriptions  Medication Sig Dispense Refill  . atorvastatin (LIPITOR) 10 MG tablet Take 10 mg by mouth daily at 6 PM.   10  . carvedilol (COREG) 3.125 MG tablet TAKE (1) TABLET BY MOUTH TWICE DAILY. 180 tablet 3  . cyclobenzaprine (FLEXERIL) 10 MG tablet Take 1 tablet (10 mg total) by mouth 3 (three) times daily as needed for muscle spasms. 80 tablet 1  . ferrous sulfate 325 (65 FE) MG tablet Take 325 mg by mouth daily with breakfast.      . fluticasone (FLONASE) 50 MCG/ACT nasal spray Place 1 spray into the nose daily.     . furosemide (LASIX) 40 MG tablet TAKE ONE TABLET BY MOUTH ONCE DAILY. 90 tablet 3  . HYDROcodone-acetaminophen (NORCO) 5-325 MG per tablet Take 1 tablet by mouth every 6 (six) hours as needed for pain. 56 tablet 2  . losartan (COZAAR) 100 MG tablet Take 100 mg by mouth daily.    . naproxen (NAPROSYN) 500 MG tablet Take 500 mg by mouth as needed. Pain    . omeprazole (PRILOSEC) 20 MG capsule Take 20 mg by mouth 2 (two) times daily.      Marland Kitchen warfarin (COUMADIN) 5 MG tablet TAKE ONE TABLET BY MOUTH ONCE DAILY, EXCEPT TUESDAYS AND THURSDAYS TAKE 1/2 TABLET. 30 tablet 4   No current facility-administered medications for this visit.      Past Medical History:  Diagnosis Date  . A-fib ([place])   . Anemia    remote, required transfusion. pt has taken chronic iron since  . Arthritis   . Atrial fibrillation status post cardioversion ([place])   . Chronic kidney disease    freg uti.        . Dizziness   . Esophageal  stricture    several times and food impaction once per patient Lady Gary)  . H/O hiatal hernia   . Hypertension   . Shortness of breath   . Sleep apnea    last sleep study > 5 yrs   no cpap    ROS:   All systems reviewed and negative except as noted in the HPI.   Past Surgical History:  Procedure Laterality Date  . ANTERIOR CERVICAL DECOMP/DISCECTOMY FUSION N/A 02/14/2013   Procedure: ANTERIOR CERVICAL DECOMPRESSION/DISCECTOMY FUSION 2 LEVELS;  Surgeon: Charlie Pitter, MD;  Location: [facility];  Service: Neurosurgery;  Laterality: N/A;  . COLONOSCOPY  2009   [facility]-Dr. [facility] Golden. three polyps removed. Only two were tubular adenomas/adenomas.  . COLONOSCOPY  06/03/2012   Procedure: COLONOSCOPY;  Surgeon: Daneil Dolin, MD;  [facility];  Service: Endoscopy;  Laterality: N/A;  8:30  . FOOT SURGERY  2011   right  . HERNIA REPAIR    . VOLVULUS REDUCTION  [id number]     Family History  Problem Relation Age of Onset  . Heart disease Mother     Pacemaker  . Diabetes Father   . Colon cancer Neg Hx  Social History   Social History  . Marital status: Married    Spouse name: N/A  . Number of children: N/A  . 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 Smoking status: Never Smoker  . Smokeless tobacco: Never Used  . Alcohol use No  . Drug use: No  . Sexual activity: Not on file   Other Topics Concern  . Not on file   Social History Narrative   No regular exercise     BP 140/84   Pulse 71   Ht 6\' 2"  (1.88 m)   Wt 292 lb (132.5 kg)   SpO2 95%   BMI 37.49 kg/m   Physical Exam:  obese appearing 71 yo man, NAD HEENT: Unremarkable Neck:  6 cm JVD, no thyromegally Lymphatics:  No adenopathy Back:  No CVA tenderness Lungs:  Clear with no wheezes, minimal scattered basilar rales. HEART:  IRegular rate rhythm, no murmurs, no rubs, no clicks Abd:  soft, positive bowel sounds, no  organomegally, no rebound, no guarding Ext:  2 plus pulses, 1+ peripheral edema, no cyanosis, no clubbing Skin:  No rashes no nodules Neuro:  CN II through XII intact, motor grossly intact  ECG - atrial fib with a CVR and PVC's  Assess/Plan: 1. chronic diastolic heart failure - He is stable class 2. He will continue his current meds. I have asked him to reduce his salt intake and lose weight 2. Atrial fib - his rate appears to be well controlled. Will check in several months 3. HTN - his blood pressure is better on coreg. Will continue a low sodium diet. 4. Obesity - I have asked the patient to lose weight. 5. Preoperative eval - his is an acceptable surgical risk for foot surgery. He may hold his coumadin for 5 days prior to surgery.  Mikle Bosworth.D.

## 2016-12-21 NOTE — Telephone Encounter (Signed)
Completed Pre-operative request faxed to [facility] : Malachy Mood. [phone]. Comf. page received.

## 2016-12-22 ENCOUNTER — Other Ambulatory Visit (INDEPENDENT_AMBULATORY_CARE_PROVIDER_SITE_OTHER): Payer: Self-pay | Admitting: Family

## 2016-12-26 DIAGNOSIS — T8484XA Pain due to internal orthopedic prosthetic devices, implants and grafts, initial encounter: Secondary | ICD-10-CM | POA: Diagnosis not present

## 2016-12-26 DIAGNOSIS — G8918 Other acute postprocedural pain: Secondary | ICD-10-CM | POA: Diagnosis not present

## 2016-12-26 DIAGNOSIS — T84116A Breakdown (mechanical) of internal fixation device of bone of right lower leg, initial encounter: Secondary | ICD-10-CM | POA: Diagnosis not present

## 2016-12-27 ENCOUNTER — Telehealth (INDEPENDENT_AMBULATORY_CARE_PROVIDER_SITE_OTHER): Payer: Self-pay | Admitting: *Deleted

## 2016-12-27 NOTE — Telephone Encounter (Signed)
Called and sw pt ( daughter not on hippa form since 2014) advised he does not need to wear the shoe at night

## 2016-12-27 NOTE — Telephone Encounter (Signed)
Pt daughter calling asking about shoe that pt was sent home with, asking if it needs to be worn 24/7. [phone]

## 2017-01-05 ENCOUNTER — Ambulatory Visit (INDEPENDENT_AMBULATORY_CARE_PROVIDER_SITE_OTHER): Payer: Medicare Other | Admitting: Orthopedic Surgery

## 2017-01-05 DIAGNOSIS — T85848S Pain due to other internal prosthetic devices, implants and grafts, sequela: Secondary | ICD-10-CM

## 2017-01-05 NOTE — Progress Notes (Signed)
Office Visit Note   Patient: Brent Mason           Date of Birth: 06-Apr-1946           MRN: [id number] Visit Date: 01/05/2017              Requested by: Redmond School, MD [address] [place], [place] [address] PCP: Glo Herring., MD  No chief complaint on file.   HPI: Is a 71 year old gentleman who is 10 days status post excision of hardware from his right foot. No complaints. States he feels the best to selling years. Sutures will be harvested today.  Assessment & Plan: Visit Diagnoses:  1. Pain from implanted hardware, sequela     Plan: Keep a dry dressing over the wound until healed. Sutures harvested without incident. May resume regular shoe wear and activities as tolerated.  Follow-Up Instructions: Return if symptoms worsen or fail to improve.   Ortho Exam  Patient is alert, oriented, no adenopathy, well-dressed, normal affect, normal respiratory effort. Incision is clean dry and intact there is no gaping no drainage. No surrounding erythema sign of infection Imaging: No results found.  Labs: No results found for: HGBA1C, ESRSEDRATE, CRP, LABURIC, REPTSTATUS, GRAMSTAIN, CULT, LABORGA  Orders:  No orders of the defined types were placed in this encounter.  No orders of the defined types were placed in this encounter.    Procedures: No procedures performed  Clinical Data: No additional findings.  ROS: Review of Systems  Constitutional: Negative for chills and fever.    Objective: Vital Signs: There were no vitals taken for this visit.  Specialty Comments:  No specialty comments available.  PMFS History: Patient Active Problem List   Diagnosis Date Noted  . S/P foot surgery, right 12/05/2016  . Pain in right foot 12/05/2016  . Pain from implanted hardware 12/05/2016  . Bilateral carotid bruits 07/08/2014  . Encounter for therapeutic drug monitoring 11/13/2013  . Claudication of left lower extremity ([place]) 12/02/2012  . Neck pain  09/16/2012  . Cervical spondylosis without myelopathy 09/10/2012  . Hx of adenomatous colonic polyps 05/07/2012  . Rectal bleeding 05/07/2012  . GERD (gastroesophageal reflux disease) 05/07/2012  . Long term current use of anticoagulant 01/25/2011  . HYPERLIPIDEMIA 11/21/2010  . DYSPNEA 10/20/2010  . MORBID OBESITY 09/01/2009  . Atrial fibrillation ([place]) 09/01/2009  . Essential hypertension 06/14/2009  . ESOPHAGEAL STRICTURE 06/14/2009  . DIZZINESS 06/14/2009   Past Medical History:  Diagnosis Date  . A-fib ([place])   . Anemia    remote, required transfusion. pt has taken chronic iron since  . Arthritis   . Atrial fibrillation status post cardioversion ([place])   . Chronic kidney disease    freg uti.        . Dizziness   . Esophageal stricture    several times and food impaction once per patient Lady Gary)  . H/O hiatal hernia   . Hypertension   . Shortness of breath   . Sleep apnea    last sleep study > 5 yrs   no cpap    Family History  Problem Relation Age of Onset  . Heart disease Mother     Pacemaker  . Diabetes Father   . Colon cancer Neg Hx     Past Surgical History:  Procedure Laterality Date  . ANTERIOR CERVICAL DECOMP/DISCECTOMY FUSION N/A 02/14/2013   Procedure: ANTERIOR CERVICAL DECOMPRESSION/DISCECTOMY FUSION 2 LEVELS;  Surgeon: Charlie Pitter, MD;  Location: [facility];  Service: Neurosurgery;  Laterality: N/A;  . COLONOSCOPY  2009   [facility]-Dr. Laural Golden. three polyps removed. Only two were tubular adenomas/adenomas.  . COLONOSCOPY  06/03/2012   Procedure: COLONOSCOPY;  Surgeon: Daneil Dolin, MD;  [facility];  Service: Endoscopy;  Laterality: N/A;  8:30  . FOOT SURGERY  2011   right  . HERNIA REPAIR    . VOLVULUS REDUCTION  [id number]   Social History   Occupational History  . 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 Smoking status: Never Smoker  . Smokeless tobacco: Never Used  . Alcohol use No  . Drug use: No  .  Sexual activity: Not on file

## 2017-01-08 DIAGNOSIS — N3020 Other chronic cystitis without hematuria: Secondary | ICD-10-CM | POA: Diagnosis not present

## 2017-01-08 DIAGNOSIS — N400 Enlarged prostate without lower urinary tract symptoms: Secondary | ICD-10-CM | POA: Diagnosis not present

## 2017-01-10 DIAGNOSIS — Z6838 Body mass index (BMI) 38.0-38.9, adult: Secondary | ICD-10-CM | POA: Diagnosis not present

## 2017-01-10 DIAGNOSIS — M1991 Primary osteoarthritis, unspecified site: Secondary | ICD-10-CM | POA: Diagnosis not present

## 2017-01-10 DIAGNOSIS — G894 Chronic pain syndrome: Secondary | ICD-10-CM | POA: Diagnosis not present

## 2017-01-15 ENCOUNTER — Ambulatory Visit (INDEPENDENT_AMBULATORY_CARE_PROVIDER_SITE_OTHER): Payer: Medicare Other | Admitting: *Deleted

## 2017-01-15 DIAGNOSIS — Z5181 Encounter for therapeutic drug level monitoring: Secondary | ICD-10-CM

## 2017-01-15 DIAGNOSIS — Z7901 Long term (current) use of anticoagulants: Secondary | ICD-10-CM | POA: Diagnosis not present

## 2017-01-15 DIAGNOSIS — I4891 Unspecified atrial fibrillation: Secondary | ICD-10-CM

## 2017-01-15 LAB — POCT INR: INR: 1.9

## 2017-02-13 ENCOUNTER — Other Ambulatory Visit: Payer: Self-pay | Admitting: Adult Health

## 2017-04-09 ENCOUNTER — Ambulatory Visit (INDEPENDENT_AMBULATORY_CARE_PROVIDER_SITE_OTHER): Payer: Medicare Other | Admitting: *Deleted

## 2017-04-09 DIAGNOSIS — I4891 Unspecified atrial fibrillation: Secondary | ICD-10-CM | POA: Diagnosis not present

## 2017-04-09 DIAGNOSIS — Z5181 Encounter for therapeutic drug level monitoring: Secondary | ICD-10-CM | POA: Diagnosis not present

## 2017-04-09 DIAGNOSIS — Z7901 Long term (current) use of anticoagulants: Secondary | ICD-10-CM

## 2017-04-09 LAB — POCT INR: INR: 2.5

## 2017-04-19 ENCOUNTER — Encounter: Payer: Self-pay | Admitting: Internal Medicine

## 2017-05-17 DIAGNOSIS — I4891 Unspecified atrial fibrillation: Secondary | ICD-10-CM | POA: Diagnosis not present

## 2017-05-17 DIAGNOSIS — I10 Essential (primary) hypertension: Secondary | ICD-10-CM | POA: Diagnosis not present

## 2017-05-17 DIAGNOSIS — Z6837 Body mass index (BMI) 37.0-37.9, adult: Secondary | ICD-10-CM | POA: Diagnosis not present

## 2017-05-17 DIAGNOSIS — N400 Enlarged prostate without lower urinary tract symptoms: Secondary | ICD-10-CM | POA: Diagnosis not present

## 2017-05-21 ENCOUNTER — Ambulatory Visit (INDEPENDENT_AMBULATORY_CARE_PROVIDER_SITE_OTHER): Payer: Medicare Other | Admitting: *Deleted

## 2017-05-21 DIAGNOSIS — Z5181 Encounter for therapeutic drug level monitoring: Secondary | ICD-10-CM

## 2017-05-21 DIAGNOSIS — Z7901 Long term (current) use of anticoagulants: Secondary | ICD-10-CM

## 2017-05-21 DIAGNOSIS — I4891 Unspecified atrial fibrillation: Secondary | ICD-10-CM | POA: Diagnosis not present

## 2017-05-21 LAB — POCT INR: INR: 4.7

## 2017-05-28 ENCOUNTER — Ambulatory Visit (INDEPENDENT_AMBULATORY_CARE_PROVIDER_SITE_OTHER): Payer: Medicare Other

## 2017-05-28 ENCOUNTER — Encounter (INDEPENDENT_AMBULATORY_CARE_PROVIDER_SITE_OTHER): Payer: Self-pay | Admitting: Physician Assistant

## 2017-05-28 ENCOUNTER — Ambulatory Visit (INDEPENDENT_AMBULATORY_CARE_PROVIDER_SITE_OTHER): Payer: Medicare Other | Admitting: Physician Assistant

## 2017-05-28 DIAGNOSIS — M1711 Unilateral primary osteoarthritis, right knee: Secondary | ICD-10-CM

## 2017-05-28 DIAGNOSIS — M1712 Unilateral primary osteoarthritis, left knee: Secondary | ICD-10-CM | POA: Diagnosis not present

## 2017-05-28 DIAGNOSIS — M170 Bilateral primary osteoarthritis of knee: Secondary | ICD-10-CM | POA: Insufficient documentation

## 2017-05-28 MED ORDER — LIDOCAINE HCL 1 % IJ SOLN
3.0000 mL | INTRAMUSCULAR | Status: AC | PRN
  Administered 2017-05-28: 3 mL

## 2017-05-28 MED ORDER — METHYLPREDNISOLONE ACETATE 40 MG/ML IJ SUSP
40.0000 mg | INTRAMUSCULAR | Status: AC | PRN
  Administered 2017-05-28: 40 mg via INTRA_ARTICULAR

## 2017-05-28 NOTE — Progress Notes (Signed)
Office Visit Note   Patient: Brent Mason           Date of Birth: 1946-08-01           MRN: [id number] Visit Date: 05/28/2017              Requested by: Redmond School, [place], [place] [address] PCP: Redmond School, MD   Assessment & Plan: Visit Diagnoses:  1. Primary osteoarthritis of both knees     Plan: He'll work on Forensic scientist. I cautioned him about using inserts with his Coumadin. Did discuss natural anti-inflammatories with him. We'll see him back on an as-needed basis. He understands he can have cortisone injections in both knees no frequent than every 3 months. He may get benefit from a supplemental injection in the knee in the future.  Follow-Up Instructions: No Follow-up on file.   Orders:  Orders Placed This Encounter  Procedures  . Large Joint Injection/Arthrocentesis  . Large Joint Injection/Arthrocentesis  . XR KNEE 3 VIEW LEFT  . XR KNEE 3 VIEW RIGHT   No orders of the defined types were placed in this encounter.     Procedures: No procedures performed   Clinical Data: No additional findings.   Subjective: Chief Complaint  Patient presents with  . Right Knee - Pain  . Left Knee - Pain    HPI Brent Mason is a 71 year old male who comes in today with bilateral knee pain. Left knee pain is worse than right. Points to the lateral aspect of the left knee as his source of pain. Pain is 6 out of 10 pain in worse. States pain comes and goes. No radiation of pain down either leg. Takes hydrocodone for pain. He has A. fib and is on Coumadin and unable to take NSAIDs. He is having no mechanical symptoms of either knee. He has had no known injury to either knee.  Review of Systems Please see history of present illness otherwise negative  Objective: Vital Signs: There were no vitals taken for this visit.  Physical Exam  Constitutional: He is oriented to person, place, and time. He appears well-developed and  well-nourished. No distress.  Eyes: EOM are normal.  Neurological: He is alert and oriented to person, place, and time.  Skin: Skin is warm and dry. He is not diaphoretic.    Ortho Exam Bilateral knees he has evidence of old Osgood slaughter's deformities. No effusion abnormal warmth erythema. No instability valgus varus stressing. Slight laxity left knee with anterior drawer. Slight tenderness over the lateral aspect of both knees. Patellofemoral crepitus with passive range of motion of the right knee. Has full extension flexion both knees.  Specialty Comments:  No specialty comments available.  Imaging: Xr Knee 3 View Left  Result Date: 05/28/2017 Left knee 3 views: No acute fractures. Knee is well located. Mild to moderate changes in all 3 compartments.  Xr Knee 3 View Right  Result Date: 05/28/2017 Right knee 3 views shows moderately severe lateral compartmental arthritic changes. Moderate to severe patellofemoral changes. Medial joint line and mild narrowing. No acute fractures. Otherwise knee well located.    PMFS History: Patient Active Problem List   Diagnosis Date Noted  . Primary osteoarthritis of both knees 05/28/2017  . S/P foot surgery, right 12/05/2016  . Pain in right foot 12/05/2016  . Pain from implanted hardware 12/05/2016  . Bilateral carotid bruits 07/08/2014  . Encounter for therapeutic drug monitoring 11/13/2013  . Claudication of left lower  extremity ([place]) 12/02/2012  . Neck pain 09/16/2012  . Cervical spondylosis without myelopathy 09/10/2012  . Hx of adenomatous colonic polyps 05/07/2012  . Rectal bleeding 05/07/2012  . GERD (gastroesophageal reflux disease) 05/07/2012  . Long term current use of anticoagulant 01/25/2011  . HYPERLIPIDEMIA 11/21/2010  . DYSPNEA 10/20/2010  . MORBID OBESITY 09/01/2009  . Atrial fibrillation ([place]) 09/01/2009  . Essential hypertension 06/14/2009  . ESOPHAGEAL STRICTURE 06/14/2009  . DIZZINESS 06/14/2009   Past  Medical History:  Diagnosis Date  . A-fib ([place])   . Anemia    remote, required transfusion. pt has taken chronic iron since  . Arthritis   . Atrial fibrillation status post cardioversion ([place])   . Chronic kidney disease    freg uti.        . Dizziness   . Esophageal stricture    several times and food impaction once per patient Lady Gary)  . H/O hiatal hernia   . Hypertension   . Shortness of breath   . Sleep apnea    last sleep study > 5 yrs   no cpap    Family History  Problem Relation Age of Onset  . Heart disease Mother        Pacemaker  . Diabetes Father   . Colon cancer Neg Hx     Past Surgical History:  Procedure Laterality Date  . ANTERIOR CERVICAL DECOMP/DISCECTOMY FUSION N/A 02/14/2013   Procedure: ANTERIOR CERVICAL DECOMPRESSION/DISCECTOMY FUSION 2 LEVELS;  Surgeon: Charlie Pitter, MD;  Location: [facility];  Service: Neurosurgery;  Laterality: N/A;  . COLONOSCOPY  2009   [facility]-Dr. Laural Golden. three polyps removed. Only two were tubular adenomas/adenomas.  . COLONOSCOPY  06/03/2012   Procedure: COLONOSCOPY;  Surgeon: Daneil Dolin, MD;  [facility];  Service: Endoscopy;  Laterality: N/A;  8:30  . FOOT SURGERY  2011   right  . HERNIA REPAIR    . VOLVULUS REDUCTION  [id number]   Social History   Occupational History  . 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 Smoking status: Never Smoker  . Smokeless tobacco: Never Used  . Alcohol use No  . Drug use: No  . Sexual activity: Not on file

## 2017-05-28 NOTE — Progress Notes (Signed)
   Procedure Note  Patient: Brent Mason             Date of Birth: [birth year]           MRN: [id number]             Visit Date: 05/28/2017   Procedures: Visit Diagnoses: Primary osteoarthritis of both knees - Plan: XR KNEE 3 VIEW LEFT, XR KNEE 3 VIEW RIGHT  Large Joint Inj Date/Time: 05/28/2017 2:21 PM Performed by: Pete Pelt Authorized by: Pete Pelt   Consent Given by:  Patient Indications:  Pain Location:  Knee Site:  L knee Needle Size:  22 G Approach:  Anterolateral Ultrasound Guidance: No   Fluoroscopic Guidance: No   Medications:  40 mg methylPREDNISolone acetate 40 MG/ML; 3 mL lidocaine 1 % Aspiration Attempted: No   Patient tolerance:  Patient tolerated the procedure well with no immediate complications Large Joint Inj Date/Time: 05/28/2017 2:24 PM Performed by: Pete Pelt Authorized by: Pete Pelt   Consent Given by:  Patient Indications:  Pain Location:  Knee Site:  R knee Needle Size:  22 G Approach:  Anterolateral Ultrasound Guidance: No   Fluoroscopic Guidance: No   Medications:  40 mg methylPREDNISolone acetate 40 MG/ML; 3 mL lidocaine 1 % Aspiration Attempted: No   Patient tolerance:  Patient tolerated the procedure well with no immediate complications

## 2017-06-20 ENCOUNTER — Ambulatory Visit (INDEPENDENT_AMBULATORY_CARE_PROVIDER_SITE_OTHER): Payer: Medicare Other | Admitting: *Deleted

## 2017-06-20 DIAGNOSIS — Z7901 Long term (current) use of anticoagulants: Secondary | ICD-10-CM | POA: Diagnosis not present

## 2017-06-20 DIAGNOSIS — I4891 Unspecified atrial fibrillation: Secondary | ICD-10-CM | POA: Diagnosis not present

## 2017-06-20 DIAGNOSIS — Z5181 Encounter for therapeutic drug level monitoring: Secondary | ICD-10-CM

## 2017-06-20 LAB — POCT INR: INR: 3

## 2017-07-25 ENCOUNTER — Ambulatory Visit (INDEPENDENT_AMBULATORY_CARE_PROVIDER_SITE_OTHER): Payer: Medicare Other | Admitting: *Deleted

## 2017-07-25 DIAGNOSIS — I4891 Unspecified atrial fibrillation: Secondary | ICD-10-CM | POA: Diagnosis not present

## 2017-07-25 DIAGNOSIS — Z7901 Long term (current) use of anticoagulants: Secondary | ICD-10-CM

## 2017-07-25 DIAGNOSIS — Z5181 Encounter for therapeutic drug level monitoring: Secondary | ICD-10-CM

## 2017-07-25 LAB — POCT INR: INR: 3.9

## 2017-08-08 ENCOUNTER — Ambulatory Visit (INDEPENDENT_AMBULATORY_CARE_PROVIDER_SITE_OTHER): Payer: Medicare Other | Admitting: *Deleted

## 2017-08-08 DIAGNOSIS — I4891 Unspecified atrial fibrillation: Secondary | ICD-10-CM | POA: Diagnosis not present

## 2017-08-08 DIAGNOSIS — Z5181 Encounter for therapeutic drug level monitoring: Secondary | ICD-10-CM

## 2017-08-08 DIAGNOSIS — Z7901 Long term (current) use of anticoagulants: Secondary | ICD-10-CM | POA: Diagnosis not present

## 2017-08-08 LAB — POCT INR: INR: 3.1

## 2017-08-28 ENCOUNTER — Telehealth (INDEPENDENT_AMBULATORY_CARE_PROVIDER_SITE_OTHER): Payer: Self-pay | Admitting: Orthopaedic Surgery

## 2017-08-28 DIAGNOSIS — J329 Chronic sinusitis, unspecified: Secondary | ICD-10-CM | POA: Diagnosis not present

## 2017-08-28 DIAGNOSIS — M1991 Primary osteoarthritis, unspecified site: Secondary | ICD-10-CM | POA: Diagnosis not present

## 2017-08-28 DIAGNOSIS — I4891 Unspecified atrial fibrillation: Secondary | ICD-10-CM | POA: Diagnosis not present

## 2017-08-28 DIAGNOSIS — Z6837 Body mass index (BMI) 37.0-37.9, adult: Secondary | ICD-10-CM | POA: Diagnosis not present

## 2017-08-28 NOTE — Telephone Encounter (Signed)
Returned call to patient's wife Mardene Celeste) left message to call back [phone]

## 2017-09-05 ENCOUNTER — Ambulatory Visit (INDEPENDENT_AMBULATORY_CARE_PROVIDER_SITE_OTHER): Payer: Medicare Other | Admitting: *Deleted

## 2017-09-05 DIAGNOSIS — I4891 Unspecified atrial fibrillation: Secondary | ICD-10-CM

## 2017-09-05 DIAGNOSIS — Z7901 Long term (current) use of anticoagulants: Secondary | ICD-10-CM

## 2017-09-05 DIAGNOSIS — Z5181 Encounter for therapeutic drug level monitoring: Secondary | ICD-10-CM

## 2017-09-05 LAB — POCT INR: INR: 2.8

## 2017-10-10 ENCOUNTER — Encounter: Payer: Self-pay | Admitting: Internal Medicine

## 2017-10-10 ENCOUNTER — Encounter (INDEPENDENT_AMBULATORY_CARE_PROVIDER_SITE_OTHER): Payer: Self-pay | Admitting: Surgery

## 2017-10-10 ENCOUNTER — Ambulatory Visit (INDEPENDENT_AMBULATORY_CARE_PROVIDER_SITE_OTHER): Payer: Medicare Other | Admitting: Surgery

## 2017-10-10 VITALS — BP 149/82 | HR 67 | Ht 74.0 in | Wt 285.0 lb

## 2017-10-10 DIAGNOSIS — M7052 Other bursitis of knee, left knee: Secondary | ICD-10-CM

## 2017-10-10 DIAGNOSIS — M1711 Unilateral primary osteoarthritis, right knee: Secondary | ICD-10-CM | POA: Diagnosis not present

## 2017-10-10 DIAGNOSIS — M1712 Unilateral primary osteoarthritis, left knee: Secondary | ICD-10-CM | POA: Diagnosis not present

## 2017-10-10 MED ORDER — BUPIVACAINE HCL 0.25 % IJ SOLN
6.0000 mL | INTRAMUSCULAR | Status: AC | PRN
Start: 2017-10-10 — End: 2017-10-10
  Administered 2017-10-10: 6 mL via INTRA_ARTICULAR

## 2017-10-10 MED ORDER — METHYLPREDNISOLONE ACETATE 40 MG/ML IJ SUSP
40.0000 mg | INTRAMUSCULAR | Status: AC | PRN
  Administered 2017-10-10: 40 mg via INTRA_ARTICULAR

## 2017-10-10 MED ORDER — LIDOCAINE HCL 1 % IJ SOLN
3.0000 mL | INTRAMUSCULAR | Status: AC | PRN
  Administered 2017-10-10: 3 mL

## 2017-10-10 NOTE — Progress Notes (Signed)
Office Visit Note   Patient: Brent Mason           Date of Birth: 1946/06/19           MRN: [id number] Visit Date: 10/10/2017              Requested by: Redmond School, [place], [place] [address] PCP: Redmond School, MD   Assessment & Plan: Visit Diagnoses:  1. Pes anserinus bursitis of left knee   2. Arthritis of left knee   3. Arthritis of right knee     Plan: The try to give patient some relief of performed right knee interarticular Marcaine/Depo-Medrol injection and left pes bursa injection. Varus patient and daughter who was present that best treatment option for right knee would be total knee replacement. States that he does not want to have this done because he takes care of his wife who is not in the best of health. Can follow with Dr. Ninfa Linden as needed.  Follow-Up Instructions: Return if symptoms worsen or fail to improve.   Orders:  Orders Placed This Encounter  Procedures  . Large Joint Inj  . Large Joint Inj   No orders of the defined types were placed in this encounter.     Procedures: Large Joint Inj on 10/10/2017 2:43 PM Indications: pain Details: 25 G 1.5 in needle, anteromedial approach  Arthrogram: No  Medications: 3 mL lidocaine 1 %; 6 mL bupivacaine 0.25 %; 40 mg methylPREDNISolone acetate 40 MG/ML Outcome: tolerated well, no immediate complications Consent was given by the patient. Patient was prepped and draped in the usual sterile fashion.   Large Joint Inj: L knee (Pes bursa) on 10/10/2017 2:44 PM Indications: pain Details: 25 G 1.5 in needle Outcome: tolerated well, no immediate complications Consent was given by the patient.       Clinical Data: No additional findings.   Subjective: Chief Complaint  Patient presents with  . Left Knee - Pain  . Right Knee - Pain    HPI 71 year old white male comes in with complaints of right greater left knee pain. Has no history of DJD. Last seen in Dr.  Trevor Mace clinic August 2018 and had bilateral knee intra-articular injection performed. States the right knee is worse. Pain more localized to the medial lateral joint line. Left knee pain more localized to the pes bursa area.  Both knees aggravated when he is up and ambulating. He understands that definitive treatment would be total knee replacements but he is not want to go that route since he is taking care of his wife who is not in the best of health. Patient is on chronic Coumadin for atrial fibrillation. Review of Systems No current cardiopulmonary GI GU issues.  Objective: Vital Signs: BP (!) 149/82   Pulse 67   Ht 6\' 2"  (1.88 m)   Wt 285 lb (129.3 kg)   BMI 36.59 kg/m   Physical Exam  Constitutional: He is oriented to person, place, and time. No distress.  HENT:  Head: Normocephalic and atraumatic.  Eyes: EOM are normal.  Musculoskeletal:  Right knee positive crepitus. Medial lateral joint line tenderness. Some swelling without large effusion. Left knee he is markedly tender at the pes bursa. Joint line definitely not as tender. Neurovascularly intact. Negative logroll bilateral hips.  Neurological: He is alert and oriented to person, place, and time.  Skin: Skin is warm and dry.  Psychiatric: He has a normal mood and affect.    Ortho Exam  Specialty Comments:  No specialty comments available.  Imaging: No results found.   PMFS History: Patient Active Problem List   Diagnosis Date Noted  . Primary osteoarthritis of both knees 05/28/2017  . S/P foot surgery, right 12/05/2016  . Pain in right foot 12/05/2016  . Pain from implanted hardware 12/05/2016  . Bilateral carotid bruits 07/08/2014  . Encounter for therapeutic drug monitoring 11/13/2013  . Claudication of left lower extremity ([place]) 12/02/2012  . Neck pain 09/16/2012  . Cervical spondylosis without myelopathy 09/10/2012  . Hx of adenomatous colonic polyps 05/07/2012  . Rectal bleeding 05/07/2012  . GERD  (gastroesophageal reflux disease) 05/07/2012  . Long term current use of anticoagulant 01/25/2011  . HYPERLIPIDEMIA 11/21/2010  . DYSPNEA 10/20/2010  . MORBID OBESITY 09/01/2009  . Atrial fibrillation ([place]) 09/01/2009  . Essential hypertension 06/14/2009  . ESOPHAGEAL STRICTURE 06/14/2009  . DIZZINESS 06/14/2009   Past Medical History:  Diagnosis Date  . A-fib ([place])   . Anemia    remote, required transfusion. pt has taken chronic iron since  . Arthritis   . Atrial fibrillation status post cardioversion ([place])   . Chronic kidney disease    freg uti.        . Dizziness   . Esophageal stricture    several times and food impaction once per patient Lady Gary)  . H/O hiatal hernia   . Hypertension   . Shortness of breath   . Sleep apnea    last sleep study > 5 yrs   no cpap    Family History  Problem Relation Age of Onset  . Heart disease Mother        Pacemaker  . Diabetes Father   . Colon cancer Neg Hx     Past Surgical History:  Procedure Laterality Date  . ANTERIOR CERVICAL DECOMP/DISCECTOMY FUSION N/A 02/14/2013   Procedure: ANTERIOR CERVICAL DECOMPRESSION/DISCECTOMY FUSION 2 LEVELS;  Surgeon: Charlie Pitter, MD;  Location: [facility];  Service: Neurosurgery;  Laterality: N/A;  . COLONOSCOPY  2009   [facility]-Dr. Laural Golden. three polyps removed. Only two were tubular adenomas/adenomas.  . COLONOSCOPY  06/03/2012   Procedure: COLONOSCOPY;  Surgeon: Daneil Dolin, MD;  [facility];  Service: Endoscopy;  Laterality: N/A;  8:30  . FOOT SURGERY  2011   right  . HERNIA REPAIR    . VOLVULUS REDUCTION  [id number]   Social History   Occupational History  . 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 Smoking status: Never Smoker  . Smokeless tobacco: Never Used  Substance and Sexual Activity  . Alcohol use: No    Alcohol/week: 0.0 oz  . Drug use: No  . Sexual activity: Not on file

## 2017-10-13 ENCOUNTER — Other Ambulatory Visit: Payer: Self-pay | Admitting: Adult Health

## 2017-10-17 ENCOUNTER — Ambulatory Visit: Payer: Medicare Other | Admitting: Orthopedic Surgery

## 2017-10-17 ENCOUNTER — Telehealth (INDEPENDENT_AMBULATORY_CARE_PROVIDER_SITE_OTHER): Payer: Self-pay | Admitting: Orthopaedic Surgery

## 2017-10-17 ENCOUNTER — Ambulatory Visit (INDEPENDENT_AMBULATORY_CARE_PROVIDER_SITE_OTHER): Payer: Medicare Other | Admitting: *Deleted

## 2017-10-17 DIAGNOSIS — Z7901 Long term (current) use of anticoagulants: Secondary | ICD-10-CM

## 2017-10-17 DIAGNOSIS — Z5181 Encounter for therapeutic drug level monitoring: Secondary | ICD-10-CM | POA: Diagnosis not present

## 2017-10-17 DIAGNOSIS — I4891 Unspecified atrial fibrillation: Secondary | ICD-10-CM | POA: Diagnosis not present

## 2017-10-17 LAB — POCT INR: INR: 2

## 2017-10-17 NOTE — Telephone Encounter (Signed)
See below

## 2017-10-17 NOTE — Telephone Encounter (Signed)
Patient's daughter Veterinary surgeon) called wanting to let you know that they as a family talked and are wanting to move forward with the total knee replacement.  Also, she is needing the paperwork sent over to his cardiologist, Dr. Cristopher Peru with [facility], so that he can be cleared for surgery.  CB# [phone]

## 2017-10-17 NOTE — Telephone Encounter (Signed)
We saw this patient in August and Artis Delay put steroid injections in both his knees.  It is coming up on 5 months since we saw him last so we actually need to set him up for a visit in the office as soon as possible so we can see him prior to setting the surgery up and came forward notes on to the cardiologist obtaining clearance for the surgery.  Again mainly for insurance purposes we have to document a visit again since wearing a new year and it was August when I saw him last.

## 2017-10-18 NOTE — Telephone Encounter (Signed)
Can we please set him for an appointment first before we can schedule surgery since it's been quite a while since we have seen him

## 2017-10-19 NOTE — Telephone Encounter (Signed)
Patient has an appointment on January 21st @4 :00.

## 2017-11-05 ENCOUNTER — Ambulatory Visit (INDEPENDENT_AMBULATORY_CARE_PROVIDER_SITE_OTHER): Payer: Medicare Other | Admitting: Orthopaedic Surgery

## 2017-11-05 DIAGNOSIS — M1711 Unilateral primary osteoarthritis, right knee: Secondary | ICD-10-CM | POA: Diagnosis not present

## 2017-11-05 DIAGNOSIS — R7989 Other specified abnormal findings of blood chemistry: Secondary | ICD-10-CM | POA: Diagnosis not present

## 2017-11-05 NOTE — Progress Notes (Signed)
The patient comes in today for continued follow-up significant arthritis involving his right knee.  He is tried and failed all forms of conservative treatment at this point.  His wife is with him as well as both his daughters.  They are all interested with him proceeding with knee replacement surgery as a see.  His pain is daily and is 10 out of 10.  He is on Coumadin for atrial fibrillation but this is been going on for years and years.  He can come off of Coumadin to have this surgery without needing to be bridged with anything.  He denies any active medical problems at the moment in terms of any chest pain, shortness of breath, fever, chills, nausea, vomiting.  His knee has known severe arthritis were documented with x-rays as well.  Surprisingly x-rays show much more aware on the lateral compartment and medial on that right knee.  His range of motion is come significant painful for him.  It is detrimentally affected his activities daily living, his quality of life and his mobility.  In the office we went over knee replacement model and talked in detail with the surgery involves including a thorough discussion of the risk and benefits of surgery showing a knee model.  We had a thorough discussion of what the intraoperative and postoperative course would be.  All questions and concerns were answered and addressed.  We will work on getting this scheduled soon.  We would need to have him stop Coumadin 6 days preoperative.

## 2017-11-08 ENCOUNTER — Encounter: Payer: Self-pay | Admitting: Internal Medicine

## 2017-11-08 ENCOUNTER — Telehealth: Payer: Self-pay

## 2017-11-08 NOTE — Telephone Encounter (Signed)
   Primary Cardiologist:Dr Lovena Le  Chart reviewed as part of pre-operative protocol coverage. Because of Brent Mason past medical history and time since last visit, he/she will require a follow-up visit in order to better assess preoperative cardiovascular risk.  Pre-op covering staff: - Please schedule appointment and call patient to inform them. - Please contact requesting surgeon's office via preferred method (i.e, phone, fax) to inform them of need for appointment prior to surgery.  Kerin Ransom, PA-C  11/08/2017, 4:56 PM

## 2017-11-08 NOTE — Telephone Encounter (Signed)
    [facility] Pre-operative Risk Assessment    Request for surgical clearance:  1. What type of surgery is being performed? R total knee arthroplasty  2. When is this surgery scheduled? TBD  3. What type of clearance is required (medical clearance vs. Pharmacy clearance to hold med vs. Both)? both  4. Are there any medications that need to be held prior to surgery and how long? None listed   5. Practice name and name of physician performing surgery?  1. [facility] 2. Dr. Zollie Beckers  6. What is your office phone and fax number?  1. Phone [phone] 2. Fax [phone] ATTN: Sherrie    7. Anesthesia type (None, local, MAC, general) ? None specified    _________________________________________________________________   (provider comments below)

## 2017-11-08 NOTE — Telephone Encounter (Signed)
Notification faxed via Epic to requesting office.  Sent to scheduling to arrange f/u. 

## 2017-11-09 ENCOUNTER — Encounter (INDEPENDENT_AMBULATORY_CARE_PROVIDER_SITE_OTHER): Payer: Self-pay | Admitting: Orthopaedic Surgery

## 2017-11-09 NOTE — Telephone Encounter (Signed)
error 

## 2017-11-19 ENCOUNTER — Other Ambulatory Visit (INDEPENDENT_AMBULATORY_CARE_PROVIDER_SITE_OTHER): Payer: Self-pay | Admitting: Physician Assistant

## 2017-11-19 NOTE — Progress Notes (Signed)
Cardiology Office Note    Date:  11/20/2017   ID:  Brent Mason, DOB 16-Sep-1946, MRN [id number]  PCP:  Redmond School, MD  Cardiologist:  Dr. Lovena Le  Chief Complaint  Patient presents with  . Pre-op Exam    History of Present Illness:    Brent Mason is a 72 y.o. male with past medical history of permanent atrial fibrillation (on Coumadin), chronic diastolic CHF, mild AS (by echo in 11/2015), HTN, and OSA (on CPAP) who presents to the office today for preoperative cardiac clearance.  He was last examined by Dr. Lovena Le in 12/2016 and reported slightly worsening dyspnea on exertion at that time but also mentioned his weight was up by 16 pounds. Did not appear volume overloaded by physical examination and was continued on Lasix 40 mg daily at that time along with Coumadin for anticoagulation. He had an upcoming foot surgery scheduled at that time and was instructed to hold Coumadin for 5 days prior to the procedure.  In talking with the patient today, he reports overall doing well from a cardiac perspective since his last office visit. He denies any recent chest discomfort, dyspnea on exertion, palpitations, PND, or lower extremity edema. Does experience occasional orthopnea when he forgets to takes his Lasix dosing.  He is accompanied by his wife and daughter who reports that he was frequently skipping Lasix due to the frequent urination. Since finding out that he would have today's visit, he has been compliant with Lasix dosing over the past week and has experienced an associated 8 pound weight loss with improvement in his breathing.  He remains on Coumadin for anticoagulation and denies any evidence of active bleeding. He is scheduled for a total knee replacement on 12/07/2017.   Past Medical History:  Diagnosis Date  . A-fib ([place])   . Anemia    remote, required transfusion. pt has taken chronic iron since  . Arthritis   . Atrial fibrillation status post cardioversion ([place])    . Chronic kidney disease    freg uti.        . Dizziness   . Esophageal stricture    several times and food impaction once per patient Lady [facility])  . H/O hiatal hernia   . Hypertension   . Shortness of breath   . Sleep apnea    last sleep study > 5 yrs   no cpap    Past Surgical History:  Procedure Laterality Date  . ANTERIOR CERVICAL DECOMP/DISCECTOMY FUSION N/A 02/14/2013   Procedure: ANTERIOR CERVICAL DECOMPRESSION/DISCECTOMY FUSION 2 LEVELS;  Surgeon: Charlie Pitter, MD;  Location: [facility];  Service: Neurosurgery;  Laterality: N/A;  . COLONOSCOPY  2009   [facility]-Dr. Laural Golden. three polyps removed. Only two were tubular adenomas/adenomas.  . COLONOSCOPY  06/03/2012   Procedure: COLONOSCOPY;  Surgeon: Daneil Dolin, MD;  Location: [facility];  Service: Endoscopy;  Laterality: N/A;  8:30  . FOOT SURGERY  2011   right  . HERNIA REPAIR    . VOLVULUS REDUCTION  0211,1552    Current Medications: Outpatient Medications Prior to Visit  Medication Sig Dispense Refill  . atorvastatin (LIPITOR) 10 MG tablet Take 10 mg by mouth daily at 6 PM.   10  . ferrous sulfate 325 (65 FE) MG tablet Take 325 mg by mouth daily with breakfast.      . fluticasone (FLONASE) 50 MCG/ACT nasal spray Place 1 spray into the nose daily.     Marland Kitchen HYDROcodone-acetaminophen (NORCO) 5-325 MG  per tablet Take 1 tablet by mouth every 6 (six) hours as needed for pain. 56 tablet 2  . losartan (COZAAR) 100 MG tablet Take 100 mg by mouth daily.    Marland Kitchen omeprazole (PRILOSEC) 20 MG capsule Take 20 mg by mouth 2 (two) times daily.      Marland Kitchen warfarin (COUMADIN) 5 MG tablet TAKE ONE TABLET BY MOUTH ONCE DAILY, EXCEPT TUESDAYS AND THURSDAYS TAKE 1/2 TABLET. 30 tablet 3  . carvedilol (COREG) 3.125 MG tablet TAKE (1) TABLET BY MOUTH TWICE DAILY. 180 tablet 3  . furosemide (LASIX) 40 MG tablet TAKE ONE TABLET BY MOUTH ONCE DAILY. 90 tablet 3   No facility-administered medications prior to visit.      Allergies:   Patient has no  known allergies.   Social History   Socioeconomic History  . Marital status: Married    Spouse name: None  . Number of children: None  . Years of education: None  . Highest education level: None  Social Needs  . Financial resource strain: None  . Food insecurity - worry: None  . Food insecurity - inability: None  . Transportation needs - medical: None  . Transportation needs - non-medical: None  Occupational History  . 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 Smoking status: Never Smoker  . Smokeless tobacco: Never Used  Substance and Sexual Activity  . Alcohol use: No    Alcohol/week: 0.0 oz  . Drug use: No  . Sexual activity: None  Other Topics Concern  . None  Social History Narrative   No regular exercise     Family History:  The patient's family history includes Diabetes in his father; Heart disease in his mother.   Review of Systems:   Please see the history of present illness.     General:  No chills, fever, night sweats or weight changes.  Cardiovascular:  No chest pain, dyspnea on exertion, edema, palpitations, paroxysmal nocturnal dyspnea. Positive for orthopnea.  Dermatological: No rash, lesions/masses Respiratory: No cough, dyspnea Urologic: No hematuria, dysuria Abdominal:   No nausea, vomiting, diarrhea, bright red blood per rectum, melena, or hematemesis Neurologic:  No visual changes, wkns, changes in mental status. All other systems reviewed and are otherwise negative except as noted above.   Physical Exam:    VS:  BP 126/74   Pulse 72   Ht _0  (1.88 m)   Wt 279 lb (126.6 kg)   SpO2 95%   BMI 35.82 kg/m    General: Well developed, well nourished Caucasian male appearing in no acute distress. Head: Normocephalic, atraumatic, sclera non-icteric, no xanthomas, nares are without discharge.  Neck: No carotid bruits. JVD not elevated.  Lungs: Respirations regular and unlabored, without wheezes or rales.    Heart: Irregularly irregular. No S3 or S4.  No rubs or gallops appreciated. 2/6 SEM along RUSB.  Abdomen: Soft, non-tender, non-distended with normoactive bowel sounds. No hepatomegaly. No rebound/guarding. Prior abdominal scars noted.  Msk:  Strength and tone appear normal for age. No joint deformities or effusions. Extremities: No clubbing or cyanosis. No lower extremity edema.  Distal pedal pulses are 2+ bilaterally. Neuro: Alert and oriented X 3. Moves all extremities spontaneously. No focal deficits noted. Psych:  Responds to questions appropriately with a normal affect. Skin: No rashes or lesions noted  Wt Readings from Last 3 Encounters:  11/20/17 279 lb (126.6 kg)  10/10/17 285 lb (129.3 kg)  12/21/16  292 lb (132.5 kg)     Studies/Labs Reviewed:   EKG:  EKG is ordered today.  The ekg ordered today demonstrates rate-controlled atrial fibrillation, HR 72, with occasional PVC's. No acute ST or T-wave changes when compared to prior tracings.   Recent Labs: No results found for requested labs within last 8760 hours.   Lipid Panel    Component Value Date/Time   CHOL 165 12/02/2012 1438   TRIG 111 12/02/2012 1438   HDL 40 12/02/2012 1438   CHOLHDL 4.1 12/02/2012 1438   VLDL 22 12/02/2012 1438   LDLCALC 103 (H) 12/02/2012 1438    Additional studies/ records that were reviewed today include:   Echocardiogram: 12/14/2015 Study Conclusions  - Left ventricle: The cavity size was normal. Wall thickness was   increased in a pattern of moderate LVH. Systolic function was   normal. The estimated ejection fraction was in the range of 55%   to 60%. Although no diagnostic regional wall motion abnormality   was identified, this possibility cannot be completely excluded on   the basis of this study. The study is not technically sufficient   to allow evaluation of LV diastolic function. - Aortic valve: Mildly to moderately calcified annulus. Trileaflet;   mildly calcified leaflets.  Moderate calcification involving the   left coronary cusp. There was mild to moderate stenosis based on   planimetry and valve gradients. There was mild regurgitation.   Mean gradient (S): 11 mm Hg. Peak gradient (S): 20 mm Hg. VTI   ratio of LVOT to aortic valve: 0.47. Valve area (VTI): 1.33 cm^2.   Valve area (Vmax): 1.41 cm^2. - Mitral valve: Calcified annulus. Moderately calcified leaflets .   There was trivial regurgitation. - Left atrium: The atrium was moderately dilated. - Right ventricle: The cavity size was mildly dilated. - Right atrium: The atrium was moderately dilated. Central venous   pressure (est): 3 mm Hg. - Atrial septum: No defect or patent foramen ovale was identified. - Tricuspid valve: There was mild regurgitation. - Pulmonary arteries: PA peak pressure: 33 mm Hg (S). - Pericardium, extracardiac: There was no pericardial effusion.  Impressions:  - Moderate LVH with LVEF 55-60%. Indeterminate diastolic function   in the setting of atrial fibrillation. Moderate left atrial   enlargement. MAC with moderately calcified mitral leaflets and   trivial mitral regurgitation. Mild to moderate calcific aortic   stenosis with mild aortic regurgitation. Mild RV enlargement.   Moderate right atrial enlargement. Mild tricuspid regurgitation   with PASP 33 mmHg there is mercury.  Assessment:    1. Encounter for pre-operative cardiovascular clearance   2. Chronic diastolic (congestive) heart failure (HCC)   3. Persistent atrial fibrillation ([place])   4. Long term current use of anticoagulant   5. Nonrheumatic aortic valve stenosis   6. Essential hypertension      Plan:   In order of problems listed above:  1. Preoperative Cardiac Clearance - the patient is planning to undergo total knee replacement later this month. He denies any recent chest pain or dyspnea on exertion and has no known history of CAD. He has known persistent atrial fibrillation and chronic diastolic  CHF but rates have been well-controlled and he appears euvolemic on examination. EKG today shows no acute ischemic changes.   - his RCRI risk score is low with a 0.4% risk of major cardiac event. He is of acceptable risk to proceed with his planned procedure without further testing at this time. Discussed with the Coumadin  Clinic, he may hold Coumadin for 5 days prior to the procedure without the need for Lovenox bridging and resume once deemed appropriate by his Orthopedic Surgeon.   2. Chronic Diastolic CHF - echo in 5643 showed a preserved EF of 55-60%. His family does report he frequently skips his Lasix dosing and has orthopnea or dyspnea at that time. He has been compliant with dosing over the past few weeks and dyspnea has been at baseline. Compliance with his medication regimen was strongly encouraged. Will continue on Lasix 26m daily. Obtain a repeat BMET with his upcoming labs. Sodium and fluid restriction also reviewed.   3. Persistent Atrial Fibrillation/ Use of Long-Term Anticoagulation - he denies any recent dyspnea on exertion or palpitations. Continue Coreg 3.122mBID for rate-control. - continue with Coumadin for anticoagulation as he denies any evidence of active bleeding. Can hold Coumadin as outlined above for his upcoming surgery.   4. Aortic Stenosis - echo in 11/2015 showed mild to moderate stenosis with a mean gradient (S): 11 mm Hg and Valve area (VTI): 1.33 cm^2. - this still appears mild on examination today. Will obtain a repeat echocardiogram for further assessment prior to his next visit with Dr. TaLovena LeThis does not need to be obtained prior to surgery.   5. HTN - BP is well-controlled at 126/74 during today's visit.  - continue current medication regimen.    Medication Adjustments/Labs and Tests Ordered: Current medicines are reviewed at length with the patient today.  Concerns regarding medicines are outlined above.  Medication changes, Labs and Tests ordered  today are listed in the Patient Instructions below. Patient Instructions  Medication Instructions:  Your physician recommends that you continue on your current medications as directed. Please refer to the Current Medication list given to you today.  Labwork: NONE   Testing/Procedures: Your physician has requested that you have an echocardiogram. Echocardiography is a painless test that uses sound waves to create images of your heart. It provides your doctor with information about the size and shape of your heart and how well your heart's chambers and valves are working. This procedure takes approximately one hour. There are no restrictions for this procedure.  Follow-Up: Your physician recommends that you schedule a follow-up appointment in: May with Dr. TaLovena LeAny Other Special Instructions Will Be Listed Below (If Applicable). You have been cleared for surgery Stop Taking Coumadin 5 days prior to your procedure   If you need a refill on your cardiac medications before your next appointment, please call your pharmacy.  Thank you for choosing [facility]   Signed, BrErma HeritagePA-C  11/20/2017 7:44 PM    [place] [place]. [address]e[place] [id number]: [phone]

## 2017-11-20 ENCOUNTER — Ambulatory Visit: Payer: Medicare Other | Admitting: Student

## 2017-11-20 ENCOUNTER — Encounter: Payer: Self-pay | Admitting: Student

## 2017-11-20 VITALS — BP 126/74 | HR 72 | Ht 74.0 in | Wt 279.0 lb

## 2017-11-20 DIAGNOSIS — I350 Nonrheumatic aortic (valve) stenosis: Secondary | ICD-10-CM

## 2017-11-20 DIAGNOSIS — Z7901 Long term (current) use of anticoagulants: Secondary | ICD-10-CM | POA: Diagnosis not present

## 2017-11-20 DIAGNOSIS — I481 Persistent atrial fibrillation: Secondary | ICD-10-CM

## 2017-11-20 DIAGNOSIS — I4819 Other persistent atrial fibrillation: Secondary | ICD-10-CM

## 2017-11-20 DIAGNOSIS — Z01810 Encounter for preprocedural cardiovascular examination: Secondary | ICD-10-CM

## 2017-11-20 DIAGNOSIS — I5032 Chronic diastolic (congestive) heart failure: Secondary | ICD-10-CM | POA: Diagnosis not present

## 2017-11-20 DIAGNOSIS — I10 Essential (primary) hypertension: Secondary | ICD-10-CM | POA: Diagnosis not present

## 2017-11-20 MED ORDER — CARVEDILOL 3.125 MG PO TABS: 3.1250 mg | ORAL_TABLET | Freq: Two times a day (BID) | ORAL | 3 refills | Status: DC

## 2017-11-20 MED ORDER — FUROSEMIDE 40 MG PO TABS: 40.0000 mg | ORAL_TABLET | Freq: Every day | ORAL | 3 refills | Status: DC

## 2017-11-20 NOTE — Patient Instructions (Addendum)
Medication Instructions:  Your physician recommends that you continue on your current medications as directed. Please refer to the Current Medication list given to you today.   Labwork: NONE   Testing/Procedures: Your physician has requested that you have an echocardiogram. Echocardiography is a painless test that uses sound waves to create images of your heart. It provides your doctor with information about the size and shape of your heart and how well your heart's chambers and valves are working. This procedure takes approximately one hour. There are no restrictions for this procedure.    Follow-Up: Your physician recommends that you schedule a follow-up appointment in: May with Dr. Lovena Le   Any Other Special Instructions Will Be Listed Below (If Applicable). You have been cleared for surgery Stop Taking Coumadin 5 days prior to your procedure      If you need a refill on your cardiac medications before your next appointment, please call your pharmacy.  Thank you for choosing [facility]!

## 2017-11-21 ENCOUNTER — Other Ambulatory Visit (INDEPENDENT_AMBULATORY_CARE_PROVIDER_SITE_OTHER): Payer: Self-pay

## 2017-11-21 ENCOUNTER — Ambulatory Visit (INDEPENDENT_AMBULATORY_CARE_PROVIDER_SITE_OTHER): Payer: Medicare Other | Admitting: Orthopaedic Surgery

## 2017-11-27 ENCOUNTER — Encounter (HOSPITAL_COMMUNITY): Payer: Self-pay

## 2017-11-27 NOTE — Patient Instructions (Addendum)
Brent Mason  11/27/2017   Your procedure is scheduled on:  12-07-2017  Report to [facility] Main  Entrance  Report to admitting at  6:30AM Follow signs to Short Stay on first floor at 530 AM  Call this number if you have problems the morning of surgery [phone]   Remember: Do not eat food or drink liquids :After Midnight.     Take these medicines the morning of surgery with A SIP OF WATER:   Carvedilol, Flonase nasal spray, atorvastatin                                You may not have any metal on your body including hair pins and              piercings  Do not wear jewelry, make-up, lotions, powders or perfumes, deodorant                           Men may shave face and neck.   Do not bring valuables to the hospital. [place].  Contacts, dentures or bridgework may not be worn into surgery.  Leave suitcase in the car. After surgery it may be brought to your room.     Special Instructions: N/A              Please read over the following fact sheets you were given: _____________________________________________________________________             [facility] - Preparing for Surgery Before surgery, you can play an important role.  Because skin is not sterile, your skin needs to be as free of germs as possible.  You can reduce the number of germs on your skin by washing with CHG (chlorahexidine gluconate) soap before surgery.  CHG is an antiseptic cleaner which kills germs and bonds with the skin to continue killing germs even after washing. Please DO NOT use if you have an allergy to CHG or antibacterial soaps.  If your skin becomes reddened/irritated stop using the CHG and inform your nurse when you arrive at Short Stay. Do not shave (including legs and underarms) for at least 48 hours prior to the first CHG shower.  You may shave your face/neck. Please follow these instructions carefully:  1.   Shower with CHG Soap the night before surgery and the  morning of Surgery.  2.  If you choose to wash your hair, wash your hair first as usual with your  normal  shampoo.  3.  After you shampoo, rinse your hair and body thoroughly to remove the  shampoo.                           4.  Use CHG as you would any other liquid soap.  You can apply chg directly  to the skin and wash                       Gently with a scrungie or clean washcloth.  5.  Apply the CHG Soap to your body ONLY FROM THE NECK DOWN.   Do not use on face/ open  Wound or open sores. Avoid contact with eyes, ears mouth and genitals (private parts).                       Wash face,  Genitals (private parts) with your normal soap.             6.  Wash thoroughly, paying special attention to the area where your surgery  will be performed.  7.  Thoroughly rinse your body with warm water from the neck down.  8.  DO NOT shower/wash with your normal soap after using and rinsing off  the CHG Soap.                9.  Pat yourself dry with a clean towel.            10.  Wear clean pajamas.            11.  Place clean sheets on your bed the night of your first shower and do not  sleep with pets. Day of Surgery : Do not apply any lotions/deodorants the morning of surgery.  Please wear clean clothes to the hospital/surgery center.  FAILURE TO FOLLOW THESE INSTRUCTIONS MAY RESULT IN THE CANCELLATION OF YOUR SURGERY PATIENT SIGNATURE_________________________________  NURSE SIGNATURE__________________________________  ________________________________________________________________________   Adam Phenix  An incentive spirometer is a tool that can help keep your lungs clear and active. This tool measures how well you are filling your lungs with each breath. Taking long deep breaths may help reverse or decrease the chance of developing breathing (pulmonary) problems (especially infection) following:  A long  period of time when you are unable to move or be active. BEFORE THE PROCEDURE   If the spirometer includes an indicator to show your best effort, your nurse or respiratory therapist will set it to a desired goal.  If possible, sit up straight or lean slightly forward. Try not to slouch.  Hold the incentive spirometer in an upright position. INSTRUCTIONS FOR USE  1. Sit on the edge of your bed if possible, or sit up as far as you can in bed or on a chair. 2. Hold the incentive spirometer in an upright position. 3. Breathe out normally. 4. Place the mouthpiece in your mouth and seal your lips tightly around it. 5. Breathe in slowly and as deeply as possible, raising the piston or the ball toward the top of the column. 6. Hold your breath for 3-5 seconds or for as long as possible. Allow the piston or ball to fall to the bottom of the column. 7. Remove the mouthpiece from your mouth and breathe out normally. 8. Rest for a few seconds and repeat Steps 1 through 7 at least 10 times every 1-2 hours when you are awake. Take your time and take a few normal breaths between deep breaths. 9. The spirometer may include an indicator to show your best effort. Use the indicator as a goal to work toward during each repetition. 10. After each set of 10 deep breaths, practice coughing to be sure your lungs are clear. If you have an incision (the cut made at the time of surgery), support your incision when coughing by placing a pillow or rolled up towels firmly against it. Once you are able to get out of bed, walk around indoors and cough well. You may stop using the incentive spirometer when instructed by your caregiver.  RISKS AND COMPLICATIONS  Take your time so you do not get  dizzy or light-headed.  If you are in pain, you may need to take or ask for pain medication before doing incentive spirometry. It is harder to take a deep breath if you are having pain. AFTER USE  Rest and breathe slowly and  easily.  It can be helpful to keep track of a log of your progress. Your caregiver can provide you with a simple table to help with this. If you are using the spirometer at home, follow these instructions: [place] IF:   You are having difficultly using the spirometer.  You have trouble using the spirometer as often as instructed.  Your pain medication is not giving enough relief while using the spirometer.  You develop fever of 100.5 F (38.1 C) or higher. SEEK IMMEDIATE MEDICAL CARE IF:   You cough up bloody sputum that had not been present before.  You develop fever of 102 F (38.9 C) or greater.  You develop worsening pain at or near the incision site. MAKE SURE YOU:   Understand these instructions.  Will watch your condition.  Will get help right away if you are not doing well or get worse. Document Released: 02/12/2007 Document Revised: 12/25/2011 Document Reviewed: 04/15/2007 [facility] Patient Information 2014 [place], [place].   ________________________________________________________________________

## 2017-11-28 ENCOUNTER — Other Ambulatory Visit: Payer: Self-pay

## 2017-11-28 ENCOUNTER — Encounter (HOSPITAL_COMMUNITY)
Admission: RE | Admit: 2017-11-28 | Discharge: 2017-11-28 | Disposition: A | Discharge status: Home / Self Care | Payer: Medicare Other | Source: Ambulatory Visit | Attending: Orthopaedic Surgery | Admitting: Orthopaedic Surgery

## 2017-11-28 ENCOUNTER — Encounter (HOSPITAL_COMMUNITY): Payer: Self-pay

## 2017-11-28 ENCOUNTER — Ambulatory Visit (INDEPENDENT_AMBULATORY_CARE_PROVIDER_SITE_OTHER): Payer: Medicare Other | Admitting: *Deleted

## 2017-11-28 ENCOUNTER — Other Ambulatory Visit (HOSPITAL_COMMUNITY): Payer: Self-pay | Admitting: *Deleted

## 2017-11-28 DIAGNOSIS — Z5181 Encounter for therapeutic drug level monitoring: Secondary | ICD-10-CM | POA: Diagnosis not present

## 2017-11-28 DIAGNOSIS — I4891 Unspecified atrial fibrillation: Secondary | ICD-10-CM | POA: Diagnosis not present

## 2017-11-28 DIAGNOSIS — Z7901 Long term (current) use of anticoagulants: Secondary | ICD-10-CM

## 2017-11-28 DIAGNOSIS — Z01812 Encounter for preprocedural laboratory examination: Secondary | ICD-10-CM | POA: Diagnosis not present

## 2017-11-28 HISTORY — DX: Headache, unspecified: R51.9

## 2017-11-28 HISTORY — DX: Long term (current) use of anticoagulants: Z79.01

## 2017-11-28 HISTORY — DX: Personal history of adenomatous and serrated colon polyps: Z86.0101

## 2017-11-28 HISTORY — DX: Gastro-esophageal reflux disease without esophagitis: K21.9

## 2017-11-28 HISTORY — DX: Cardiac arrhythmia, unspecified: I49.9

## 2017-11-28 HISTORY — DX: Pneumonia, unspecified organism: J18.9

## 2017-11-28 HISTORY — DX: Diaphragmatic hernia without obstruction or gangrene: K44.9

## 2017-11-28 HISTORY — DX: Nonrheumatic aortic (valve) stenosis: I35.0

## 2017-11-28 HISTORY — DX: Personal history of other diseases of the digestive system: Z87.19

## 2017-11-28 HISTORY — DX: Headache: R51

## 2017-11-28 HISTORY — DX: Unspecified hearing loss, unspecified ear: H91.90

## 2017-11-28 HISTORY — DX: Chronic diastolic (congestive) heart failure: I50.32

## 2017-11-28 HISTORY — DX: Personal history of colonic polyps: Z86.010

## 2017-11-28 HISTORY — DX: Other persistent atrial fibrillation: I48.19

## 2017-11-28 HISTORY — DX: Unspecified osteoarthritis, unspecified site: M19.90

## 2017-11-28 LAB — CBC
HEMATOCRIT: 44.6 % (ref 39.0–52.0)
Hemoglobin: 15.4 g/dL (ref 13.0–17.0)
MCH: 33 pg (ref 26.0–34.0)
MCHC: 34.5 g/dL (ref 30.0–36.0)
MCV: 95.7 fL (ref 78.0–100.0)
Platelets: 194 10*3/uL (ref 150–400)
RBC: 4.66 MIL/uL (ref 4.22–5.81)
RDW: 12.6 % (ref 11.5–15.5)
WBC: 8.1 10*3/uL (ref 4.0–10.5)

## 2017-11-28 LAB — BASIC METABOLIC PANEL
ANION GAP: 10 (ref 5–15)
BUN: 15 mg/dL (ref 6–20)
CHLORIDE: 105 mmol/L (ref 101–111)
CO2: 27 mmol/L (ref 22–32)
Calcium: 8.8 mg/dL — ABNORMAL LOW (ref 8.9–10.3)
Creatinine, Ser: 1.09 mg/dL (ref 0.61–1.24)
GFR calc Af Amer: >60 mL/min (ref >60)
GFR calc non Af Amer: >60 mL/min (ref >60)
GLUCOSE: 105 mg/dL — AB (ref 65–99)
POTASSIUM: 4 mmol/L (ref 3.5–5.1)
Sodium: 142 mmol/L (ref 135–145)

## 2017-11-28 LAB — POCT INR: INR: 2.4

## 2017-11-28 LAB — SURGICAL PCR SCREEN
MRSA, PCR: NEGATIVE
STAPHYLOCOCCUS AUREUS: POSITIVE — AB

## 2017-11-28 NOTE — Progress Notes (Signed)
Cardiac clearance note brittany strader pa and ekg 11-20-17 epic Echo 11-20-17 epic

## 2017-11-28 NOTE — Patient Instructions (Signed)
Continue coumadin 1/2 tablet daily except 1 tablet on Mondays, Wednesdays and Fridays Pending TKR by Dr Rush Farmer on 12/07/17.  Will hold coumadin 5 days before procedure.  Take last dose of coumadin on Saturday night (12/01/17)  Resume coumadin after surgery per Dr Pryor Montes instructions. Recheck INR 2 wks after surgery in office.  If home health is ordered they can check your coumadin.  Call and let me know.

## 2017-11-30 ENCOUNTER — Telehealth (INDEPENDENT_AMBULATORY_CARE_PROVIDER_SITE_OTHER): Payer: Self-pay | Admitting: Orthopaedic Surgery

## 2017-11-30 DIAGNOSIS — N400 Enlarged prostate without lower urinary tract symptoms: Secondary | ICD-10-CM | POA: Diagnosis not present

## 2017-11-30 DIAGNOSIS — Z6836 Body mass index (BMI) 36.0-36.9, adult: Secondary | ICD-10-CM | POA: Diagnosis not present

## 2017-11-30 DIAGNOSIS — M1991 Primary osteoarthritis, unspecified site: Secondary | ICD-10-CM | POA: Diagnosis not present

## 2017-11-30 DIAGNOSIS — M5030 Other cervical disc degeneration, unspecified cervical region: Secondary | ICD-10-CM | POA: Diagnosis not present

## 2017-11-30 NOTE — Telephone Encounter (Signed)
Daughter aware of the below

## 2017-11-30 NOTE — Telephone Encounter (Signed)
Correct. Those get set up while he is in the hospital and delivered to home.

## 2017-11-30 NOTE — Telephone Encounter (Signed)
This is all done by the case manager at the hospital when he is there, correct?

## 2017-11-30 NOTE — Telephone Encounter (Signed)
The patient's daughter, Hart Rochester, called stating that they went to a class last night in preporation for his surgery and became aware of a couple items that he does not have for when he comes home.  Those items are: a walker, a bedside commode, a knee immobilizer, and the ted hose.  She was wanting to go ahead and get a prescription for those items.  Also his appointment is two weeks after surgery and was told he can not take a shower until the staples come out, do they need to schedule an earlier appointment. [phone].  Thank you.

## 2017-12-06 MED ORDER — TRANEXAMIC ACID 1000 MG/10ML IV SOLN
1000.0000 mg | INTRAVENOUS | Status: AC
  Administered 2017-12-07: 1000 mg via INTRAVENOUS
  Filled 2017-12-06: qty 1100

## 2017-12-06 MED ORDER — DEXTROSE 5 % IV SOLN
3.0000 g | INTRAVENOUS | Status: AC
  Administered 2017-12-07: 3 g via INTRAVENOUS
  Filled 2017-12-06: qty 3

## 2017-12-07 ENCOUNTER — Encounter (HOSPITAL_COMMUNITY): Payer: Self-pay | Admitting: *Deleted

## 2017-12-07 ENCOUNTER — Other Ambulatory Visit: Payer: Self-pay

## 2017-12-07 ENCOUNTER — Inpatient Hospital Stay (HOSPITAL_COMMUNITY): Payer: Medicare Other

## 2017-12-07 ENCOUNTER — Encounter (HOSPITAL_COMMUNITY)
Admission: RE | Disposition: A | Discharge status: Home Health | Payer: Self-pay | Source: Ambulatory Visit | Attending: Orthopaedic Surgery

## 2017-12-07 ENCOUNTER — Inpatient Hospital Stay (HOSPITAL_COMMUNITY)
Admission: RE | Admit: 2017-12-07 | Discharge: 2017-12-13 | DRG: 470 | Disposition: A | Discharge status: Home Health | Payer: Medicare Other | Source: Ambulatory Visit | Attending: Orthopaedic Surgery | Admitting: Orthopaedic Surgery

## 2017-12-07 ENCOUNTER — Inpatient Hospital Stay (HOSPITAL_COMMUNITY): Payer: Medicare Other | Admitting: Anesthesiology

## 2017-12-07 DIAGNOSIS — Z86010 Personal history of colonic polyps: Secondary | ICD-10-CM

## 2017-12-07 DIAGNOSIS — G4730 Sleep apnea, unspecified: Secondary | ICD-10-CM | POA: Diagnosis not present

## 2017-12-07 DIAGNOSIS — R000 Tachycardia, unspecified: Secondary | ICD-10-CM | POA: Diagnosis not present

## 2017-12-07 DIAGNOSIS — K222 Esophageal obstruction: Secondary | ICD-10-CM

## 2017-12-07 DIAGNOSIS — K219 Gastro-esophageal reflux disease without esophagitis: Secondary | ICD-10-CM | POA: Diagnosis not present

## 2017-12-07 DIAGNOSIS — I361 Nonrheumatic tricuspid (valve) insufficiency: Secondary | ICD-10-CM | POA: Diagnosis not present

## 2017-12-07 DIAGNOSIS — R0602 Shortness of breath: Secondary | ICD-10-CM | POA: Diagnosis not present

## 2017-12-07 DIAGNOSIS — R42 Dizziness and giddiness: Secondary | ICD-10-CM | POA: Diagnosis not present

## 2017-12-07 DIAGNOSIS — I482 Chronic atrial fibrillation: Secondary | ICD-10-CM | POA: Diagnosis present

## 2017-12-07 DIAGNOSIS — I130 Hypertensive heart and chronic kidney disease with heart failure and stage 1 through stage 4 chronic kidney disease, or unspecified chronic kidney disease: Secondary | ICD-10-CM | POA: Diagnosis not present

## 2017-12-07 DIAGNOSIS — R1310 Dysphagia, unspecified: Secondary | ICD-10-CM | POA: Diagnosis not present

## 2017-12-07 DIAGNOSIS — R062 Wheezing: Secondary | ICD-10-CM

## 2017-12-07 DIAGNOSIS — Z6835 Body mass index (BMI) 35.0-35.9, adult: Secondary | ICD-10-CM | POA: Diagnosis not present

## 2017-12-07 DIAGNOSIS — Z96651 Presence of right artificial knee joint: Secondary | ICD-10-CM

## 2017-12-07 DIAGNOSIS — E669 Obesity, unspecified: Secondary | ICD-10-CM | POA: Diagnosis present

## 2017-12-07 DIAGNOSIS — M170 Bilateral primary osteoarthritis of knee: Secondary | ICD-10-CM | POA: Diagnosis not present

## 2017-12-07 DIAGNOSIS — R112 Nausea with vomiting, unspecified: Secondary | ICD-10-CM | POA: Diagnosis not present

## 2017-12-07 DIAGNOSIS — K210 Gastro-esophageal reflux disease with esophagitis: Secondary | ICD-10-CM | POA: Diagnosis present

## 2017-12-07 DIAGNOSIS — R51 Headache: Secondary | ICD-10-CM | POA: Diagnosis present

## 2017-12-07 DIAGNOSIS — K224 Dyskinesia of esophagus: Secondary | ICD-10-CM | POA: Diagnosis not present

## 2017-12-07 DIAGNOSIS — H9193 Unspecified hearing loss, bilateral: Secondary | ICD-10-CM | POA: Diagnosis not present

## 2017-12-07 DIAGNOSIS — M1711 Unilateral primary osteoarthritis, right knee: Secondary | ICD-10-CM | POA: Diagnosis not present

## 2017-12-07 DIAGNOSIS — E785 Hyperlipidemia, unspecified: Secondary | ICD-10-CM | POA: Diagnosis not present

## 2017-12-07 DIAGNOSIS — N189 Chronic kidney disease, unspecified: Secondary | ICD-10-CM | POA: Diagnosis not present

## 2017-12-07 DIAGNOSIS — K449 Diaphragmatic hernia without obstruction or gangrene: Secondary | ICD-10-CM | POA: Diagnosis present

## 2017-12-07 DIAGNOSIS — I4891 Unspecified atrial fibrillation: Secondary | ICD-10-CM | POA: Diagnosis not present

## 2017-12-07 DIAGNOSIS — R0989 Other specified symptoms and signs involving the circulatory and respiratory systems: Secondary | ICD-10-CM | POA: Diagnosis not present

## 2017-12-07 DIAGNOSIS — I4510 Unspecified right bundle-branch block: Secondary | ICD-10-CM | POA: Diagnosis not present

## 2017-12-07 DIAGNOSIS — M25461 Effusion, right knee: Secondary | ICD-10-CM | POA: Diagnosis not present

## 2017-12-07 DIAGNOSIS — I350 Nonrheumatic aortic (valve) stenosis: Secondary | ICD-10-CM | POA: Diagnosis not present

## 2017-12-07 DIAGNOSIS — Z7901 Long term (current) use of anticoagulants: Secondary | ICD-10-CM

## 2017-12-07 DIAGNOSIS — I5032 Chronic diastolic (congestive) heart failure: Secondary | ICD-10-CM | POA: Diagnosis present

## 2017-12-07 DIAGNOSIS — M7989 Other specified soft tissue disorders: Secondary | ICD-10-CM | POA: Diagnosis not present

## 2017-12-07 DIAGNOSIS — I5022 Chronic systolic (congestive) heart failure: Secondary | ICD-10-CM | POA: Diagnosis not present

## 2017-12-07 DIAGNOSIS — Z471 Aftercare following joint replacement surgery: Secondary | ICD-10-CM | POA: Diagnosis not present

## 2017-12-07 DIAGNOSIS — M2010 Hallux valgus (acquired), unspecified foot: Secondary | ICD-10-CM | POA: Diagnosis not present

## 2017-12-07 DIAGNOSIS — M25561 Pain in right knee: Secondary | ICD-10-CM | POA: Diagnosis present

## 2017-12-07 DIAGNOSIS — R269 Unspecified abnormalities of gait and mobility: Secondary | ICD-10-CM | POA: Diagnosis not present

## 2017-12-07 DIAGNOSIS — I5031 Acute diastolic (congestive) heart failure: Secondary | ICD-10-CM | POA: Diagnosis not present

## 2017-12-07 DIAGNOSIS — G8918 Other acute postprocedural pain: Secondary | ICD-10-CM | POA: Diagnosis not present

## 2017-12-07 HISTORY — PX: TOTAL KNEE ARTHROPLASTY: SHX125

## 2017-12-07 LAB — PROTIME-INR
INR: 1.16
Prothrombin Time: 14.7 seconds (ref 11.4–15.2)

## 2017-12-07 LAB — APTT: aPTT: 31 seconds (ref 24–36)

## 2017-12-07 SURGERY — ARTHROPLASTY, KNEE, TOTAL
Anesthesia: General | Site: Knee | Laterality: Right

## 2017-12-07 MED ORDER — ONDANSETRON HCL 4 MG/2ML IJ SOLN: 4.0000 mg | Freq: Four times a day (QID) | INTRAMUSCULAR | Status: DC | PRN

## 2017-12-07 MED ORDER — ROCURONIUM BROMIDE 10 MG/ML (PF) SYRINGE
PREFILLED_SYRINGE | INTRAVENOUS | Status: DC | PRN
  Administered 2017-12-07: 40 mg via INTRAVENOUS
  Administered 2017-12-07: 20 mg via INTRAVENOUS

## 2017-12-07 MED ORDER — SUGAMMADEX SODIUM 500 MG/5ML IV SOLN
INTRAVENOUS | Status: AC
  Filled 2017-12-07: qty 5

## 2017-12-07 MED ORDER — ATORVASTATIN CALCIUM 10 MG PO TABS
10.0000 mg | ORAL_TABLET | Freq: Every day | ORAL | Status: DC
  Administered 2017-12-07 – 2017-12-12 (×6): 10 mg via ORAL
  Filled 2017-12-07 (×6): qty 1

## 2017-12-07 MED ORDER — ACETAMINOPHEN 650 MG RE SUPP
650.0000 mg | RECTAL | Status: DC | PRN
Start: 2017-12-07 — End: 2017-12-13

## 2017-12-07 MED ORDER — LOSARTAN POTASSIUM 50 MG PO TABS
100.0000 mg | ORAL_TABLET | Freq: Every day | ORAL | Status: DC
  Administered 2017-12-07 – 2017-12-13 (×7): 100 mg via ORAL
  Filled 2017-12-07 (×7): qty 2

## 2017-12-07 MED ORDER — DEXAMETHASONE SODIUM PHOSPHATE 10 MG/ML IJ SOLN
INTRAMUSCULAR | Status: DC | PRN
  Administered 2017-12-07: 10 mg via INTRAVENOUS

## 2017-12-07 MED ORDER — WARFARIN - PHARMACIST DOSING INPATIENT: Freq: Every day | Status: DC

## 2017-12-07 MED ORDER — FENTANYL CITRATE (PF) 100 MCG/2ML IJ SOLN
25.0000 ug | INTRAMUSCULAR | Status: DC | PRN
  Administered 2017-12-07 (×2): 50 ug via INTRAVENOUS
  Administered 2017-12-07: 25 ug via INTRAVENOUS

## 2017-12-07 MED ORDER — PROMETHAZINE HCL 25 MG/ML IJ SOLN: 6.2500 mg | INTRAMUSCULAR | Status: DC | PRN

## 2017-12-07 MED ORDER — ONDANSETRON HCL 4 MG/2ML IJ SOLN
INTRAMUSCULAR | Status: AC
  Filled 2017-12-07: qty 2

## 2017-12-07 MED ORDER — FENTANYL CITRATE (PF) 100 MCG/2ML IJ SOLN
50.0000 ug | INTRAMUSCULAR | Status: DC
  Administered 2017-12-07: 25 ug via INTRAVENOUS
  Filled 2017-12-07: qty 2

## 2017-12-07 MED ORDER — MEPERIDINE HCL 50 MG/ML IJ SOLN: 6.2500 mg | INTRAMUSCULAR | Status: DC | PRN

## 2017-12-07 MED ORDER — FENTANYL CITRATE (PF) 100 MCG/2ML IJ SOLN
INTRAMUSCULAR | Status: AC
  Administered 2017-12-07: 50 ug via INTRAVENOUS
  Filled 2017-12-07: qty 4

## 2017-12-07 MED ORDER — BUPIVACAINE LIPOSOME 1.3 % IJ SUSP
20.0000 mL | Freq: Once | INTRAMUSCULAR | Status: AC
  Administered 2017-12-07: 20 mL
  Filled 2017-12-07: qty 20

## 2017-12-07 MED ORDER — ALUM & MAG HYDROXIDE-SIMETH 200-200-20 MG/5ML PO SUSP
30.0000 mL | ORAL | Status: DC | PRN
  Administered 2017-12-08: 30 mL via ORAL
  Filled 2017-12-07 (×2): qty 30

## 2017-12-07 MED ORDER — CHLORHEXIDINE GLUCONATE 4 % EX LIQD: 60.0000 mL | Freq: Once | CUTANEOUS | Status: DC

## 2017-12-07 MED ORDER — PANTOPRAZOLE SODIUM 40 MG PO TBEC
80.0000 mg | DELAYED_RELEASE_TABLET | Freq: Every day | ORAL | Status: DC
  Administered 2017-12-07 – 2017-12-13 (×7): 80 mg via ORAL
  Filled 2017-12-07 (×7): qty 2

## 2017-12-07 MED ORDER — LIDOCAINE 2% (20 MG/ML) 5 ML SYRINGE
INTRAMUSCULAR | Status: AC
  Filled 2017-12-07: qty 5

## 2017-12-07 MED ORDER — POLYETHYLENE GLYCOL 3350 17 G PO PACK
17.0000 g | PACK | Freq: Every day | ORAL | Status: DC | PRN
  Administered 2017-12-12: 17 g via ORAL
  Filled 2017-12-07: qty 1

## 2017-12-07 MED ORDER — ACETAMINOPHEN 325 MG PO TABS
650.0000 mg | ORAL_TABLET | ORAL | Status: DC | PRN
  Administered 2017-12-10: 650 mg via ORAL
  Filled 2017-12-07: qty 2

## 2017-12-07 MED ORDER — FENTANYL CITRATE (PF) 100 MCG/2ML IJ SOLN
25.0000 ug | INTRAMUSCULAR | Status: DC | PRN
  Administered 2017-12-07 (×3): 25 ug via INTRAVENOUS

## 2017-12-07 MED ORDER — METHOCARBAMOL 500 MG PO TABS
500.0000 mg | ORAL_TABLET | Freq: Four times a day (QID) | ORAL | Status: DC | PRN
  Administered 2017-12-07 – 2017-12-13 (×9): 500 mg via ORAL
  Filled 2017-12-07 (×9): qty 1

## 2017-12-07 MED ORDER — FENTANYL CITRATE (PF) 100 MCG/2ML IJ SOLN
INTRAMUSCULAR | Status: DC | PRN
  Administered 2017-12-07 (×2): 50 ug via INTRAVENOUS
  Administered 2017-12-07 (×2): 25 ug via INTRAVENOUS
  Administered 2017-12-07: 75 ug via INTRAVENOUS
  Administered 2017-12-07: 25 ug via INTRAVENOUS

## 2017-12-07 MED ORDER — 0.9 % SODIUM CHLORIDE (POUR BTL) OPTIME
TOPICAL | Status: DC | PRN
  Administered 2017-12-07: 1000 mL

## 2017-12-07 MED ORDER — SODIUM CHLORIDE 0.9 % IV SOLN
INTRAVENOUS | Status: DC
  Administered 2017-12-07 – 2017-12-10 (×4): via INTRAVENOUS

## 2017-12-07 MED ORDER — ROCURONIUM BROMIDE 10 MG/ML (PF) SYRINGE
PREFILLED_SYRINGE | INTRAVENOUS | Status: AC
Start: 2017-12-07 — End: 2017-12-07
  Filled 2017-12-07: qty 5

## 2017-12-07 MED ORDER — HYDROCODONE-ACETAMINOPHEN 5-325 MG PO TABS
1.0000 | ORAL_TABLET | ORAL | Status: DC | PRN
  Administered 2017-12-07: 1 via ORAL
  Administered 2017-12-07: 2 via ORAL
  Administered 2017-12-07: 1 via ORAL
  Administered 2017-12-08 – 2017-12-12 (×13): 2 via ORAL
  Filled 2017-12-07 (×3): qty 2
  Filled 2017-12-07: qty 1
  Filled 2017-12-07 (×11): qty 2
  Filled 2017-12-07: qty 1

## 2017-12-07 MED ORDER — HYDROMORPHONE HCL 1 MG/ML IJ SOLN
1.0000 mg | INTRAMUSCULAR | Status: DC | PRN
  Administered 2017-12-07 – 2017-12-08 (×6): 1 mg via INTRAVENOUS
  Filled 2017-12-07 (×6): qty 1

## 2017-12-07 MED ORDER — LACTATED RINGERS IV SOLN
INTRAVENOUS | Status: DC
Start: 2017-12-07 — End: 2017-12-07
  Administered 2017-12-07 (×2): via INTRAVENOUS

## 2017-12-07 MED ORDER — METOCLOPRAMIDE HCL 5 MG/ML IJ SOLN: 5.0000 mg | Freq: Three times a day (TID) | INTRAMUSCULAR | Status: DC | PRN

## 2017-12-07 MED ORDER — SODIUM CHLORIDE 0.9 % IR SOLN
Status: DC | PRN
  Administered 2017-12-07: 3000 mL

## 2017-12-07 MED ORDER — OXYCODONE HCL 5 MG PO TABS
10.0000 mg | ORAL_TABLET | ORAL | Status: DC | PRN
  Administered 2017-12-08 – 2017-12-11 (×15): 10 mg via ORAL
  Filled 2017-12-07 (×15): qty 2

## 2017-12-07 MED ORDER — MENTHOL 3 MG MT LOZG: 1.0000 | LOZENGE | OROMUCOSAL | Status: DC | PRN

## 2017-12-07 MED ORDER — PHENYLEPHRINE HCL 10 MG/ML IJ SOLN
INTRAVENOUS | Status: DC | PRN
  Administered 2017-12-07: 40 ug/min via INTRAVENOUS

## 2017-12-07 MED ORDER — LABETALOL HCL 5 MG/ML IV SOLN: 5.0000 mg | INTRAVENOUS | Status: DC | PRN

## 2017-12-07 MED ORDER — FUROSEMIDE 40 MG PO TABS
40.0000 mg | ORAL_TABLET | Freq: Every day | ORAL | Status: DC
  Administered 2017-12-07 – 2017-12-09 (×3): 40 mg via ORAL
  Filled 2017-12-07 (×6): qty 1

## 2017-12-07 MED ORDER — DEXAMETHASONE SODIUM PHOSPHATE 10 MG/ML IJ SOLN
INTRAMUSCULAR | Status: AC
  Filled 2017-12-07: qty 1

## 2017-12-07 MED ORDER — PROPOFOL 10 MG/ML IV BOLUS
INTRAVENOUS | Status: AC
  Filled 2017-12-07: qty 20

## 2017-12-07 MED ORDER — DIPHENHYDRAMINE HCL 12.5 MG/5ML PO ELIX
12.5000 mg | ORAL_SOLUTION | ORAL | Status: DC | PRN
  Administered 2017-12-12: 25 mg via ORAL
  Filled 2017-12-07: qty 10

## 2017-12-07 MED ORDER — CARVEDILOL 3.125 MG PO TABS
3.1250 mg | ORAL_TABLET | Freq: Two times a day (BID) | ORAL | Status: DC
  Administered 2017-12-07 – 2017-12-13 (×12): 3.125 mg via ORAL
  Filled 2017-12-07 (×12): qty 1

## 2017-12-07 MED ORDER — ACETAMINOPHEN 10 MG/ML IV SOLN
INTRAVENOUS | Status: AC
  Filled 2017-12-07: qty 100

## 2017-12-07 MED ORDER — LACTATED RINGERS IV SOLN: INTRAVENOUS | Status: DC

## 2017-12-07 MED ORDER — METOCLOPRAMIDE HCL 5 MG PO TABS: 5.0000 mg | ORAL_TABLET | Freq: Three times a day (TID) | ORAL | Status: DC | PRN

## 2017-12-07 MED ORDER — MIDAZOLAM HCL 2 MG/2ML IJ SOLN
1.0000 mg | INTRAMUSCULAR | Status: DC
  Administered 2017-12-07: 1 mg via INTRAVENOUS
  Filled 2017-12-07: qty 2

## 2017-12-07 MED ORDER — DOCUSATE SODIUM 100 MG PO CAPS
100.0000 mg | ORAL_CAPSULE | Freq: Two times a day (BID) | ORAL | Status: DC
  Administered 2017-12-07 – 2017-12-13 (×13): 100 mg via ORAL
  Filled 2017-12-07 (×13): qty 1

## 2017-12-07 MED ORDER — WARFARIN SODIUM 5 MG PO TABS
5.0000 mg | ORAL_TABLET | Freq: Once | ORAL | Status: AC
  Administered 2017-12-07: 5 mg via ORAL
  Filled 2017-12-07: qty 1

## 2017-12-07 MED ORDER — PHENOL 1.4 % MT LIQD: 1.0000 | OROMUCOSAL | Status: DC | PRN

## 2017-12-07 MED ORDER — ONDANSETRON HCL 4 MG/2ML IJ SOLN
INTRAMUSCULAR | Status: DC | PRN
  Administered 2017-12-07: 4 mg via INTRAVENOUS

## 2017-12-07 MED ORDER — METHOCARBAMOL 1000 MG/10ML IJ SOLN
500.0000 mg | Freq: Four times a day (QID) | INTRAVENOUS | Status: DC | PRN
  Administered 2017-12-07: 500 mg via INTRAVENOUS
  Filled 2017-12-07: qty 550

## 2017-12-07 MED ORDER — STERILE WATER FOR IRRIGATION IR SOLN
Status: DC | PRN
  Administered 2017-12-07: 2000 mL

## 2017-12-07 MED ORDER — ONDANSETRON HCL 4 MG PO TABS: 4.0000 mg | ORAL_TABLET | Freq: Four times a day (QID) | ORAL | Status: DC | PRN

## 2017-12-07 MED ORDER — ROPIVACAINE HCL 7.5 MG/ML IJ SOLN
INTRAMUSCULAR | Status: DC | PRN
  Administered 2017-12-07: 20 mL via PERINEURAL

## 2017-12-07 MED ORDER — ACETAMINOPHEN 10 MG/ML IV SOLN
INTRAVENOUS | Status: DC | PRN
  Administered 2017-12-07: 1000 mg via INTRAVENOUS

## 2017-12-07 MED ORDER — FENTANYL CITRATE (PF) 250 MCG/5ML IJ SOLN
INTRAMUSCULAR | Status: AC
  Filled 2017-12-07: qty 5

## 2017-12-07 MED ORDER — CEFAZOLIN SODIUM-DEXTROSE 1-4 GM/50ML-% IV SOLN
1.0000 g | Freq: Four times a day (QID) | INTRAVENOUS | Status: AC
  Administered 2017-12-07 (×2): 1 g via INTRAVENOUS
  Filled 2017-12-07 (×2): qty 50

## 2017-12-07 MED ORDER — FERROUS SULFATE 325 (65 FE) MG PO TABS
325.0000 mg | ORAL_TABLET | Freq: Every day | ORAL | Status: DC
  Administered 2017-12-08 – 2017-12-13 (×5): 325 mg via ORAL
  Filled 2017-12-07 (×6): qty 1

## 2017-12-07 MED ORDER — SODIUM CHLORIDE 0.9 % IJ SOLN
INTRAMUSCULAR | Status: AC
  Filled 2017-12-07: qty 50

## 2017-12-07 MED ORDER — PROPOFOL 10 MG/ML IV BOLUS
INTRAVENOUS | Status: DC | PRN
  Administered 2017-12-07: 120 mg via INTRAVENOUS

## 2017-12-07 MED ORDER — SUCCINYLCHOLINE CHLORIDE 200 MG/10ML IV SOSY
PREFILLED_SYRINGE | INTRAVENOUS | Status: DC | PRN
  Administered 2017-12-07: 140 mg via INTRAVENOUS

## 2017-12-07 MED ORDER — SUCCINYLCHOLINE CHLORIDE 200 MG/10ML IV SOSY
PREFILLED_SYRINGE | INTRAVENOUS | Status: AC
  Filled 2017-12-07: qty 10

## 2017-12-07 MED ORDER — LIDOCAINE 2% (20 MG/ML) 5 ML SYRINGE
INTRAMUSCULAR | Status: DC | PRN
  Administered 2017-12-07: 50 mg via INTRAVENOUS

## 2017-12-07 MED ORDER — SODIUM CHLORIDE 0.9 % IJ SOLN
INTRAMUSCULAR | Status: DC | PRN
  Administered 2017-12-07: 40 mL

## 2017-12-07 MED ORDER — FLUTICASONE PROPIONATE 50 MCG/ACT NA SUSP
1.0000 | Freq: Every day | NASAL | Status: DC
  Administered 2017-12-07 – 2017-12-13 (×5): 1 via NASAL
  Filled 2017-12-07: qty 16

## 2017-12-07 MED ORDER — SUGAMMADEX SODIUM 200 MG/2ML IV SOLN
INTRAVENOUS | Status: DC | PRN
  Administered 2017-12-07: 300 mg via INTRAVENOUS

## 2017-12-07 SURGICAL SUPPLY — 47 items
APL SKNCLS STERI-STRIP NONHPOA (GAUZE/BANDAGES/DRESSINGS) ×1
BAG SPEC THK2 15X12 ZIP CLS (MISCELLANEOUS)
BAG ZIPLOCK 12X15 (MISCELLANEOUS) IMPLANT
BANDAGE ACE 6X5 VEL STRL LF (GAUZE/BANDAGES/DRESSINGS) ×2 IMPLANT
BENZOIN TINCTURE PRP APPL 2/3 (GAUZE/BANDAGES/DRESSINGS) ×1 IMPLANT
BLADE SAG 18X100X1.27 (BLADE) ×1 IMPLANT
BOWL SMART MIX CTS (DISPOSABLE) ×1 IMPLANT
CAPT KNEE TOTAL 3 ×1 IMPLANT
CEMENT BONE SIMPLEX SPEEDSET (Cement) ×2 IMPLANT
COVER SURGICAL LIGHT HANDLE (MISCELLANEOUS) ×2 IMPLANT
CUFF TOURN SGL QUICK 34 (TOURNIQUET CUFF) ×2
CUFF TRNQT CYL 34X4X40X1 (TOURNIQUET CUFF) ×1 IMPLANT
DRAPE U-SHAPE 47X51 STRL (DRAPES) ×2 IMPLANT
DRSG PAD ABDOMINAL 8X10 ST (GAUZE/BANDAGES/DRESSINGS) ×2 IMPLANT
DURAPREP 26ML APPLICATOR (WOUND CARE) ×2 IMPLANT
ELECT REM PT RETURN 15FT ADLT (MISCELLANEOUS) ×2 IMPLANT
GAUZE SPONGE 4X4 12PLY STRL (GAUZE/BANDAGES/DRESSINGS) ×2 IMPLANT
GAUZE XEROFORM 1X8 LF (GAUZE/BANDAGES/DRESSINGS) IMPLANT
GLOVE BIO SURGEON STRL SZ7.5 (GLOVE) ×2 IMPLANT
GLOVE BIOGEL PI IND STRL 8 (GLOVE) ×2 IMPLANT
GLOVE BIOGEL PI INDICATOR 8 (GLOVE) ×2
GLOVE ECLIPSE 8.0 STRL XLNG CF (GLOVE) ×2 IMPLANT
GOWN STRL REUS W/TWL XL LVL3 (GOWN DISPOSABLE) ×4 IMPLANT
HANDPIECE INTERPULSE COAX TIP (DISPOSABLE) ×2
IMMOBILIZER KNEE 20 (SOFTGOODS) ×2
IMMOBILIZER KNEE 20 THIGH 36 (SOFTGOODS) ×1 IMPLANT
NS IRRIG 1000ML POUR BTL (IV SOLUTION) ×2 IMPLANT
PACK TOTAL KNEE CUSTOM (KITS) ×2 IMPLANT
PAD ABD 8X10 STRL (GAUZE/BANDAGES/DRESSINGS) ×1 IMPLANT
PADDING CAST COTTON 6X4 STRL (CAST SUPPLIES) ×3 IMPLANT
POSITIONER SURGICAL ARM (MISCELLANEOUS) ×2 IMPLANT
SET HNDPC FAN SPRY TIP SCT (DISPOSABLE) ×1 IMPLANT
SET PAD KNEE POSITIONER (MISCELLANEOUS) ×2 IMPLANT
STAPLER VISISTAT 35W (STAPLE) IMPLANT
STRIP CLOSURE SKIN 1/2X4 (GAUZE/BANDAGES/DRESSINGS) ×1 IMPLANT
SUT MNCRL AB 4-0 PS2 18 (SUTURE) ×1 IMPLANT
SUT VIC AB 0 CT1 27 (SUTURE) ×2
SUT VIC AB 0 CT1 27XBRD ANTBC (SUTURE) ×1 IMPLANT
SUT VIC AB 1 CT1 27 (SUTURE) ×4
SUT VIC AB 1 CT1 27XBRD ANTBC (SUTURE) ×2 IMPLANT
SUT VIC AB 2-0 CT1 27 (SUTURE) ×4
SUT VIC AB 2-0 CT1 TAPERPNT 27 (SUTURE) ×2 IMPLANT
TAPE STRIPS DRAPE STRL (GAUZE/BANDAGES/DRESSINGS) ×1 IMPLANT
TRAY FOLEY W/METER SILVER 16FR (SET/KITS/TRAYS/PACK) ×2 IMPLANT
WATER STERILE IRR 1000ML POUR (IV SOLUTION) ×3 IMPLANT
WRAP KNEE MAXI GEL POST OP (GAUZE/BANDAGES/DRESSINGS) ×2 IMPLANT
YANKAUER SUCT BULB TIP 10FT TU (MISCELLANEOUS) ×2 IMPLANT

## 2017-12-07 NOTE — Progress Notes (Signed)
Assisted Dr. Hollis with right, ultrasound guided, adductor canal block. Side rails up, monitors on throughout procedure. See vital signs in flow sheet. Tolerated Procedure well.  

## 2017-12-07 NOTE — Evaluation (Signed)
Physical Therapy Evaluation Patient Details Name: Brent Mason MRN: [id number] DOB: 03-29-1946 Today's Date: 12/07/2017   History of Present Illness  Pt is a 72 year old male s/p R TKA  Clinical Impression  Pt is s/p TKA resulting in the deficits listed below (see PT Problem List).  Pt will benefit from skilled PT to increase their independence and safety with mobility to allow discharge to the venue listed below.  Pt assisted OOB and able to tolerate short distance ambulation POD #0.  Pt plans to d/c home with spouse.     Follow Up Recommendations Home health PT;Follow surgeon's recommendation for DC plan and follow-up therapies    Equipment Recommendations  Rolling walker with 5" wheels    Recommendations for Other Services       Precautions / Restrictions Precautions Precautions: Knee;Fall Required Braces or Orthoses: Knee Immobilizer - Right Knee Immobilizer - Right: Discontinue once straight leg raise with < 10 degree lag Restrictions Weight Bearing Restrictions: No Other Position/Activity Restrictions: WBAT      Mobility  Bed Mobility Overal bed mobility: Needs Assistance Bed Mobility: Supine to Sit     Supine to sit: HOB elevated;Min assist     General bed mobility comments: assist for R LE  Transfers Overall transfer level: Needs assistance Equipment used: Rolling walker (2 wheeled) Transfers: Sit to/from Stand Sit to Stand: Min assist;From elevated surface         General transfer comment: verbal cues for UE and LE positioning, assist to rise and control descent  Ambulation/Gait Ambulation/Gait assistance: Min guard Ambulation Distance (Feet): 50 Feet Assistive device: Rolling walker (2 wheeled) Gait Pattern/deviations: Decreased stance time - right;Step-to pattern;Antalgic     General Gait Details: verbal cues for sequence, RW positioning, step length, posture, reclining following for safety however pt did not need  Stairs             Wheelchair Mobility    Modified Rankin (Stroke Patients Only)       Balance                                             Pertinent Vitals/Pain Pain Assessment: 0-10 Pain Score: 7  Pain Location: R knee - improved once out of CPM Pain Descriptors / Indicators: Sore;Aching Pain Intervention(s): Limited activity within patient's tolerance;Repositioned;Monitored during session;Ice applied;Premedicated before session  SPO2 98% on room air and HR in 80s once pt seated in recliner after ambulating.    Home Living Family/patient expects to be discharged to:: Private residence Living Arrangements: Spouse/significant other Available Help at Discharge: Family Type of Home: House Home Access: Ramped entrance     [place]: [place]: None      Prior Function Level of Independence: Independent               Hand Dominance        Extremity/Trunk Assessment        Lower Extremity Assessment Lower Extremity Assessment: RLE deficits/detail RLE Deficits / Details: unable to perform SLR, able to perform ankle pumps, maintained KI, ROM TBA       Communication   Communication: HOH  Cognition Arousal/Alertness: Awake/alert Behavior During Therapy: WFL for tasks assessed/performed Overall Cognitive Status: Within Functional Limits for tasks assessed  General Comments      Exercises     Assessment/Plan    PT Assessment Patient needs continued PT services  PT Problem List Decreased strength;Decreased mobility;Decreased range of motion;Decreased knowledge of use of DME;Pain;Decreased knowledge of precautions       PT Treatment Interventions Stair training;Therapeutic exercise;Gait training;Therapeutic activities;DME instruction;Patient/family education;Functional mobility training    PT Goals (Current goals can be found in the Care Plan section)  Acute Rehab PT Goals PT  Goal Formulation: With patient Time For Goal Achievement: 12/11/17 Potential to Achieve Goals: Good    Frequency 7X/week   Barriers to discharge        Co-evaluation               AM-PAC PT "6 Clicks" Daily Activity  Outcome Measure Difficulty turning over in bed (including adjusting bedclothes, sheets and blankets)?: A Lot Difficulty moving from lying on back to sitting on the side of the bed? : Unable Difficulty sitting down on and standing up from a chair with arms (e.g., wheelchair, bedside commode, etc,.)?: Unable Help needed moving to and from a bed to chair (including a wheelchair)?: A Little Help needed walking in hospital room?: A Little Help needed climbing 3-5 steps with a railing? : A Lot 6 Click Score: 12    End of Session Equipment Utilized During Treatment: Gait belt;Right knee immobilizer Activity Tolerance: Patient tolerated treatment well Patient left: in chair;with call bell/phone within reach;with family/visitor present(on chair alarm pad however no box in room, RN notified) Nurse Communication: Mobility status PT Visit Diagnosis: Other abnormalities of gait and mobility (R26.89)    Time: 1884-1660 PT Time Calculation (min) (ACUTE ONLY): 25 min   Charges:   PT Evaluation $PT Eval Low Complexity: 1 Low     PT G CodesCarmelia Bake, PT, DPT 12/07/2017 Pager: [phone]  York Ram E 12/07/2017, 3:36 PM

## 2017-12-07 NOTE — Anesthesia Procedure Notes (Signed)
Anesthesia Regional Block: Adductor canal block   Pre-Anesthetic Checklist: ,, timeout performed, Correct Patient, Correct Site, Correct Laterality, Correct Procedure, Correct Position, site marked, Risks and benefits discussed,  Surgical consent,  Pre-op evaluation,  At surgeon's request and post-op pain management  Laterality: Right  Prep: chloraprep       Needles:  Injection technique: Single-shot  Needle Type: Echogenic Needle     Needle Length: 9cm  Needle Gauge: 21     Additional Needles:   Procedures:,,,, ultrasound used (permanent image in chart),,,,  Narrative:  Start time: 12/07/2017 8:40 AM End time: 12/07/2017 8:50 AM Injection made incrementally with aspirations every 5 mL.  Performed by: Personally  Anesthesiologist: Effie Berkshire, MD  Additional Notes: Patient tolerated the procedure well. Local anesthetic introduced in an incremental fashion under minimal resistance after negative aspirations. No paresthesias were elicited. After completion of the procedure, no acute issues were identified and patient continued to be monitored by RN.

## 2017-12-07 NOTE — Transfer of Care (Addendum)
Immediate Anesthesia Transfer of Care Note  Patient: Brent Mason  Procedure(s) Performed: RIGHT TOTAL KNEE ARTHROPLASTY (Right Knee)  Patient Location: PACU  Anesthesia Type:GA combined with regional for post-op pain  Level of Consciousness: awake, alert  and oriented  Airway & Oxygen Therapy: Patient Spontanous Breathing and Patient connected to face mask oxygen  Post-op Assessment: Report given to RN and Post -op Vital signs reviewed and stable  Post vital signs: Reviewed and stable  Last Vitals:  Vitals:   12/07/17 0844 12/07/17 0845  BP:    Pulse: 64 (!) 53  Resp: (!) 21   Temp:    SpO2: 99% 99%    Last Pain:  Vitals:   12/07/17 0705  TempSrc:   PainSc: 5          Complications: No apparent anesthesia complications

## 2017-12-07 NOTE — Anesthesia Procedure Notes (Addendum)
Procedure Name: Intubation Performed by: Sharlette Dense, CRNA Patient Re-evaluated:Patient Re-evaluated prior to induction Oxygen Delivery Method: Circle system utilized Preoxygenation: Pre-oxygenation with 100% oxygen Induction Type: IV induction Ventilation: Mask ventilation without difficulty and Oral airway inserted - appropriate to patient size Laryngoscope Size: Glidescope Grade View: Grade I Tube type: Parker flex tip Tube size: 7.5 mm Number of attempts: 1 Airway Equipment and Method: Video-laryngoscopy and Stylet Placement Confirmation: ETT inserted through vocal cords under direct vision,  positive ETCO2 and breath sounds checked- equal and bilateral Secured at: 22 cm Tube secured with: Tape Dental Injury: Teeth and Oropharynx as per pre-operative assessment  Difficulty Due To: Difficulty was anticipated, Difficult Airway- due to reduced neck mobility and Difficult Airway- due to limited oral opening

## 2017-12-07 NOTE — Progress Notes (Signed)
ANTICOAGULATION CONSULT NOTE - Initial Consult  Pharmacy Consult for warfarin Indication: VTE prophylaxis  No Known Allergies  Patient Measurements: Height: 6\' 2"  (188 cm) Weight: 277 lb (125.6 kg) IBW/kg (Calculated) : 82.2   Vital Signs: Temp: 98.1 F (36.7 C) (02/22 1222) Temp Source: Oral (02/22 0637) BP: 159/85 (02/22 1222) Pulse Rate: 89 (02/22 1222)  Labs: Recent Labs    12/07/17 0759  APTT 31  LABPROT 14.7  INR 1.16    Estimated Creatinine Clearance: 86.3 mL/min (by C-G formula based on SCr of 1.09 mg/dL).   Medical History: Past Medical History:  Diagnosis Date  . Anemia   . Anticoagulated on Coumadin    long-term  . Chronic kidney disease    frequent uti's last uti few yrs ago  . Diastolic CHF, chronic ([place])   . Dysrhythmia   . GERD (gastroesophageal reflux disease)   . Headache   . Hiatal hernia   . History of adenomatous polyp of colon   . History of esophageal stricture    s/p  dilatations  . History of GI bleed    03/ 2003  per EGD gastritis  . HOH (hard of hearing)    both ears  . Hypertension   . Non-rheumatic aortic stenosis    per last echo 12-14-2015    . OA (osteoarthritis)    oa  . Persistent atrial fibrillation [facility]) first dx  1999   primary cardiologist-  dr taylor-- hx DCCV  . Pneumonia 2009   with cpap usage  . Shortness of breath    when exertion  . Sleep apnea    last sleep study > 5 yrs   no cpap used     Assessment: 72 yo male admitted for scheduled TKA.  Pt take warfarin for hx of atrial fibrillation.  Home dose 5mg  on Mon, Wed, Fri and 2.5mg  all other days.  Pharmacy consulted to dose warfarin.  Last dose 11/30/17  12/07/2017 INR 1.16 H/H WNL plts WNL  Goal of Therapy:  INR 2-3 Monitor platelets by anticoagulation protocol:   Plan:  Warfarin 5mg  po x 1 at 1800 Daily INR  [place] 12/07/2017, 1:04 PM Pager [phone]

## 2017-12-07 NOTE — Anesthesia Preprocedure Evaluation (Addendum)
Anesthesia Evaluation  Patient identified by MRN, date of birth, ID band Patient awake    Reviewed: Allergy & Precautions, NPO status , Patient's Chart, lab work & pertinent test results  Airway Mallampati: III     Mouth opening: Limited Mouth Opening  Dental  (+) Teeth Intact, Dental Advisory Given   Pulmonary sleep apnea and Continuous Positive Airway Pressure Ventilation ,   Mildly labored.   breath sounds clear to auscultation (-) decreased breath sounds      Cardiovascular hypertension, Pt. on home beta blockers +CHF  + dysrhythmias Atrial Fibrillation + Valvular Problems/Murmurs AS  Rhythm:Regular Rate:Normal + Systolic murmurs    Neuro/Psych  Headaches,  Neuromuscular disease    GI/Hepatic Neg liver ROS, hiatal hernia, GERD  Medicated,  Endo/Other    Renal/GU Renal InsufficiencyRenal disease     Musculoskeletal  (+) Arthritis ,   Abdominal (+) + obese,   Peds  Hematology   Anesthesia Other Findings   Reproductive/Obstetrics                           Lab Results  Component Value Date   WBC 8.1 11/28/2017   HGB 15.4 11/28/2017   HCT 44.6 11/28/2017   MCV 95.7 11/28/2017   PLT 194 11/28/2017   Lab Results  Component Value Date   INR 2.4 11/28/2017   INR 2.0 10/17/2017   INR 2.8 09/05/2017   Echo: Left ventricle: The cavity size was normal. Wall thickness was   increased in a pattern of moderate LVH. Systolic function was   normal. The estimated ejection fraction was in the range of 55%   to 60%. Although no diagnostic regional wall motion abnormality   was identified, this possibility cannot be completely excluded on   the basis of this study. The study is not technically sufficient   to allow evaluation of LV diastolic function. - Aortic valve: Mildly to moderately calcified annulus. Trileaflet;   mildly calcified leaflets. Moderate calcification involving the   left  coronary cusp. There was mild to moderate stenosis based on   planimetry and valve gradients. There was mild regurgitation.   Mean gradient (S): 11 mm Hg. Peak gradient (S): 20 mm Hg. VTI   ratio of LVOT to aortic valve: 0.47. Valve area (VTI): 1.33 cm^2.   Valve area (Vmax): 1.41 cm^2. - Mitral valve: Calcified annulus. Moderately calcified leaflets .   There was trivial regurgitation. - Left atrium: The atrium was moderately dilated. - Right ventricle: The cavity size was mildly dilated. - Right atrium: The atrium was moderately dilated. Central venous   pressure (est): 3 mm Hg. - Atrial septum: No defect or patent foramen ovale was identified. - Tricuspid valve: There was mild regurgitation. - Pulmonary arteries: PA peak pressure: 33 mm Hg (S). - Pericardium, extracardiac: There was no pericardial effusion.   Anesthesia Physical Anesthesia Plan  ASA: III  Anesthesia Plan: General   Post-op Pain Management: GA combined w/ Regional for post-op pain   Induction: Intravenous  PONV Risk Score and Plan: 2 and Ondansetron and Dexamethasone  Airway Management Planned: Oral ETT and Video Laryngoscope Planned  Additional Equipment:   Intra-op Plan:   Post-operative Plan: Extubation in OR  Informed Consent: I have reviewed the patients History and Physical, chart, labs and discussed the procedure including the risks, benefits and alternatives for the proposed anesthesia with the patient or authorized representative who has indicated his/her understanding and acceptance.   Dental advisory  given  Plan Discussed with: CRNA  Anesthesia Plan Comments: (No spinal due to possibly severe AS. )       Anesthesia Quick Evaluation

## 2017-12-07 NOTE — Anesthesia Postprocedure Evaluation (Signed)
Anesthesia Post Note  Patient: Brent Mason  Procedure(s) Performed: RIGHT TOTAL KNEE ARTHROPLASTY (Right Knee)     Patient location during evaluation: PACU Anesthesia Type: General Level of consciousness: awake and alert Pain management: pain level controlled Vital Signs Assessment: post-procedure vital signs reviewed and stable Respiratory status: spontaneous breathing, nonlabored ventilation, respiratory function stable and patient connected to nasal cannula oxygen Cardiovascular status: blood pressure returned to baseline and stable Postop Assessment: no apparent nausea or vomiting Anesthetic complications: no    Last Vitals:  Vitals:   12/07/17 1222 12/07/17 1322  BP: (!) 159/85 (!) 145/68  Pulse: 89 (!) 57  Resp: 20 16  Temp: 36.7 C (!) 36.4 C  SpO2: 98% 92%    Last Pain:  Vitals:   12/07/17 1339  TempSrc:   PainSc: Asleep                 Effie Berkshire

## 2017-12-07 NOTE — Brief Op Note (Signed)
12/07/2017  10:18 AM  PATIENT:  Brent Mason  72 y.o. male  PRE-OPERATIVE DIAGNOSIS:  Osteoarthritis Right Knee  POST-OPERATIVE DIAGNOSIS:  Osteoarthritis Right Knee  PROCEDURE:  Procedure(s): RIGHT TOTAL KNEE ARTHROPLASTY (Right)  SURGEON:  Surgeon(s) and Role:    Mcarthur Rossetti, MD - Primary  PHYSICIAN ASSISTANT: Benita Stabile, PA-C  ANESTHESIA:   local, regional and general  EBL:  100 mL   COUNTS:  YES  TOURNIQUET:   Total Tourniquet Time Documented: Thigh (Right) - -[id number] minutes Total: Thigh (Right) - -[id number] minutes   DICTATION: .Other Dictation: Dictation Number [id number]  PLAN OF CARE: Admit to inpatient   PATIENT DISPOSITION:  PACU - hemodynamically stable.   Delay start of Pharmacological VTE agent (>24hrs) due to surgical blood loss or risk of bleeding: no

## 2017-12-07 NOTE — H&P (Signed)
TOTAL KNEE ADMISSION H&P  Patient is being admitted for right total knee arthroplasty.  Subjective:  Chief Complaint:right knee pain.  HPI: Brent Mason, 72 y.o. male, has a history of pain and functional disability in the right knee due to arthritis and has failed non-surgical conservative treatments for greater than 12 weeks to includeNSAID's and/or analgesics, corticosteriod injections, viscosupplementation injections, flexibility and strengthening excercises, use of assistive devices, weight reduction as appropriate and activity modification.  Onset of symptoms was gradual, starting 3 years ago with gradually worsening course since that time. The patient noted no past surgery on the right knee(s).  Patient currently rates pain in the right knee(s) at 10 out of 10 with activity. Patient has night pain, worsening of pain with activity and weight bearing, pain that interferes with activities of daily living, pain with passive range of motion, crepitus and joint swelling.  Patient has evidence of subchondral sclerosis, periarticular osteophytes and joint space narrowing by imaging studies. There is no active infection.  Patient Active Problem List   Diagnosis Date Noted  . Unilateral primary osteoarthritis, right knee 11/05/2017  . Primary osteoarthritis of both knees 05/28/2017  . S/P foot surgery, right 12/05/2016  . Pain in right foot 12/05/2016  . Pain from implanted hardware 12/05/2016  . Bilateral carotid bruits 07/08/2014  . Encounter for therapeutic drug monitoring 11/13/2013  . Claudication of left lower extremity ([place]) 12/02/2012  . Neck pain 09/16/2012  . Cervical spondylosis without myelopathy 09/10/2012  . Hx of adenomatous colonic polyps 05/07/2012  . Rectal bleeding 05/07/2012  . GERD (gastroesophageal reflux disease) 05/07/2012  . Long term current use of anticoagulant 01/25/2011  . HYPERLIPIDEMIA 11/21/2010  . DYSPNEA 10/20/2010  . MORBID OBESITY 09/01/2009  . Atrial  fibrillation ([place]) 09/01/2009  . Essential hypertension 06/14/2009  . ESOPHAGEAL STRICTURE 06/14/2009  . DIZZINESS 06/14/2009   Past Medical History:  Diagnosis Date  . Anemia   . Anticoagulated on Coumadin    long-term  . Chronic kidney disease    frequent uti's last uti few yrs ago  . Diastolic CHF, chronic ([place])   . Dysrhythmia   . GERD (gastroesophageal reflux disease)   . Headache   . Hiatal hernia   . History of adenomatous polyp of colon   . History of esophageal stricture    s/p  dilatations  . History of GI bleed    03/ 2003  per EGD gastritis  . HOH (hard of hearing)    both ears  . Hypertension   . Non-rheumatic aortic stenosis    per last echo 12-14-2015    . OA (osteoarthritis)    oa  . Persistent atrial fibrillation [facility]) first dx  1999   primary cardiologist-  dr taylor-- hx DCCV  . Pneumonia 2009   with cpap usage  . Shortness of breath    when exertion  . Sleep apnea    last sleep study > 5 yrs   no cpap used    Past Surgical History:  Procedure Laterality Date  . ABDOMINAL HERNIA REPAIR  1990s  . ANTERIOR CERVICAL DECOMP/DISCECTOMY FUSION N/A 02/14/2013   Procedure: ANTERIOR CERVICAL DECOMPRESSION/DISCECTOMY FUSION 2 LEVELS;  Surgeon: Charlie Pitter, MD;  Location: [place] ORS;  Service: Neurosurgery;  Laterality: N/A;  . COLONOSCOPY  last one 06-03-2012  . COLONOSCOPY  06/03/2012   Procedure: COLONOSCOPY;  Surgeon: Daneil Dolin, MD;  Location: [facility];  Service: Endoscopy;  Laterality: N/A;  8:30  . esophageal strecching  yrs ago  . ESOPHAGUS SURGERY  20 yrs ago   for food sticking  . FOOT SURGERY Right 04-27-2010   dr duda   joint arthrodesis  . right foot surgery  2018   to remove screw  . TRANSTHORACIC ECHOCARDIOGRAM  12-14-2015  dr taylor   moderate LVH, ef 55-60%, indeterminate diastolic function in setting of AFib/  moderate LAE and RAE/  mild to moderate calcific aoric stenosis with mild AR (valve area 1.33cm^2, peak grandient60mmHg,  mean grandient 51mmHg)/ trivial MR/ mild TR/ PASP 61mmHg  . VOLVULUS REDUCTION  [id number]    Current Facility-Administered Medications  Medication Dose Route Frequency Provider Last Rate Last Dose  . bupivacaine liposome (EXPAREL) 1.3 % injection 266 mg  20 mL Infiltration Once Mcarthur Rossetti, MD      . ceFAZolin (ANCEF) 3 g in dextrose 5 % 50 mL IVPB  3 g Intravenous On Call to [place], [place], [place]      . chlorhexidine (HIBICLENS) 4 % liquid 4 application  60 mL Topical Once Erskine Emery W, PA-C      . fentaNYL (SUBLIMAZE) injection 50-100 mcg  50-100 mcg Intravenous UD Effie Berkshire, MD      . lactated ringers infusion   Intravenous Continuous Effie Berkshire, MD 75 mL/hr at 12/07/17 0720    . midazolam (VERSED) injection 1-2 mg  1-2 mg Intravenous UD Effie Berkshire, MD      . tranexamic acid (CYKLOKAPRON) 1,000 mg in sodium chloride 0.9 % 100 mL IVPB  1,000 mg Intravenous To OR Zeigler, Sandi Mealy, RPH       No Known Allergies  Social History   Tobacco Use  . Smoking status: Never Smoker  . Smokeless tobacco: Never Used  Substance Use Topics  . Alcohol use: No    Alcohol/week: 0.0 oz    Family History  Problem Relation Age of Onset  . Heart disease Mother        Pacemaker  . Diabetes Father   . Colon cancer Neg Hx      Review of Systems  Musculoskeletal: Positive for joint pain.  All other systems reviewed and are negative.   Objective:  Physical Exam  Constitutional: He is oriented to person, place, and time. He appears well-developed and well-nourished.  HENT:  Head: Normocephalic and atraumatic.  Eyes: EOM are normal. Pupils are equal, round, and reactive to light.  Neck: Normal range of motion.  Cardiovascular: Normal rate.  Respiratory: Effort normal.  GI: Soft.  Musculoskeletal:       Right knee: He exhibits decreased range of motion, swelling, effusion, abnormal alignment, bony tenderness and abnormal meniscus. Tenderness found. Medial joint  line and lateral joint line tenderness noted.  Neurological: He is alert and oriented to person, place, and time.  Skin: Skin is warm and dry.  Psychiatric: He has a normal mood and affect.    Vital signs in last 24 hours: Temp:  [97.9 F (36.6 C)] 97.9 F (36.6 C) (02/22 0637) Pulse Rate:  [65] 65 (02/22 0637) Resp:  [18] 18 (02/22 0637) BP: (139)/(92) 139/92 (02/22 0637) SpO2:  [98 %] 98 % (02/22 0637) Weight:  [277 lb (125.6 kg)] 277 lb (125.6 kg) (02/22 0705)  Labs:   Estimated body mass index is 35.56 kg/m as calculated from the following:   Height as of this encounter: 6\' 2"  (1.88 m).   Weight as of this encounter: 277 lb (125.6 kg).   Imaging Review Plain radiographs  demonstrate severe degenerative joint disease of the right knee(s). The overall alignment ismild varus. The bone quality appears to be good for age and reported activity level.  Assessment/Plan:  End stage arthritis, right knee   The patient history, physical examination, clinical judgment of the provider and imaging studies are consistent with end stage degenerative joint disease of the right knee(s) and total knee arthroplasty is deemed medically necessary. The treatment options including medical management, injection therapy arthroscopy and arthroplasty were discussed at length. The risks and benefits of total knee arthroplasty were presented and reviewed. The risks due to aseptic loosening, infection, stiffness, patella tracking problems, thromboembolic complications and other imponderables were discussed. The patient acknowledged the explanation, agreed to proceed with the plan and consent was signed. Patient is being admitted for inpatient treatment for surgery, pain control, PT, OT, prophylactic antibiotics, VTE prophylaxis, progressive ambulation and ADL's and discharge planning. The patient is planning to be discharged home with home health services

## 2017-12-08 ENCOUNTER — Inpatient Hospital Stay (HOSPITAL_COMMUNITY): Payer: Medicare Other

## 2017-12-08 LAB — BASIC METABOLIC PANEL
ANION GAP: 13 (ref 5–15)
BUN: 14 mg/dL (ref 6–20)
CO2: 22 mmol/L (ref 22–32)
Calcium: 8.3 mg/dL — ABNORMAL LOW (ref 8.9–10.3)
Chloride: 102 mmol/L (ref 101–111)
Creatinine, Ser: 0.95 mg/dL (ref 0.61–1.24)
GFR calc Af Amer: >60 mL/min (ref >60)
Glucose, Bld: 186 mg/dL — ABNORMAL HIGH (ref 65–99)
POTASSIUM: 4.1 mmol/L (ref 3.5–5.1)
SODIUM: 137 mmol/L (ref 135–145)

## 2017-12-08 LAB — CBC
HCT: 38.3 % — ABNORMAL LOW (ref 39.0–52.0)
Hemoglobin: 12.6 g/dL — ABNORMAL LOW (ref 13.0–17.0)
MCH: 31.7 pg (ref 26.0–34.0)
MCHC: 32.9 g/dL (ref 30.0–36.0)
MCV: 96.5 fL (ref 78.0–100.0)
Platelets: 186 10*3/uL (ref 150–400)
RBC: 3.97 MIL/uL — AB (ref 4.22–5.81)
RDW: 12.5 % (ref 11.5–15.5)
WBC: 16.5 10*3/uL — AB (ref 4.0–10.5)

## 2017-12-08 LAB — PROTIME-INR
INR: 1.07
PROTHROMBIN TIME: 13.8 s (ref 11.4–15.2)

## 2017-12-08 MED ORDER — WARFARIN SODIUM 5 MG PO TABS
5.0000 mg | ORAL_TABLET | Freq: Once | ORAL | Status: AC
  Administered 2017-12-09: 5 mg via ORAL
  Filled 2017-12-08: qty 1

## 2017-12-08 MED ORDER — METHOCARBAMOL 1000 MG/10ML IJ SOLN
1000.0000 mg | Freq: Once | INTRAVENOUS | Status: AC
  Administered 2017-12-09: 1000 mg via INTRAVENOUS
  Filled 2017-12-08 (×2): qty 10

## 2017-12-08 NOTE — Progress Notes (Signed)
Occupational Therapy Evaluation Patient Details Name: Brent Mason MRN: [id number] DOB: 1946/07/17 Today's Date: 12/08/2017    History of Present Illness Pt is a 72 year old male s/p R TKA   Clinical Impression   Evaluation limited due to 9/10 pain. Patient declined OOB activities due to pain. Began education on OT plan of care and nurse in to give pain medication. OT will follow.    Follow Up Recommendations  Supervision/Assistance - 24 hour    Equipment Recommendations  None recommended by OT    Recommendations for Other Services       Precautions / Restrictions Precautions Precautions: Knee;Fall Required Braces or Orthoses: Knee Immobilizer - Right Knee Immobilizer - Right: Discontinue once straight leg raise with < 10 degree lag Restrictions Other Position/Activity Restrictions: WBAT      Mobility Bed Mobility               General bed mobility comments: pt declined due to pain  Transfers             General transfer comment: pt declined due to pain    Balance                                           ADL either performed or assessed with clinical judgement   ADL Overall ADL's : Needs assistance/impaired                                       General ADL Comments: Evaluation limited this date due to patient's increased pain level. Unable to tolerate any OOB this date. Patient's lunch tray arrived during session and assisted him with positioning in bed to eat. Patient decliner getting OOB to recliner to eat lunch, stating he could not get proper positioning in recliner to eat.     Vision Baseline Vision/History: Wears glasses Patient Visual Report: No change from baseline       Perception     Praxis      Pertinent Vitals/Pain Pain Assessment: 0-10 Pain Score: 9  Pain Location: R knee Pain Descriptors / Indicators: Sore;Aching Pain Intervention(s): Patient requesting pain meds-RN notified;RN gave  pain meds during session;Monitored during session     Hand Dominance     Extremity/Trunk Assessment Upper Extremity Assessment Upper Extremity Assessment: Overall WFL for tasks assessed   Lower Extremity Assessment Lower Extremity Assessment: Defer to PT evaluation RLE Deficits / Details: pt reports previous ankle injury s/p surgery that is painful       Communication Communication Communication: HOH   Cognition Arousal/Alertness: Awake/alert Behavior During Therapy: WFL for tasks assessed/performed Overall Cognitive Status: Within Functional Limits for tasks assessed                                     General Comments  reports increased ankle pain with CPM use, nurse aware    Exercises     Shoulder Instructions      Home Living Family/patient expects to be discharged to:: Private residence Living Arrangements: Spouse/significant other Available Help at Discharge: Family Type of Home: House Home Access: Ramped entrance     Home Layout: One level     Bathroom Shower/Tub: Occupational psychologist:  Handicapped height Bathroom Accessibility: Yes How Accessible: Accessible via walker Home Equipment: Shower seat - built in;Grab bars - tub/shower;Grab bars - toilet;Hand held shower head   Additional Comments: wants tall RW for discharge      Prior Functioning/Environment Level of Independence: Independent                 OT Problem List:        OT Treatment/Interventions: Self-care/ADL training;DME and/or AE instruction;Therapeutic activities;Patient/family education    OT Goals(Current goals can be found in the care plan section) Acute Rehab OT Goals Patient Stated Goal: decreased pain OT Goal Formulation: With patient Time For Goal Achievement: 12/22/17 Potential to Achieve Goals: Good ADL Goals Pt Will Perform Lower Body Bathing: with min assist;sit to/from stand Pt Will Perform Lower Body Dressing: with min assist;sit  to/from stand Pt Will Transfer to Toilet: with supervision;ambulating;bedside commode;grab bars Pt Will Perform Toileting - Clothing Manipulation and hygiene: with supervision;sit to/from stand Pt Will Perform Tub/Shower Transfer: Shower transfer;with min guard assist;ambulating;shower seat;grab bars;rolling walker  OT Frequency: Min 2X/week   Barriers to D/C:            Co-evaluation              AM-PAC PT "6 Clicks" Daily Activity     Outcome Measure Help from another person eating meals?: None Help from another person taking care of personal grooming?: A Little Help from another person toileting, which includes using toliet, bedpan, or urinal?: A Lot Help from another person bathing (including washing, rinsing, drying)?: A Little Help from another person to put on and taking off regular upper body clothing?: A Little Help from another person to put on and taking off regular lower body clothing?: A Lot 6 Click Score: 17   End of Session CPM Right Knee CPM Right Knee: Off Nurse Communication: Patient requests pain meds  Activity Tolerance: Patient limited by pain Patient left: in bed;with call bell/phone within reach;with family/visitor present;with nursing/sitter in room  OT Visit Diagnosis: Unsteadiness on feet (R26.81);Pain Pain - Right/Left: Right Pain - part of body: Knee;Ankle and joints of foot                Time: 1250-1307 OT Time Calculation (min): 17 min Charges:  OT General Charges $OT Visit: 1 Visit OT Evaluation $OT Eval Low Complexity: 1 Low G-Codes:       Alya Smaltz A Maritza G[place] 18-Dec-2017, 2:30 PM

## 2017-12-08 NOTE — Progress Notes (Signed)
[place] for warfarin Indication: VTE prophylaxis on PTA for afib  No Known Allergies  Patient Measurements: Height: 6\' 2"  (188 cm) Weight: 277 lb (125.6 kg) IBW/kg (Calculated) : 82.2   Vital Signs: Temp: 97.6 F (36.4 C) (02/23 1000) Temp Source: Oral (02/23 1000) BP: 121/66 (02/23 1000) Pulse Rate: 73 (02/23 1000)  Labs: Recent Labs    12/07/17 0759 12/08/17 0609  HGB  --  12.6*  HCT  --  38.3*  PLT  --  186  APTT 31  --   LABPROT 14.7 13.8  INR 1.16 1.07  CREATININE  --  0.95    Estimated Creatinine Clearance: 99 mL/min (by C-G formula based on SCr of 0.95 mg/dL).   Assessment: 72 yo male admitted for scheduled TKA.  Pt take warfarin for hx of atrial fibrillation.  Home dose 5mg  on Mon, Wed, Fri and 2.5mg  all other days.  Pharmacy consulted to dose warfarin.  Last dose 11/30/17 12/08/2017 INR 1.07, Hg 12.6, PLTC 185.   Goal of Therapy:  INR 2-3 Monitor platelets by anticoagulation protocol:   Plan:  Warfarin 5mg  po x 1 at 1800 Daily INR Consider adding LMWH bridge tx until INR tx  Eudelia Bunch, Pharm.D. [phone] 12/08/2017 11:27 AM

## 2017-12-08 NOTE — Progress Notes (Signed)
Subjective: 1 Day Post-Op Procedure(s) (LRB): RIGHT TOTAL KNEE ARTHROPLASTY (Right) Patient reports pain as moderate.   Labs and vitals stable Objective: Vital signs in last 24 hours: Temp:  [97 F (36.1 C)-99.8 F (37.7 C)] 97.6 F (36.4 C) (02/23 1000) Pulse Rate:  [48-89] 73 (02/23 1000) Resp:  [16-21] 18 (02/23 1000) BP: (121-183)/(66-96) 121/66 (02/23 1000) SpO2:  [91 %-100 %] 91 % (02/23 0630)  Intake/Output from previous day: 02/22 0701 - 02/23 0700 In: 3780 [P.O.:840; I.V.:2525; IV Piggyback:415] Out: 1500 [Urine:1400; Blood:100] Intake/Output this shift: Total I/O In: 360 [P.O.:360] Out: -   Recent Labs    12/08/17 0609  HGB 12.6*   Recent Labs    12/08/17 0609  WBC 16.5*  RBC 3.97*  HCT 38.3*  PLT 186   Recent Labs    12/08/17 0609  NA 137  K 4.1  CL 102  CO2 22  BUN 14  CREATININE 0.95  GLUCOSE 186*  CALCIUM 8.3*   Recent Labs    12/07/17 0759 12/08/17 0609  INR 1.16 1.07    Sensation intact distally Intact pulses distally Dorsiflexion/Plantar flexion intact Incision: no drainage No cellulitis present Compartment soft  Assessment/Plan: 1 Day Post-Op Procedure(s) (LRB): RIGHT TOTAL KNEE ARTHROPLASTY (Right) Up with therapy Discharge home with home health Monday  Mcarthur Rossetti 12/08/2017, 11:04 AM

## 2017-12-08 NOTE — Discharge Instructions (Signed)

## 2017-12-08 NOTE — Progress Notes (Signed)
Physical Therapy Treatment Patient Details Name: Brent Mason MRN: [id number] DOB: [birth year] Today's Date: 12/08/2017    History of Present Illness Pt is a 72 year old male s/p R TKA    PT Comments    Pt ambulated in hallway however distance limited due to pain.  RN notified of request for pain meds.   Follow Up Recommendations  Home health PT;Follow surgeon's recommendation for DC plan and follow-up therapies     Equipment Recommendations  Rolling walker with 5" wheels    Recommendations for Other Services       Precautions / Restrictions Precautions Precautions: Knee;Fall Required Braces or Orthoses: Knee Immobilizer - Right Knee Immobilizer - Right: Discontinue once straight leg raise with < 10 degree lag Restrictions Weight Bearing Restrictions: No Other Position/Activity Restrictions: WBAT    Mobility  Bed Mobility               General bed mobility comments: pt sitting in recliner with feet on the floor upon arrival - discussed knee precautions  Transfers Overall transfer level: Needs assistance Equipment used: Rolling walker (2 wheeled) Transfers: Sit to/from Stand Sit to Stand: Min assist         General transfer comment: verbal cues for UE and LE positioning, assist to rise and control descent  Ambulation/Gait Ambulation/Gait assistance: Min guard Ambulation Distance (Feet): 40 Feet Assistive device: Rolling walker (2 wheeled) Gait Pattern/deviations: Decreased stance time - right;Step-to pattern;Antalgic     General Gait Details: verbal cues for sequence, RW positioning, step length, posture, distance limited by pain, pt also with urinary urgency after foley removed    Stairs            Wheelchair Mobility    Modified Rankin (Stroke Patients Only)       Balance                                            Cognition Arousal/Alertness: Awake/alert Behavior During Therapy: WFL for tasks  assessed/performed Overall Cognitive Status: Within Functional Limits for tasks assessed                                        Exercises      General Comments        Pertinent Vitals/Pain Pain Assessment: 0-10 Pain Score: 8  Pain Location: R knee Pain Descriptors / Indicators: Sore;Aching Pain Intervention(s): Limited activity within patient's tolerance;Repositioned;Monitored during session;Patient requesting pain meds-RN notified    Home Living                      Prior Function            PT Goals (current goals can now be found in the care plan section) Progress towards PT goals: Progressing toward goals    Frequency    7X/week      PT Plan Current plan remains appropriate    Co-evaluation              AM-PAC PT "6 Clicks" Daily Activity  Outcome Measure  Difficulty turning over in bed (including adjusting bedclothes, sheets and blankets)?: A Lot Difficulty moving from lying on back to sitting on the side of the bed? : Unable Difficulty sitting down on and standing up from a  chair with arms (e.g., wheelchair, bedside commode, etc,.)?: Unable Help needed moving to and from a bed to chair (including a wheelchair)?: A Little Help needed walking in hospital room?: A Little Help needed climbing 3-5 steps with a railing? : A Lot 6 Click Score: 12    End of Session Equipment Utilized During Treatment: Gait belt;Right knee immobilizer Activity Tolerance: Patient limited by pain Patient left: in chair;with call bell/phone within reach;with family/visitor present Nurse Communication: Mobility status;Patient requests pain meds PT Visit Diagnosis: Other abnormalities of gait and mobility (R26.89)     Time: 0950-1007 PT Time Calculation (min) (ACUTE ONLY): 17 min  Charges:  $Gait Training: 8-22 mins                    G Codes:       Carmelia Bake, PT, DPT 12/08/2017 Pager: [phone]  York Ram E 12/08/2017, 11:13  AM

## 2017-12-08 NOTE — Progress Notes (Signed)
Physical Therapy Treatment Patient Details Name: Brent Mason MRN: [id number] DOB: [birth year] Today's Date: 12/08/2017    History of Present Illness Pt is a 72 year old male s/p R TKA    PT Comments    Pt performed LE exercises in supine and then ambulated again in hallway.  Ice packs placed on R knee end of session.   Follow Up Recommendations  Home health PT;Follow surgeon's recommendation for DC plan and follow-up therapies     Equipment Recommendations  Rolling walker with 5" wheels    Recommendations for Other Services       Precautions / Restrictions Precautions Precautions: Knee;Fall Precaution Comments: reviewed KI use Required Braces or Orthoses: Knee Immobilizer - Right Knee Immobilizer - Right: Discontinue once straight leg raise with < 10 degree lag Restrictions Other Position/Activity Restrictions: WBAT    Mobility  Bed Mobility Overal bed mobility: Needs Assistance Bed Mobility: Supine to Sit     Supine to sit: Min assist;HOB elevated     General bed mobility comments: assist for trunk upright  Transfers Overall transfer level: Needs assistance Equipment used: Rolling walker (2 wheeled) Transfers: Sit to/from Stand Sit to Stand: Min assist         General transfer comment: verbal cues for UE and LE positioning, min/guard for safety, increased effort, assist for controlling descent  Ambulation/Gait Ambulation/Gait assistance: Min guard Ambulation Distance (Feet): 120 Feet Assistive device: Rolling walker (2 wheeled) Gait Pattern/deviations: Decreased stance time - right;Step-to pattern;Antalgic     General Gait Details: verbal cues for sequence, RW positioning, step length, posture   Stairs            Wheelchair Mobility    Modified Rankin (Stroke Patients Only)       Balance                                            Cognition Arousal/Alertness: Awake/alert Behavior During Therapy: WFL for tasks  assessed/performed Overall Cognitive Status: Within Functional Limits for tasks assessed                                        Exercises Total Joint Exercises Ankle Circles/Pumps: AROM;Both;10 reps Quad Sets: AROM;Right;10 reps Short Arc Quad: AROM;10 reps;Right Heel Slides: AAROM;Right;10 reps Hip ABduction/ADduction: AROM;Right;10 reps Straight Leg Raises: AAROM;Right;10 reps Goniometric ROM: approximately -8-35* AAROM R knee supine    General Comments General comments (skin integrity, edema, etc.): reports increased ankle pain with CPM use, nurse aware      Pertinent Vitals/Pain Pain Assessment: 0-10 Pain Score: 8  Pain Location: R knee/ankle Pain Descriptors / Indicators: Sore;Aching Pain Intervention(s): Limited activity within patient's tolerance;Monitored during session;Repositioned;Ice applied    Home Living Family/patient expects to be discharged to:: Private residence Living Arrangements: Spouse/significant other Available Help at Discharge: Family Type of Home: House Home Access: Ramped entrance   [place]: [facility]: Shower seat - built in;Grab bars - tub/shower;Grab bars - toilet;Hand held shower head Additional Comments: wants tall RW for discharge    Prior Function Level of Independence: Independent          PT Goals (current goals can now be found in the care plan section) Acute Rehab PT Goals Patient Stated Goal: decreased pain Progress towards PT goals: Progressing  toward goals    Frequency    7X/week      PT Plan Current plan remains appropriate    Co-evaluation              AM-PAC PT "6 Clicks" Daily Activity  Outcome Measure  Difficulty turning over in bed (including adjusting bedclothes, sheets and blankets)?: A Lot Difficulty moving from lying on back to sitting on the side of the bed? : Unable Difficulty sitting down on and standing up from a chair with arms (e.g., wheelchair, bedside  commode, etc,.)?: Unable Help needed moving to and from a bed to chair (including a wheelchair)?: A Little Help needed walking in hospital room?: A Little Help needed climbing 3-5 steps with a railing? : A Lot 6 Click Score: 12    End of Session Equipment Utilized During Treatment: Gait belt;Right knee immobilizer Activity Tolerance: Patient tolerated treatment well Patient left: with call bell/phone within reach;with family/visitor present;in chair;with chair alarm set   PT Visit Diagnosis: Other abnormalities of gait and mobility (R26.89)     Time: 1436-1500 PT Time Calculation (min) (ACUTE ONLY): 24 min  Charges:  $Gait Training: 8-22 mins $Therapeutic Exercise: 8-22 mins                    G Codes:      Carmelia Bake, PT, DPT 12/08/2017 Pager: [phone]  York Ram E 12/08/2017, 3:52 PM

## 2017-12-09 ENCOUNTER — Encounter (HOSPITAL_COMMUNITY): Payer: Self-pay | Admitting: Gastroenterology

## 2017-12-09 DIAGNOSIS — R062 Wheezing: Secondary | ICD-10-CM

## 2017-12-09 DIAGNOSIS — Z96651 Presence of right artificial knee joint: Secondary | ICD-10-CM

## 2017-12-09 DIAGNOSIS — K222 Esophageal obstruction: Secondary | ICD-10-CM

## 2017-12-09 LAB — PROTIME-INR
INR: 1.34
Prothrombin Time: 16.5 seconds — ABNORMAL HIGH (ref 11.4–15.2)

## 2017-12-09 MED ORDER — ALBUTEROL SULFATE (2.5 MG/3ML) 0.083% IN NEBU
2.5000 mg | INHALATION_SOLUTION | Freq: Once | RESPIRATORY_TRACT | Status: AC
  Administered 2017-12-09: 2.5 mg via RESPIRATORY_TRACT
  Filled 2017-12-09: qty 3

## 2017-12-09 MED ORDER — WARFARIN SODIUM 2.5 MG PO TABS
2.5000 mg | ORAL_TABLET | Freq: Once | ORAL | Status: AC
  Administered 2017-12-09: 2.5 mg via ORAL
  Filled 2017-12-09: qty 1

## 2017-12-09 NOTE — Progress Notes (Addendum)
Physical Therapy Treatment Patient Details Name: Brent Mason MRN: [id number] DOB: Apr 09, 1946 Today's Date: 12/09/2017    History of Present Illness Pt is a 72 year old male s/p R TKA    PT Comments    Pt dtr is requesting pt go to SNF, second dtr upset about KI--reviewed use, reasoning and rules for KI per surgeon; pt did well with PT today and feel he can d/c home; pt seems to be comfortable with this plan/HHPT however family requesting SW and SNF; will continue to follow--SNF vs HHPT  Follow Up Recommendations  Home health PT vs SNF;Follow surgeon's recommendation for DC plan and follow-up therapies     Equipment Recommendations  Rolling walker with 5" wheels    Recommendations for Other Services       Precautions / Restrictions Precautions Precautions: Knee;Fall Precaution Comments: KI not utilized as pt was able to perform IND SLRs Required Braces or Orthoses: Knee Immobilizer - Right Knee Immobilizer - Right: Discontinue once straight leg raise with < 10 degree lag Restrictions Weight Bearing Restrictions: No Other Position/Activity Restrictions: WBAT    Mobility  Bed Mobility               General bed mobility comments: pt received in chair  Transfers Overall transfer level: Needs assistance Equipment used: Rolling walker (2 wheeled) Transfers: Sit to/from Stand Sit to Stand: Min guard;Supervision         General transfer comment: verbal cues for UE and LE positioning, min/guard for safety, increased effort and pt uses momentum to stand  Ambulation/Gait Ambulation/Gait assistance: Min guard;Supervision Ambulation Distance (Feet): 60 Feet Assistive device: Rolling walker (2 wheeled) Gait Pattern/deviations: Decreased stance time - right;Step-to pattern;Antalgic     General Gait Details: verbal cues for sequence, RW positioning, step length, posture   Stairs            Wheelchair Mobility    Modified Rankin (Stroke Patients Only)        Balance                                            Cognition Arousal/Alertness: Awake/alert Behavior During Therapy: WFL for tasks assessed/performed Overall Cognitive Status: Within Functional Limits for tasks assessed                                        Exercises Total Joint Exercises Ankle Circles/Pumps: AROM;Both;10 reps Quad Sets: AROM;Right;10 reps Short Arc Quad: AROM;10 reps;Right Heel Slides: AAROM;Right;10 reps Hip ABduction/ADduction: AROM;Right;10 reps Straight Leg Raises: AAROM;Right;10 reps;AROM;Other (comment)(required assist with  first 2 reps) Goniometric ROM: 8* to 70*    General Comments        Pertinent Vitals/Pain Pain Assessment: 0-10 Pain Score: 8  Pain Location: R knee/ankle Pain Descriptors / Indicators: Sore;Aching Pain Intervention(s): Limited activity within patient's tolerance;Monitored during session;Premedicated before session;Ice applied    Home Living                      Prior Function            PT Goals (current goals can now be found in the care plan section) Acute Rehab PT Goals Patient Stated Goal: decreased pain PT Goal Formulation: With patient Time For Goal Achievement: 12/11/17 Potential to Achieve Goals:  Good Progress towards PT goals: Progressing toward goals    Frequency    7X/week      PT Plan Current plan remains appropriate    Co-evaluation              AM-PAC PT "6 Clicks" Daily Activity  Outcome Measure  Difficulty turning over in bed (including adjusting bedclothes, sheets and blankets)?: A Lot Difficulty moving from lying on back to sitting on the side of the bed? : Unable Difficulty sitting down on and standing up from a chair with arms (e.g., wheelchair, bedside commode, etc,.)?: Unable Help needed moving to and from a bed to chair (including a wheelchair)?: A Little Help needed walking in hospital room?: A Little Help needed climbing 3-5  steps with a railing? : A Lot 6 Click Score: 12    End of Session Equipment Utilized During Treatment: Gait belt Activity Tolerance: Patient tolerated treatment well Patient left: with call bell/phone within reach;with family/visitor present;in chair;with chair alarm set   PT Visit Diagnosis: Other abnormalities of gait and mobility (R26.89)     Time: 2751-7001 PT Time Calculation (min) (ACUTE ONLY): 29 min  Charges:  $Gait Training: 8-22 mins $Therapeutic Exercise: 8-22 mins                    G CodesKenyon Ana, PT Pager: [phone] 12/09/2017    Kenyon Ana 12/09/2017, 1:23 PM

## 2017-12-09 NOTE — Consult Note (Addendum)
Medical Consultation   Brent Mason  UXL:[id number]  DOB: Mar 31, 1946  DOA: 12/07/2017  PCP: Redmond School, MD Outpatient Specialists: Dr. Fuller Plan [facility])  Requesting physician: Layne Benton  Reason for consultation: Dysphagia  History of Present Illness: Brent Mason is an 72 y.o. male with h/o CKD, diastolic CHF, A.Fib on coumadin, esophageal stricture s/p dilations.  He is currently in the hospital s/p R TKA.  This evening he had [place] without gravy, and immediately after eating felt like food was stuck in esophagus.  He had multiple episodes of vomiting.  Was unable to keep water down.  Vomit was initially brown, then clear, then brown again.  He also tried drinking carbonated beverage as well.  Dilaudid and nausea meds were given.  Thankfully by the time I am evaluating patient, it seems that the food impaction of esophagus has resolved patients reports (either on its own, or thanks to carbonated beverage).  He proves this by drinking water in front of me, and has not vomited for the past 45 mins still.  On talking with patient and daughter.  He does have extensive history of esophageal strictures, has had to have food disimpacted from esophagus before in past.  Hasnt had any trouble with this recently he says.   Review of Systems:  ROS As per HPI otherwise 10 point review of systems negative.    Past Medical History: Past Medical History:  Diagnosis Date  . Anemia   . Anticoagulated on Coumadin    long-term  . Chronic kidney disease    frequent uti's last uti few yrs ago  . Diastolic CHF, chronic ([place])   . Dysrhythmia   . GERD (gastroesophageal reflux disease)   . Headache   . Hiatal hernia   . History of adenomatous polyp of colon   . History of esophageal stricture    s/p  dilatations  . History of GI bleed    03/ 2003  per EGD gastritis  . HOH (hard of hearing)    both ears  . Hypertension   . Non-rheumatic aortic stenosis      per last echo 12-14-2015    . OA (osteoarthritis)    oa  . Persistent atrial fibrillation [facility]) first dx  1999   primary cardiologist-  dr taylor-- hx DCCV  . Pneumonia 2009   with cpap usage  . Shortness of breath    when exertion  . Sleep apnea    last sleep study > 5 yrs   no cpap used    Past Surgical History: Past Surgical History:  Procedure Laterality Date  . ABDOMINAL HERNIA REPAIR  1990s  . ANTERIOR CERVICAL DECOMP/DISCECTOMY FUSION N/A 02/14/2013   Procedure: ANTERIOR CERVICAL DECOMPRESSION/DISCECTOMY FUSION 2 LEVELS;  Surgeon: Charlie Pitter, MD;  Location: [place] [facility];  Service: Neurosurgery;  Laterality: N/A;  . COLONOSCOPY  last one 06-03-2012  . COLONOSCOPY  06/03/2012   Procedure: COLONOSCOPY;  Surgeon: Daneil Dolin, MD;  [facility];  Service: Endoscopy;  Laterality: N/A;  8:30  . esophageal strecching  yrs ago  . ESOPHAGUS SURGERY  20 yrs ago   for food sticking  . FOOT SURGERY Right 04-27-2010   dr duda   joint arthrodesis  . right foot surgery  2018   to remove screw  . TOTAL KNEE ARTHROPLASTY Right 12/07/2017   Procedure: RIGHT TOTAL KNEE ARTHROPLASTY;  Surgeon: Jean Rosenthal  Y, MD;  Location: [facility];  Service: Orthopedics;  Laterality: Right;  . TRANSTHORACIC ECHOCARDIOGRAM  12-14-2015  dr taylor   moderate LVH, ef 55-60%, indeterminate diastolic function in setting of AFib/  moderate LAE and RAE/  mild to moderate calcific aoric stenosis with mild AR (valve area 1.33cm^2, peak grandient38mmHg, mean grandient 4mmHg)/ trivial MR/ mild TR/ PASP 41mmHg  . VOLVULUS REDUCTION  [id number],[id number]     Allergies:  No Known Allergies   Social History:  reports that  has never smoked. he has never used smokeless tobacco. He reports that he does not drink alcohol or use drugs.   Family History: Family History  Problem Relation Age of Onset  . Heart disease Mother        Pacemaker  . Diabetes Father   . Colon cancer Neg Hx      Physical  Exam: Vitals:   12/08/17 1000 12/08/17 1402 12/08/17 1830 12/08/17 2337  BP: 121/66 123/70 101/65 136/69  Pulse: 73 68 78 76  Resp: 18 20 18 19   Temp: 97.6 F (36.4 C) 97.7 F (36.5 C) 98.5 F (36.9 C) 98.5 F (36.9 C)  TempSrc: Oral Oral Oral Oral  SpO2:  97% 100% 95%  Weight:      Height:        Constitutional: Alert and awake, oriented x3, not in any acute distress. Eyes: PERLA, EOMI, irises appear normal, anicteric sclera,  ENMT: external ears and nose appear normal,            Lips appears normal, oropharynx mucosa, tongue, posterior pharynx appear normal  Neck: neck appears normal, no masses, normal ROM, no thyromegaly, no JVD  CVS: S1-S2 clear, no murmur rubs or gallops, no LE edema, normal pedal pulses  Respiratory:  clear to auscultation bilaterally, no wheezing, rales or rhonchi. Respiratory effort normal. No accessory muscle use.  Abdomen: soft nontender, nondistended, normal bowel sounds, no hepatosplenomegaly, no hernias  Musculoskeletal: : no cyanosis, clubbing or edema noted bilaterally Neuro: Cranial nerves II-XII intact, strength, sensation, reflexes Psych: judgement and insight appear normal, stable mood and affect, mental status Skin: no rashes or lesions or ulcers, no induration or nodules    Data reviewed:  I have personally reviewed following labs and imaging studies Labs:  CBC: Recent Labs  Lab 12/08/17 0609  WBC 16.5*  HGB 12.6*  HCT 38.3*  MCV 96.5  PLT 176    Basic Metabolic Panel: Recent Labs  Lab 12/08/17 0609  NA 137  K 4.1  CL 102  CO2 22  GLUCOSE 186*  BUN 14  CREATININE 0.95  CALCIUM 8.3*   GFR Estimated Creatinine Clearance: 99 mL/min (by C-G formula based on SCr of 0.95 mg/dL). Liver Function Tests: No results for input(s): AST, ALT, ALKPHOS, BILITOT, PROT, ALBUMIN in the last 168 hours. No results for input(s): LIPASE, AMYLASE in the last 168 hours. No results for input(s): AMMONIA in the last 168 hours. Coagulation  profile Recent Labs  Lab 12/07/17 0759 12/08/17 0609  INR 1.16 1.07    Cardiac Enzymes: No results for input(s): CKTOTAL, CKMB, CKMBINDEX, TROPONINI in the last 168 hours. BNP: Invalid input(s): POCBNP CBG: No results for input(s): GLUCAP in the last 168 hours. D-Dimer No results for input(s): DDIMER in the last 72 hours. Hgb A1c No results for input(s): HGBA1C in the last 72 hours. Lipid Profile No results for input(s): CHOL, HDL, LDLCALC, TRIG, CHOLHDL, LDLDIRECT in the last 72 hours. Thyroid function studies No results for input(s): TSH, T4TOTAL,  T3FREE, THYROIDAB in the last 72 hours.  Invalid input(s): FREET3 Anemia work up No results for input(s): VITAMINB12, FOLATE, FERRITIN, TIBC, IRON, RETICCTPCT in the last 72 hours. Urinalysis No results found for: COLORURINE, APPEARANCEUR, LABSPEC, [place], GLUCOSEU, HGBUR, BILIRUBINUR, KETONESUR, PROTEINUR, UROBILINOGEN, NITRITE, [place]   Microbiology No results found for this or any previous visit (from the past 240 hour(s)).     Inpatient Medications:   Scheduled Meds: . albuterol  2.5 mg Nebulization Once  . atorvastatin  10 mg Oral q1800  . carvedilol  3.125 mg Oral BID WC  . docusate sodium  100 mg Oral BID  . ferrous sulfate  325 mg Oral Q breakfast  . fluticasone  1 spray Each Nare Daily  . furosemide  40 mg Oral Daily  . losartan  100 mg Oral Daily  . pantoprazole  80 mg Oral Daily  . warfarin  5 mg Oral ONCE-1800  . Warfarin - Pharmacist Dosing Inpatient   Does not apply q1800   Continuous Infusions: . sodium chloride Stopped (12/08/17 1730)  . methocarbamol (ROBAXIN)  IV    . methocarbamol (ROBAXIN)  IV Stopped (12/07/17 1127)     Radiological Exams on Admission: Dg Chest Port 1 View  Result Date: 12/09/2017 CLINICAL DATA:  Wheezing hypertension EXAM: PORTABLE CHEST 1 VIEW COMPARISON:  09/15/2015 FINDINGS: Postsurgical changes in the cervical spine. Moderate cardiomegaly with vascular  congestion. No acute infiltrate or effusion. No pneumothorax. IMPRESSION: Stable cardiomegaly with mild central congestion. No focal pulmonary infiltrate. Electronically Signed   By: Donavan Foil M.D.   On: 12/09/2017 00:11   Dg Knee Right Port  Result Date: 12/07/2017 CLINICAL DATA:  Total knee replacement. EXAM: PORTABLE RIGHT KNEE - 1-2 VIEW COMPARISON:  05/28/2017. FINDINGS: Total right knee replacement. Hardware intact. Anatomic alignment. No acute bony abnormality. Peripheral vascular calcification. IMPRESSION: 1.  Total right knee replacement anatomic alignment. 2.  Peripheral vascular disease. Electronically Signed   By: Marcello Moores  Register   On: 12/07/2017 12:01    Impression/Recommendations Principal Problem:   Unilateral primary osteoarthritis, right knee Active Problems:   ESOPHAGEAL STRICTURE   Status post total right knee replacement  1. Episode of dysphagia to solid foods - 1. Sounds like after drinking water, multiple episodes of vomiting, drinking carbonated beverage.  This episode has resolved. 2. Has been drinking water without recurrent vomiting 3. Will put patient on clear liquid diet for the moment 4. May wish to call [facility] in AM given extensive history of esophageal strictures in past, food impaction in past, etc (see HPI). 2. Wheezing post dysphagia episode - 1. Is possible he may have had small amount of aspiration with all the vomiting; however, 2. CXR negative for acute infiltrate 3. Will order neb treatment x1 now 4. And see how he responds overnight 3. S/p TKA - Ortho primary   Thank you for this consultation.  Our [facility] hospitalist team will follow the patient with you.   Amaryah Mallen M. D.O. [facility] 12/09/2017, 12:29 AM

## 2017-12-09 NOTE — Progress Notes (Signed)
Subjective: 2 Days Post-Op Procedure(s) (LRB): RIGHT TOTAL KNEE ARTHROPLASTY (Right) Patient reports pain as moderate.  Did have a swallowing issue yesterday and apparently a past history of an esophageal stricture.  Feels better today. Also required a breathing treatment.  Objective: Vital signs in last 24 hours: Temp:  [97.6 F (36.4 C)-98.5 F (36.9 C)] 98.4 F (36.9 C) (02/24 0626) Pulse Rate:  [68-78] 71 (02/24 0626) Resp:  [2-20] 2 (02/24 0626) BP: (101-136)/(52-70) 108/52 (02/24 0626) SpO2:  [95 %-100 %] 98 % (02/24 0626)  Intake/Output from previous day: 02/23 0701 - 02/24 0700 In: 1408.4 [P.O.:600; I.V.:808.4] Out: 600 [Urine:600] Intake/Output this shift: No intake/output data recorded.  Recent Labs    12/08/17 0609  HGB 12.6*   Recent Labs    12/08/17 0609  WBC 16.5*  RBC 3.97*  HCT 38.3*  PLT 186   Recent Labs    12/08/17 0609  NA 137  K 4.1  CL 102  CO2 22  BUN 14  CREATININE 0.95  GLUCOSE 186*  CALCIUM 8.3*   Recent Labs    12/08/17 0609 12/09/17 0553  INR 1.07 1.34    Sensation intact distally Intact pulses distally Dorsiflexion/Plantar flexion intact Incision: dressing C/D/I No cellulitis present Compartment soft  Assessment/Plan: 2 Days Post-Op Procedure(s) (LRB): RIGHT TOTAL KNEE ARTHROPLASTY (Right) Up with therapy Plan for discharge tomorrow Discharge to SNF  I spoke with his daughter in detail and they feel more comfortable with him going to short-term skilled nursing for a little extra rehab.  Mcarthur Rossetti 12/09/2017, 9:35 AM

## 2017-12-09 NOTE — Plan of Care (Signed)
Tough night with patient experiencing "spitting up" after dinner with a feeling that food was stuck in his esophagus.  He did not ambulate in hall as we had hoped and was really just focused on this food issue.  PA on call was contacted and orders were received.

## 2017-12-09 NOTE — Consult Note (Signed)
Reason for Consult: Dysphagia resolved food impaction Referring Physician: Orthopedic team  Brent Mason is an 72 y.o. male.  HPI: Patient with a long history of episodic dysphagia and has had periodic dilations in the past and the last one was by Dr. Fuller Plan in 2003 and he knows him by name but his partner would not see the patient So I was asked to see the patient and he says his last episode was over a year ago and he does not have any other GI complaints and he is on Prilosec at home and he had his last colonoscopy in [place] which was reviewed and his case was discussed with his family and he was nervous last night while eating and he took too big of a bite and ate it too fast but he feels fine now is tolerating clear liquids and can swallow his pills and he has no other complaints and his hospital computer chart was reviewed Past Medical History:  Diagnosis Date  . Anemia   . Anticoagulated on Coumadin    long-term  . Chronic kidney disease    frequent uti's last uti few yrs ago  . Diastolic CHF, chronic ([place])   . Dysrhythmia   . GERD (gastroesophageal reflux disease)   . Headache   . Hiatal hernia   . History of adenomatous polyp of colon   . History of esophageal stricture    s/p  dilatations  . History of GI bleed    03/ 2003  per EGD gastritis  . HOH (hard of hearing)    both ears  . Hypertension   . Non-rheumatic aortic stenosis    per last echo 12-14-2015    . OA (osteoarthritis)    oa  . Persistent atrial fibrillation [facility]) first dx  1999   primary cardiologist-  dr taylor-- hx DCCV  . Pneumonia 2009   with cpap usage  . Shortness of breath    when exertion  . Sleep apnea    last sleep study > 5 yrs   no cpap used    Past Surgical History:  Procedure Laterality Date  . ABDOMINAL HERNIA REPAIR  1990s  . ANTERIOR CERVICAL DECOMP/DISCECTOMY FUSION N/A 02/14/2013   Procedure: ANTERIOR CERVICAL DECOMPRESSION/DISCECTOMY FUSION 2 LEVELS;  Surgeon: Charlie Pitter,  MD;  Location: [place] [facility] OR[facility];  Service: Neurosurgery;  Laterality: N/A;  . COLONOSCOPY  last one 06-03-2012  . COLONOSCOPY  06/03/2012   Procedure: COLONOSCOPY;  Surgeon: Daneil Dolin, MD;  [facility];  Service: Endoscopy;  Laterality: N/A;  8:30  . esophageal strecching  yrs ago  . ESOPHAGUS SURGERY  20 yrs ago   for food sticking  . FOOT SURGERY Right 04-27-2010   dr duda   joint arthrodesis  . right foot surgery  2018   to remove screw  . TOTAL KNEE ARTHROPLASTY Right 12/07/2017   Procedure: RIGHT TOTAL KNEE ARTHROPLASTY;  Surgeon: Mcarthur Rossetti, MD;  Location: [facility];  Service: Orthopedics;  Laterality: Right;  . TRANSTHORACIC ECHOCARDIOGRAM  12-14-2015  dr taylor   moderate LVH, ef 55-60%, indeterminate diastolic function in setting of AFib/  moderate LAE and RAE/  mild to moderate calcific aoric stenosis with mild AR (valve area 1.33cm^2, peak grandient62mHg, mean grandient 170mg)/ trivial MR/ mild TR/ PASP 3329m  . VOLVULUS REDUCTION  [id number] Family History  Problem Relation Age of Onset  . Heart disease Mother        Pacemaker  .  Diabetes Father   . Colon cancer Neg Hx     Social History:  reports that  has never smoked. he has never used smokeless tobacco. He reports that he does not drink alcohol or use drugs.  Allergies: No Known Allergies  Medications: I have reviewed the patient's current medications.  Results for orders placed or performed during the hospital encounter of 12/07/17 (from the past 48 hour(s))  Protime-INR     Status: None   Collection Time: 12/08/17  6:09 AM  Result Value Ref Range   Prothrombin Time 13.8 11.4 - 15.2 seconds   INR 1.07     Comment: Performed at [facility], [place] [address], [place], [place] [address]  CBC     Status: Abnormal   Collection Time: 12/08/17  6:09 AM  Result Value Ref Range   WBC 16.5 (H) 4.0 - 10.5 K/uL   RBC 3.97 (L) 4.22 - 5.81 MIL/uL   Hemoglobin 12.6 (L) 13.0 -  17.0 g/dL   HCT 38.3 (L) 39.0 - 52.0 %   MCV 96.5 78.0 - 100.0 fL   MCH 31.7 26.0 - 34.0 pg   MCHC 32.9 30.0 - 36.0 g/dL   RDW 12.5 11.5 - 15.5 %   Platelets 186 150 - 400 K/uL    Comment: Performed at [facility], [place] [address] [place], [place] [address]  Basic metabolic panel     Status: Abnormal   Collection Time: 12/08/17  6:09 AM  Result Value Ref Range   Sodium 137 135 - 145 mmol/L   Potassium 4.1 3.5 - 5.1 mmol/L   Chloride 102 101 - 111 mmol/L   CO2 22 22 - 32 mmol/L   Glucose, Bld 186 (H) 65 - 99 mg/dL   BUN 14 6 - 20 mg/dL   Creatinine, Ser 0.95 0.61 - 1.24 mg/dL   Calcium 8.3 (L) 8.9 - 10.3 mg/dL   GFR calc non Af Amer >60 >60 mL/min   GFR calc Af Amer >60 >60 mL/min    Comment: (NOTE) The eGFR has been calculated using the CKD EPI equation. This calculation has not been validated in all clinical situations. eGFR's persistently <60 mL/min signify possible Chronic Kidney Disease.    Anion gap 13 5 - 15    Comment: Performed at [facility], [place] [address] [place], [place] [address]  Protime-INR     Status: Abnormal   Collection Time: 12/09/17  5:53 AM  Result Value Ref Range   Prothrombin Time 16.5 (H) 11.4 - 15.2 seconds   INR 1.34     Comment: Performed at [facility], [place] [address] [place], [place] [address]    Dg Chest Port 1 View  Result Date: 12/09/2017 CLINICAL DATA:  Wheezing hypertension EXAM: PORTABLE CHEST 1 VIEW COMPARISON:  09/15/2015 FINDINGS: Postsurgical changes in the cervical spine. Moderate cardiomegaly with vascular congestion. No acute infiltrate or effusion. No pneumothorax. IMPRESSION: Stable cardiomegaly with mild central congestion. No focal pulmonary infiltrate. Electronically Signed   By: Donavan Foil M.D.   On: 12/09/2017 00:11    ROS negative except above Blood pressure (!) 108/52, pulse 71, temperature 98.4 F (36.9 C), temperature source Axillary, resp. rate (!) 2,  height 6' 2"  (1.88 m), weight 125.6 kg (277 lb), SpO2 98 %. Physical Exam vital signs stable afebrile no acute distress patient sitting comfortably in the chair lungs are clear heart regular rate and rhythm abdomen is soft nontender obvious ventral hernia and  surgical scars slight increased white count other labs stable  Assessment/Plan: Dysphagia in a patient status post knee replacement just restarting Coumadin Plan: Soft solids okay I offered him doing nothing and seeing how it goes and following up with his old or new GI doctors or I offered him an endoscopy tomorrow with dilation if needed or a barium swallow and after our discussion with he and the family we have elected to proceed with the barium swallow and barium tablet just to be sure and if no significant abnormality okay to advance diet further and GI follow-up as needed and would continue pump inhibitors  Othella Slappey E 12/09/2017, 1:44 PM

## 2017-12-09 NOTE — Progress Notes (Signed)
PROGRESS NOTE    Brent Mason  KNL:[id number] DOB: 03-Jul-1946 DOA: 12/07/2017 PCP: Redmond School, MD    Brief Narrative:  72 year old male who developed dysphagia.  He was admitted to the hospital on February 22 for a right total knee arthroplasty.  Patient had an acute episode of dysphagia with vomiting, improved with supportive therapy.  Apparently he does have a history of esophageal strictures in the past, requiring endoscopic procedures.  Physical examination blood pressure 121/66, heart rate 73, respiratory 18, temperature 98.6, oxygen saturation 100%.  Head moist mucous membranes, lungs are clear to auscultation bilaterally, heart S1-S2 present rhythmic, the abdomen was soft nontender, no lower extremity edema.   Patient been evaluated for a possible esophageal stricture.  Assessment & Plan:   Principal Problem:   Unilateral primary osteoarthritis, right knee Active Problems:   ESOPHAGEAL STRICTURE   Status post total right knee replacement   1.  Esophageal stricture. No further symptoms on a clear liquid diet, will continue to advance diet as tolerated. Patient will have esophagogram in am. Further workup likely as outpatient, follow with GI recommendations. No further dyspnea or signs of aspiration, personally reviewed chest film, noted increased lung marking, cardiomegaly but no infiltrates.   Will sing off, please call with questions.     DVT prophylaxis: enoxaparin   Code Status:  full Family Communication: no family at the bedside Disposition Plan: snf   Consultants:     Procedures:     Antimicrobials:       Subjective: Patient is feeling well, tolerating po diet with no major complications, no nausea or vomiting, no dyspnea or chest pain.   Objective: Vitals:   12/08/17 1402 12/08/17 1830 12/08/17 2337 12/09/17 0626  BP: 123/70 101/65 136/69 (!) 108/52  Pulse: 68 78 76 71  Resp: 20 18 19  (!) 2  Temp: 97.7 F (36.5 C) 98.5 F (36.9 C) 98.5 F  (36.9 C) 98.4 F (36.9 C)  TempSrc: Oral Oral Oral Axillary  SpO2: 97% 100% 95% 98%  Weight:      Height:        Intake/Output Summary (Last 24 hours) at 12/09/2017 1218 Last data filed at 12/09/2017 0900 Gross per 24 hour  Intake 950.92 ml  Output 900 ml  Net 50.92 ml   Filed Weights   12/07/17 0705  Weight: 125.6 kg (277 lb)    Examination:   General: Not in pain or dyspnea, deconditioned Neurology: Awake and alert, non focal  E ENT: mild pallor, no icterus, oral mucosa moist Cardiovascular: No JVD. S1-S2 present, rhythmic, no gallops, rubs, or murmurs. No lower extremity edema. Pulmonary: vesicular breath sounds bilaterally, adequate air movement, no wheezing, rhonchi or rales. Gastrointestinal. Abdomen protuberant, no organomegaly, non tender, no rebound or guarding Skin. No rashes Musculoskeletal: no joint deformities     Data Reviewed: I have personally reviewed following labs and imaging studies  CBC: Recent Labs  Lab 12/08/17 0609  WBC 16.5*  HGB 12.6*  HCT 38.3*  MCV 96.5  PLT 790   Basic Metabolic Panel: Recent Labs  Lab 12/08/17 0609  NA 137  K 4.1  CL 102  CO2 22  GLUCOSE 186*  BUN 14  CREATININE 0.95  CALCIUM 8.3*   GFR: Estimated Creatinine Clearance: 99 mL/min (by C-G formula based on SCr of 0.95 mg/dL). Liver Function Tests: No results for input(s): AST, ALT, ALKPHOS, BILITOT, PROT, ALBUMIN in the last 168 hours. No results for input(s): LIPASE, AMYLASE in the last 168 hours. No  results for input(s): AMMONIA in the last 168 hours. Coagulation Profile: Recent Labs  Lab 12/07/17 0759 12/08/17 0609 12/09/17 0553  INR 1.16 1.07 1.34   Cardiac Enzymes: No results for input(s): CKTOTAL, CKMB, CKMBINDEX, TROPONINI in the last 168 hours. BNP (last 3 results) No results for input(s): PROBNP in the last 8760 hours. HbA1C: No results for input(s): HGBA1C in the last 72 hours. CBG: No results for input(s): GLUCAP in the last 168  hours. Lipid Profile: No results for input(s): CHOL, HDL, LDLCALC, TRIG, CHOLHDL, LDLDIRECT in the last 72 hours. Thyroid Function Tests: No results for input(s): TSH, T4TOTAL, FREET4, T3FREE, THYROIDAB in the last 72 hours. Anemia Panel: No results for input(s): VITAMINB12, FOLATE, FERRITIN, TIBC, IRON, RETICCTPCT in the last 72 hours.    Radiology Studies: I have reviewed all of the imaging during this hospital visit personally     Scheduled Meds: . atorvastatin  10 mg Oral q1800  . carvedilol  3.125 mg Oral BID WC  . docusate sodium  100 mg Oral BID  . ferrous sulfate  325 mg Oral Q breakfast  . fluticasone  1 spray Each Nare Daily  . furosemide  40 mg Oral Daily  . losartan  100 mg Oral Daily  . pantoprazole  80 mg Oral Daily  . warfarin  2.5 mg Oral Once  . Warfarin - Pharmacist Dosing Inpatient   Does not apply q1800   Continuous Infusions: . sodium chloride Stopped (12/08/17 1730)  . methocarbamol (ROBAXIN)  IV Stopped (12/07/17 1127)     LOS: 2 days        Mauricio Gerome Apley, MD [facility] Pager [phone]

## 2017-12-09 NOTE — Progress Notes (Signed)
Occupational Therapy Treatment Patient Details Name: LEON MONTOYA MRN: [id number] DOB: 1946-09-18 Today's Date: 12/09/2017    History of present illness Pt is a 72 year old male s/p R TKA   OT comments  Will further address ADL's next OT session.  Pt in agreement  Follow Up Recommendations  Supervision/Assistance - 24 hour;SNF;Home health OT    Equipment Recommendations  None recommended by OT    Recommendations for Other Services      Precautions / Restrictions Precautions Precautions: Knee;Fall Precaution Comments: KI not utilized as pt was able to perform IND SLRs Required Braces or Orthoses: Knee Immobilizer - Right Knee Immobilizer - Right: Discontinue once straight leg raise with < 10 degree lag Restrictions Weight Bearing Restrictions: No Other Position/Activity Restrictions: WBAT       Mobility Bed Mobility               General bed mobility comments: pt received in chair  Transfers Overall transfer level: Needs assistance Equipment used: Rolling walker (2 wheeled) Transfers: Sit to/from Stand Sit to Stand: Min assist         General transfer comment: VC for LE and UE positioing    Balance                                           ADL either performed or assessed with clinical judgement   ADL Overall ADL's : Needs assistance/impaired Eating/Feeding: Set up;Sitting   Grooming: Set up;Sitting       Lower Body Bathing: Maximal assistance;Sit to/from stand;Cueing for safety;Cueing for sequencing       Lower Body Dressing: Maximal assistance;Sit to/from stand;Cueing for safety;Cueing for compensatory techniques                 General ADL Comments: will benefit from AE. Pt agreed to this next OT session     Vision Baseline Vision/History: Wears glasses Wears Glasses: At all times     Perception     Praxis      Cognition Arousal/Alertness: Awake/alert Behavior During Therapy: WFL for tasks  assessed/performed Overall Cognitive Status: Within Functional Limits for tasks assessed                                                General Comments      Pertinent Vitals/ Pain       Pain Assessment: 0-10 Pain Score: 6  Pain Location: R knee/ankle Pain Descriptors / Indicators: Sore;Aching Pain Intervention(s): Limited activity within patient's tolerance;Repositioned;Ice applied;Monitored during session;Patient requesting pain meds-RN notified         Frequency  Min 2X/week        Progress Toward Goals  OT Goals(current goals can now be found in the care plan section)  Progress towards OT goals: OT to reassess next treatment  Acute Rehab OT Goals Patient Stated Goal: decreased pain  Plan Discharge plan needs to be updated    Co-evaluation                 AM-PAC PT "6 Clicks" Daily Activity     Outcome Measure   Help from another person eating meals?: None Help from another person taking care of personal grooming?: A Little Help from another person toileting, which  includes using toliet, bedpan, or urinal?: A Lot Help from another person bathing (including washing, rinsing, drying)?: A Little Help from another person to put on and taking off regular upper body clothing?: A Little Help from another person to put on and taking off regular lower body clothing?: A Lot 6 Click Score: 17    End of Session    OT Visit Diagnosis: Unsteadiness on feet (R26.81);Pain Pain - Right/Left: Right Pain - part of body: Knee;Ankle and joints of foot   Activity Tolerance Patient limited by pain   Patient Left in bed;with call bell/phone within reach;with family/visitor present;with nursing/sitter in room   Nurse Communication Patient requests pain meds        Time: 9179-1505 OT Time Calculation (min): 10 min  Charges: OT Evaluation $OT Eval Low Complexity: 1 Low OT Treatments $Self Care/Home Management : 8-22 mins  Kari Baars,  [place]   Payton Mccallum D 12/09/2017, 1:34 PM

## 2017-12-09 NOTE — Care Management Note (Signed)
Case Management Note  Patient Details  Name: Brent Mason MRN: [id number] Date of Birth: 1946-01-08  Subjective/Objective:     S/p Right TKA               Action/Plan: Discharge Planning: NCM spoke to pt and dtr, [name]. Dtr states she and her husband will be at home to assist pt at dc. [facility] delivered RW and 3n1 to room and family has taken home. Offered choice for HH/list provided. Dtr agreeable to [facility]. Dtr requesting [facility] for INR check and aide to assist with bathing, Left message for attending for [facility] order. Contacted [facility] to update.    Expected Discharge Date:                  Expected Discharge Plan:  [place]  In-House Referral:  NA  Discharge planning Services  CM Consult  Post Acute Care Choice:  Home Health Choice offered to:  Patient  DME Arranged:  3-N-1, Walker rolling DME Agency:  [place]:  PT, Nurse's Aide [place] Agency:  [facility] (formerly [employer])  Status of Service:  In process, will continue to follow  If discussed at Long Length of Stay Meetings, dates discussed:    Additional Comments:  Erenest Rasher, RN 12/09/2017, 4:08 PM

## 2017-12-09 NOTE — Progress Notes (Signed)
12/09/17 1400  PT Visit Information  Last PT Received On 12/09/17 Pt continues to progress well; dtr present at this time seems comfortable with d/c home and she will be the one assisting pt; will see in am--family education if pt to d/c home and not SNF  Assistance Needed +1  History of Present Illness Pt is a 72 year old male s/p R TKA  Subjective Data  Patient Stated Goal decreased pain  Precautions  Precautions Knee;Fall  Precaution Comments KI use reviewed with pt and dtr, KI utilized this pm as pt iwthincr pain and therefore incr qaud lag  Required Braces or Orthoses Knee Immobilizer - Right  Knee Immobilizer - Right Discontinue once straight leg raise with < 10 degree lag  Restrictions  Weight Bearing Restrictions No  Other Position/Activity Restrictions WBAT  Pain Assessment  Pain Assessment 0-10  Pain Score 9  Pain Location R knee/ankle  Pain Descriptors / Indicators Sore;Aching  Pain Intervention(s) Limited activity within patient's tolerance;Monitored during session;Premedicated before session;Ice applied  Cognition  Arousal/Alertness Awake/alert  Behavior During Therapy WFL for tasks assessed/performed  Overall Cognitive Status Within Functional Limits for tasks assessed  Bed Mobility  General bed mobility comments pt received in chair; did practice bed mobility--pt states he often sleeps in recliner at home  Transfers  Overall transfer level Needs assistance  Equipment used Rolling walker (2 wheeled)  Transfers Sit to/from Stand  Sit to Stand Supervision  General transfer comment VC for LE and UE positioing  Ambulation/Gait  Ambulation/Gait assistance Min guard;Supervision  Ambulation Distance (Feet) 100 Feet  Assistive device Rolling walker (2 wheeled)  Gait Pattern/deviations Decreased stance time - right;Step-to pattern;Antalgic  General Gait Details pt positions RLE in external rotation (this is his baseline since ankle surgery many years ago), cues for step  length, posture, pt with improved wt shift to RLE  Total Joint Exercises  Ankle Circles/Pumps AROM;Both;10 reps  Straight Leg Raises AAROM;Right;10 reps  PT - End of Session  Equipment Utilized During Treatment Gait belt  Activity Tolerance Patient tolerated treatment well  Patient left with call bell/phone within reach;in chair;with family/visitor present;with chair alarm set  PT - Assessment/Plan  PT Plan Current plan remains appropriate  PT Visit Diagnosis Other abnormalities of gait and mobility (R26.89)  PT Frequency (ACUTE ONLY) 7X/week  Follow Up Recommendations Home health PT;Follow surgeon's recommendation for DC plan and follow-up therapies;SNF  PT equipment Rolling walker with 5" wheels  AM-PAC PT "6 Clicks" Daily Activity Outcome Measure  Difficulty turning over in bed (including adjusting bedclothes, sheets and blankets)? 2  Difficulty moving from lying on back to sitting on the side of the bed?  1  Difficulty sitting down on and standing up from a chair with arms (e.g., wheelchair, bedside commode, etc,.)? 3  Help needed moving to and from a bed to chair (including a wheelchair)? 3  Help needed walking in hospital room? 3  Help needed climbing 3-5 steps with a railing?  2  6 Click Score 14  Mobility G Code  CK  PT Goal Progression  Progress towards PT goals Progressing toward goals  Acute Rehab PT Goals  PT Goal Formulation With patient  Time For Goal Achievement 12/11/17  Potential to Achieve Goals Good  PT Time Calculation  PT Start Time (ACUTE ONLY) 1352  PT Stop Time (ACUTE ONLY) 1437  PT Time Calculation (min) (ACUTE ONLY) 45 min  PT General Charges  $$ ACUTE PT VISIT 1 Visit  PT Treatments  $  Gait Training 23-37 mins  $Self Care/Home Management 8-22  [place] Ana, [place] Pager: [phone] 12/09/2017

## 2017-12-09 NOTE — Progress Notes (Signed)
ANTICOAGULATION CONSULT NOTE Pharmacy Consult for warfarin Indication: VTE prophylaxis on PTA for afib  No Known Allergies  Patient Measurements: Height: 6\' 2"  (188 cm) Weight: 277 lb (125.6 kg) IBW/kg (Calculated) : 82.2   Vital Signs: Temp: 98.4 F (36.9 C) (02/24 0626) Temp Source: Axillary (02/24 0626) BP: 108/52 (02/24 0626) Pulse Rate: 71 (02/24 0626)  Labs: Recent Labs    12/07/17 0759 12/08/17 0609 12/09/17 0553  HGB  --  12.6*  --   HCT  --  38.3*  --   PLT  --  186  --   APTT 31  --   --   LABPROT 14.7 13.8 16.5*  INR 1.16 1.07 1.34  CREATININE  --  0.95  --     Estimated Creatinine Clearance: 99 mL/min (by C-G formula based on SCr of 0.95 mg/dL).   Assessment: 72 yo male admitted for scheduled TKA.  Pt take warfarin for hx of atrial fibrillation.  Home dose 5mg  on Mon, Wed, Fri and 2.5mg  all other days.  Pharmacy consulted to dose warfarin.  Last dose 11/30/17 12/09/2017 INR 1.34, Hg 12.6, PLTC 185 ( on 2/23) no bleeding reported Coumadin 5 mg due at 1800 last night given at  0250 am 2nd pt refused dose at 1800  Goal of Therapy:  INR 2-3 Monitor platelets by anticoagulation protocol:   Plan:  Warfarin 2.5 mg po x 1 at 2200 (2nd to pt getting 5 mg at 03 am this morning) Daily INR Consider adding LMWH bridge tx until INR tx  Eudelia Bunch, Pharm.D. [phone] 12/09/2017 11:05 AM

## 2017-12-10 ENCOUNTER — Inpatient Hospital Stay (HOSPITAL_COMMUNITY): Payer: Medicare Other

## 2017-12-10 LAB — PROTIME-INR
INR: 1.47
PROTHROMBIN TIME: 17.7 s — AB (ref 11.4–15.2)

## 2017-12-10 MED ORDER — OXYCODONE HCL 5 MG PO TABS: 5.0000 mg | ORAL_TABLET | ORAL | 0 refills | Status: DC | PRN

## 2017-12-10 MED ORDER — METHOCARBAMOL 500 MG PO TABS: 500.0000 mg | ORAL_TABLET | Freq: Four times a day (QID) | ORAL | 0 refills | Status: AC | PRN

## 2017-12-10 MED ORDER — WARFARIN SODIUM 5 MG PO TABS: 5.0000 mg | ORAL_TABLET | Freq: Once | ORAL | Status: DC

## 2017-12-10 NOTE — Op Note (Signed)
NAME:  Brent, Mason NO.:  [id number]  MEDICAL RECORD NO.:  [id number]  LOCATION:                                 FACILITY:  PHYSICIAN:  Lind Guest. Ninfa Linden, M.D.DATE OF BIRTH:  DATE OF PROCEDURE:  12/07/2017 DATE OF DISCHARGE:                              OPERATIVE REPORT   PREOPERATIVE DIAGNOSIS:  Primary osteoarthritis and degenerative joint disease, right knee.  POSTOPERATIVE DIAGNOSIS:  Primary osteoarthritis and degenerative joint disease, right knee.  PROCEDURE:  Right total knee arthroplasty.  IMPLANTS:  Stryker Triathlon knee system with size 6 cemented femur, size 6 cemented universal tibial baseplate, size 11 fixed-bearing polyethylene insert, size 35 cemented patellar button.  SURGEON:  Lind Guest. Ninfa Linden, M.D.  ASSISTANT:  Erskine Emery, PA-C.  ANESTHESIA: 1. Right lower extremity adductor canal block. 2. General. 3. Local with mixture of Exparel and saline.  ANTIBIOTICS:  2 g of IV Ancef.  TOURNIQUET TIME:  47 minutes.  BLOOD LOSS:  Less than 100 cc.  COMPLICATIONS:  None.  INDICATIONS:  [name] is a 72 year old gentleman with debilitating arthritis involving his right knee.  We have seen him for many years now and his pain has become daily.  It has detrimentally affected his activities of daily living, his quality of life, and his mobility.  He has tried and failed all forms of conservative treatment.  At this point, he does wish to proceed with total knee arthroplasty.  He understands the risk of acute blood loss anemia, nerve and vessel injury, fracture, infection, and DVT.  He understands our goals are to decrease pain, improve mobility and overall improved quality of life.  PROCEDURE DESCRIPTION:  After informed consent was obtained, appropriate right knee was marked.  An adductor canal block was placed in the holding room and he was brought to the operating room, placed supine on the operating table, general  anesthesia was then obtained.  A nonsterile tourniquet was placed around his upper right thigh, and his right leg was prepped and draped from the thigh down the ankle with DuraPrep and sterile drapes including a sterile stockinette.  Time-out was called and he was identified as correct patient and correct right knee.  We then used an Esmarch to wrap out the leg and tourniquet was inflated to 300 mm of pressure.  We then made a midline incision over the patella and carried this proximally and distally.  We dissected the knee joint and carried out a medial parapatellar arthrotomy finding a large joint effusion and significant arthritis in all three compartments of his knee.  With the knee in a flexed position, we set our extramedullary cutting guide for taking 9 mm off the high side correcting for varus and valgus and neutral slope.  We made our tibia cut without difficulty.  We removed remnants of the ACL, PCL, medial and lateral meniscus and then used the intramedullary guide through the notch of the femur, setting this for distal femoral cut of 10 mm for right knee at 5 degrees externally rotated.  We made this cut without difficulty and brought the knee back down to full extension.  With a 9-mm extension block, had achieved full extension.  We then went back to the femur and put our femoral sizing guide based off the epicondylar axis.  We chose a size 6 femur based off this.  We placed our 4-in-1 cutting block for a size 6 femur, made our anterior and posterior cuts followed by our chamfer cuts.  We then made our femoral box cut.  We then went back to the tibia and chose a size 6 tibia for coverage on the tibia itself.  We set our rotation off the tibia tubercle and the femur and did a drill for universal baseplate and the keel punch as well.  With the size 6 tibial tray followed by the size 6 femur, we placed a 9-mm and then 11-mm fix- bearing polyethylene insert, and leg length,  stability more with 11-mm insert.  We then made our patellar cut and drilled 3 holes for a size 35 patellar button.  We then removed all instrumentation from the knee and irrigated the knee with normal saline solution.  I then placed our mixture of Exparel and saline around the joint capsule.  We then irrigated the knee with normal saline solution again and dried the knee off.  We mixed our cement and then cemented the real Stryker Triathlon universal baseplate, size 6 followed by the real size 6 femur.  We placed our real fix-bearing polyethylene insert, size 11 and removed cement debris from the knee and then cemented our patellar button.  Once the cement had hardened, we let the tourniquet down and hemostasis was obtained with electrocautery.  We removed the cement debris from the knee, and then irrigated the knee again with normal saline solution.  We then closed the arthrotomy with interrupted #1 Vicryl suture followed by 0 Vicryl in the deep tissue, 2-0 Vicryl in the subcutaneous tissue, 4-0 Monocryl subcuticular stitch and Steri-Strips on the skin.  Well-padded sterile dressing was applied.  He was awakened, extubated, and taken to the recovery room in stable condition.  All final counts were correct. There were no complications noted.  Of note, Erskine Emery, PA-C assisted in the entire case.  His assistance was crucial for facilitating all aspects of this case.     Lind Guest. Ninfa Linden, M.D.     CYB/MEDQ  D:  12/07/2017  T:  12/07/2017  Job:  [id number]

## 2017-12-10 NOTE — Progress Notes (Signed)
Patient ID: Brent Mason, male   DOB: 04-Jan-1946, 72 y.o.   MRN: [id number] Having swallow study today per GI consult.  Then can eat.  When I spoke with family yesterday am, they want patient to go to short-term skilled nursing.  May have changed to having him go home.  Can eat after study today and can potentially be discharged this afternoon.  Nursing will have to left [place] know.

## 2017-12-10 NOTE — Progress Notes (Signed)
Per Dr.Magod, to hold today and tomorrows dose of Coumadin. Patient is allowed to have soft diet today and clear liquids in the am. GI is taking patient to have a esophageal dilation tomorrow at 13:00.

## 2017-12-10 NOTE — Progress Notes (Signed)
Brent Mason 1:16 PM  Subjective: Patient without any GI complaints however his knee is still bothering him and we reviewed his barium swallow and he has no new complaints  Objective: Vital signs stable afebrile exam unchanged INR 1.47 barium swallow reviewed Assessment: Dysphagia with abnormal barium swallow  Plan: I offered him an outpatient endoscopy when the knee is better but he would like for me to proceed tomorrow and the risks benefits methods was discussed with he and his daughter and will hold Coumadin today and proceed tomorrow at 1 PM and if no obvious problem can probably go home afterwards  [facility]  Pager [phone] After 5PM or if no answer call [phone]

## 2017-12-10 NOTE — Progress Notes (Signed)
ANTICOAGULATION CONSULT NOTE Pharmacy Consult for warfarin Indication: VTE prophylaxis on PTA for afib  No Known Allergies  Patient Measurements: Height: 6\' 2"  (188 cm) Weight: 277 lb (125.6 kg) IBW/kg (Calculated) : 82.2   Vital Signs: Temp: 98.1 F (36.7 C) (02/25 0415) Temp Source: Oral (02/25 0415) BP: 135/82 (02/25 0415) Pulse Rate: 82 (02/25 0415)  Labs: Recent Labs    12/08/17 0609 12/09/17 0553 12/10/17 0613  HGB 12.6*  --   --   HCT 38.3*  --   --   PLT 186  --   --   LABPROT 13.8 16.5* 17.7*  INR 1.07 1.34 1.47  CREATININE 0.95  --   --     Estimated Creatinine Clearance: 99 mL/min (by C-G formula based on SCr of 0.95 mg/dL).   Assessment: 72 yo male admitted for scheduled TKA.  Pt take warfarin for hx of atrial fibrillation.  Home dose 5mg  on Mon, Wed, Fri and 2.5mg  all other days.  Pharmacy consulted to dose warfarin.    12/10/2017 INR 1.47, no bleeding reported Coumadin 5 mg due at 1800 last night given at  2302 , 2nd pt refused dose at 1800  Goal of Therapy:  INR 2-3 Monitor platelets by anticoagulation protocol:   Plan:  Warfarin 5 mg po x 1 at 1800 Daily INR Consider adding LMWH bridge tx until INR tx  Dolly Rias RPh 12/10/2017, 11:21 AM Pager [phone]

## 2017-12-10 NOTE — Progress Notes (Signed)
CSW met with patient and daughter at bedside. Physical Therapist present at time. CSW discussed SNF options and need for insurance approval. Patient daughter is concern the patient will not like facility option or insurance will not approve days because he is progressing with physical therapy.  Patient reports he prefers to go home and feels safe at this time to do so.  Patient daughter is agreeable to take the patient home. She reports she and her spouse will be there to support the patient during the transition home.  No other CSW needs identified. CSW will sign off.   Kathrin Greathouse, Latanya Presser, MSW Clinical Social Worker  [phone] 12/10/2017  1:51 PM

## 2017-12-10 NOTE — Progress Notes (Signed)
12/10/17 1352  PT Visit Information  Pt continues to progress well, he is at supervision to mod I level; he seems mentally fatigued from all the back and forth with regard to his d/c plan and his family;  Discussion with pt, dtrs and SW-- at time of conversation family is agreeable to d/c home with HHPT/OT;  PT in agreement with plan for d/c home since pt continues to progress toward goals; Will continue to follow in acute setting; pt dtr plans to assist for 1 wk after pt d/c'd home;   Last PT Received On 12/10/17  Assistance Needed +1  History of Present Illness Pt is a 72 year old male s/p R TKA  Subjective Data  Patient Stated Goal less pain, go home  Precautions  Precautions Knee  Precaution Comments able to perform SLRs however KI used d/t quad lag  Required Braces or Orthoses Knee Immobilizer - Right  Knee Immobilizer - Right Discontinue once straight leg raise with < 10 degree lag  Restrictions  Other Position/Activity Restrictions WBAT  Pain Assessment  Pain Assessment 0-10  Pain Score 5  Pain Location right knee  Pain Descriptors / Indicators Sore;Aching  Pain Intervention(s) Limited activity within patient's tolerance;Monitored during session;Ice applied  Cognition  Arousal/Alertness Awake/alert  Behavior During Therapy WFL for tasks assessed/performed  Overall Cognitive Status Within Functional Limits for tasks assessed  Bed Mobility  Overal bed mobility Needs Assistance  Bed Mobility Sit to Supine  Sit to supine Supervision;Modified independent (Device/Increase time)  General bed mobility comments incr time, supervision d/t elevated bed ht to simulate home  Transfers  Overall transfer level Needs assistance  Equipment used Rolling walker (2 wheeled)  Transfers Sit to/from Stand  Sit to Stand Supervision  General transfer comment VCs to control descent and for hand placement  Ambulation/Gait  Ambulation/Gait assistance Supervision  Ambulation Distance (Feet) 80  Feet  Assistive device Rolling walker (2 wheeled)  Gait Pattern/deviations Step-to pattern;Step-through pattern  General Gait Details cues for sequence and posture, pt is steady with use of RW,no LOB noted; he is abl eto amb in adn out of bathroom with distant supervision for safety  Balance  Overall balance assessment Needs assistance  Sitting-balance support Feet supported;No upper extremity supported  Sitting balance-Leahy Scale Good  Standing balance support No upper extremity supported;During functional activity  Standing balance-Leahy Scale Fair  Standing balance comment pt is able to stand, reach 6" out of BOS, wash hands without LOB  Total Joint Exercises  Ankle Circles/Pumps AROM;Both;10 reps  Quad Sets AROM;Right;10 reps  Short Arc Quad AROM;10 reps;Right  Heel Slides AAROM;Right;10 reps  Straight Leg Raises AAROM;Right;10 reps;AROM  Hip ABduction/ADduction AROM;Right;10 reps  Goniometric ROM 8* to 68*  PT - End of Session  Equipment Utilized During Treatment Gait belt;Right knee immobilizer  Activity Tolerance Patient tolerated treatment well  Patient left in bed;with call bell/phone within reach;with bed alarm set;with family/visitor present  Nurse Communication Mobility status;Patient requests pain meds  PT - Assessment/Plan  PT Plan Current plan remains appropriate  PT Visit Diagnosis Other abnormalities of gait and mobility (R26.89)  PT Frequency (ACUTE ONLY) 7X/week  Follow Up Recommendations Home health PT  PT equipment Other (comment) (delivered)  AM-PAC PT "6 Clicks" Daily Activity Outcome Measure  Difficulty turning over in bed (including adjusting bedclothes, sheets and blankets)? 3  Difficulty moving from lying on back to sitting on the side of the bed?  3  Difficulty sitting down on and standing up from a chair  with arms (e.g., wheelchair, bedside commode, etc,.)? 3  Help needed moving to and from a bed to chair (including a wheelchair)? 3  Help needed  walking in hospital room? 3  Help needed climbing 3-5 steps with a railing?  3  6 Click Score 18  Mobility G Code  CK  PT Goal Progression  Progress towards PT goals Progressing toward goals  Acute Rehab PT Goals  PT Goal Formulation With patient  Time For Goal Achievement 12/11/17  Potential to Achieve Goals Good  PT Time Calculation  PT Start Time (ACUTE ONLY) 1235  PT Stop Time (ACUTE ONLY) 1330  PT Time Calculation (min) (ACUTE ONLY) 55 min  PT General Charges  $$ ACUTE PT VISIT 1 Visit  PT Treatments  $Gait Training 8-22 mins  $Therapeutic Exercise 8-22 mins  $Therapeutic Activity 8-22 mins  $Self Care/Home Management 8-22

## 2017-12-10 NOTE — Progress Notes (Signed)
Physical Therapy Treatment Patient Details Name: Brent Mason MRN: [id number] DOB: Feb 15, 1946 Today's Date: 12/10/2017    History of Present Illness Pt is a 72 year old male s/p R TKA    PT Comments    POD # 3, Pt doing well today but frustrated that he cannot eat; called radiology to push pt up on schedule if possible; pt has no stairs at home; family possibly still interested in SNF--PT is not opposed to this but pt is mobilizing at a supervision level for basic functinal tasks, amb 52' today with RW and supervision, no LOB.  Pt was able to amb greater distance yesterday, he feels this is because he was able to eat  Follow Up Recommendations  Home health PT     Equipment Recommendations  Rolling walker with 5" wheels    Recommendations for Other Services       Precautions / Restrictions Precautions Precautions: Knee;Fall Precaution Comments: KI use reviewed with pt and dtr, KI utilized this pm as pt with incr pain and therefore incr quad lag Required Braces or Orthoses: Knee Immobilizer - Right Knee Immobilizer - Right: Discontinue once straight leg raise with < 10 degree lag Restrictions Other Position/Activity Restrictions: WBAT    Mobility  Bed Mobility               General bed mobility comments: pt received in chair  Transfers   Equipment used: Rolling walker (2 wheeled) Transfers: Sit to/from Stand Sit to Stand: Supervision         General transfer comment: VC for LE and UE positioing  Ambulation/Gait Ambulation/Gait assistance: Supervision Ambulation Distance (Feet): 70 Feet Assistive device: Rolling walker (2 wheeled) Gait Pattern/deviations: Step-to pattern     General Gait Details: cues for sequence and posture, steady with use of RW   Stairs            Wheelchair Mobility    Modified Rankin (Stroke Patients Only)       Balance                                            Cognition Arousal/Alertness:  Awake/alert Behavior During Therapy: WFL for tasks assessed/performed Overall Cognitive Status: Within Functional Limits for tasks assessed                                        Exercises      General Comments        Pertinent Vitals/Pain Pain Assessment: 0-10 Pain Score: 8  Pain Location: R knee/ankle Pain Descriptors / Indicators: Sore;Aching Pain Intervention(s): Limited activity within patient's tolerance;Monitored during session;Premedicated before session(declined ice)    Home Living                      Prior Function            PT Goals (current goals can now be found in the care plan section) Acute Rehab PT Goals Patient Stated Goal: decreased pain and get some food! PT Goal Formulation: With patient Time For Goal Achievement: 12/11/17 Potential to Achieve Goals: Good Progress towards PT goals: Progressing toward goals    Frequency    7X/week      PT Plan Current plan remains appropriate    Co-evaluation  AM-PAC PT "6 Clicks" Daily Activity  Outcome Measure  Difficulty turning over in bed (including adjusting bedclothes, sheets and blankets)?: A Lot Difficulty moving from lying on back to sitting on the side of the bed? : Unable Difficulty sitting down on and standing up from a chair with arms (e.g., wheelchair, bedside commode, etc,.)?: A Little Help needed moving to and from a bed to chair (including a wheelchair)?: A Little Help needed walking in hospital room?: A Little Help needed climbing 3-5 steps with a railing? : A Little 6 Click Score: 15    End of Session Equipment Utilized During Treatment: Gait belt Activity Tolerance: Patient tolerated treatment well Patient left: with call bell/phone within reach;in chair;with family/visitor present;with chair alarm set   PT Visit Diagnosis: Other abnormalities of gait and mobility (R26.89)     Time: 3810-1751 PT Time Calculation (min) (ACUTE ONLY):  32 min  Charges:  $Gait Training: 23-37 mins                    G CodesKenyon Ana, PT Pager: [phone] 12/10/2017    Kenyon Ana 12/10/2017, 10:21 AM

## 2017-12-11 ENCOUNTER — Inpatient Hospital Stay (HOSPITAL_COMMUNITY): Payer: Medicare Other

## 2017-12-11 ENCOUNTER — Encounter (HOSPITAL_COMMUNITY)
Admission: RE | Disposition: A | Discharge status: Home Health | Payer: Self-pay | Source: Ambulatory Visit | Attending: Orthopaedic Surgery

## 2017-12-11 ENCOUNTER — Encounter (HOSPITAL_COMMUNITY): Payer: Self-pay | Admitting: *Deleted

## 2017-12-11 ENCOUNTER — Inpatient Hospital Stay (HOSPITAL_COMMUNITY): Payer: Medicare Other | Admitting: Anesthesiology

## 2017-12-11 HISTORY — PX: ESOPHAGOGASTRODUODENOSCOPY (EGD) WITH PROPOFOL: SHX5813

## 2017-12-11 HISTORY — PX: BALLOON DILATION: SHX5330

## 2017-12-11 LAB — BASIC METABOLIC PANEL
ANION GAP: 9 (ref 5–15)
BUN: 13 mg/dL (ref 6–20)
CHLORIDE: 103 mmol/L (ref 101–111)
CO2: 25 mmol/L (ref 22–32)
Calcium: 8.2 mg/dL — ABNORMAL LOW (ref 8.9–10.3)
Creatinine, Ser: 0.82 mg/dL (ref 0.61–1.24)
GFR calc non Af Amer: >60 mL/min (ref >60)
GLUCOSE: 97 mg/dL (ref 65–99)
POTASSIUM: 5 mmol/L (ref 3.5–5.1)
Sodium: 137 mmol/L (ref 135–145)

## 2017-12-11 LAB — CBC
HEMATOCRIT: 34.1 % — AB (ref 39.0–52.0)
HEMOGLOBIN: 11.4 g/dL — AB (ref 13.0–17.0)
MCH: 32 pg (ref 26.0–34.0)
MCHC: 33.4 g/dL (ref 30.0–36.0)
MCV: 95.8 fL (ref 78.0–100.0)
Platelets: 185 10*3/uL (ref 150–400)
RBC: 3.56 MIL/uL — AB (ref 4.22–5.81)
RDW: 12.8 % (ref 11.5–15.5)
WBC: 5.9 10*3/uL (ref 4.0–10.5)

## 2017-12-11 LAB — PROTIME-INR
INR: 1.59
PROTHROMBIN TIME: 18.8 s — AB (ref 11.4–15.2)

## 2017-12-11 SURGERY — ESOPHAGOGASTRODUODENOSCOPY (EGD) WITH PROPOFOL
Anesthesia: Monitor Anesthesia Care

## 2017-12-11 MED ORDER — LIDOCAINE 2% (20 MG/ML) 5 ML SYRINGE
INTRAMUSCULAR | Status: DC | PRN
  Administered 2017-12-11: 60 mg via INTRAVENOUS

## 2017-12-11 MED ORDER — WARFARIN SODIUM 5 MG PO TABS
5.0000 mg | ORAL_TABLET | Freq: Once | ORAL | Status: AC
  Administered 2017-12-11: 5 mg via ORAL
  Filled 2017-12-11: qty 1

## 2017-12-11 MED ORDER — PROPOFOL 10 MG/ML IV BOLUS
INTRAVENOUS | Status: AC
  Filled 2017-12-11: qty 40

## 2017-12-11 MED ORDER — PROPOFOL 500 MG/50ML IV EMUL
INTRAVENOUS | Status: DC | PRN
  Administered 2017-12-11: 100 ug/kg/min via INTRAVENOUS

## 2017-12-11 MED ORDER — IPRATROPIUM-ALBUTEROL 0.5-2.5 (3) MG/3ML IN SOLN: 3.0000 mL | Freq: Three times a day (TID) | RESPIRATORY_TRACT | Status: DC

## 2017-12-11 MED ORDER — FUROSEMIDE 10 MG/ML IJ SOLN
40.0000 mg | Freq: Once | INTRAMUSCULAR | Status: AC
  Administered 2017-12-11: 40 mg via INTRAVENOUS
  Filled 2017-12-11: qty 4

## 2017-12-11 MED ORDER — LACTATED RINGERS IV SOLN
INTRAVENOUS | Status: DC
  Administered 2017-12-11: 1000 mL via INTRAVENOUS

## 2017-12-11 MED ORDER — SODIUM CHLORIDE 0.9 % IV SOLN
INTRAVENOUS | Status: DC
  Administered 2017-12-11: 18:00:00 via INTRAVENOUS

## 2017-12-11 MED ORDER — WARFARIN - PHARMACIST DOSING INPATIENT: Freq: Every day | Status: DC

## 2017-12-11 MED ORDER — PROPOFOL 10 MG/ML IV BOLUS
INTRAVENOUS | Status: DC | PRN
  Administered 2017-12-11 (×2): 20 mg via INTRAVENOUS

## 2017-12-11 MED ORDER — IPRATROPIUM-ALBUTEROL 0.5-2.5 (3) MG/3ML IN SOLN: 3.0000 mL | Freq: Three times a day (TID) | RESPIRATORY_TRACT | Status: DC | PRN

## 2017-12-11 MED ORDER — SODIUM CHLORIDE 0.9 % IV SOLN: INTRAVENOUS | Status: DC

## 2017-12-11 MED ORDER — ALBUTEROL SULFATE (2.5 MG/3ML) 0.083% IN NEBU
2.5000 mg | INHALATION_SOLUTION | Freq: Once | RESPIRATORY_TRACT | Status: AC
  Administered 2017-12-11: 2.5 mg via RESPIRATORY_TRACT
  Filled 2017-12-11: qty 3

## 2017-12-11 SURGICAL SUPPLY — 15 items

## 2017-12-11 NOTE — Transfer of Care (Signed)
Immediate Anesthesia Transfer of Care Note  Patient: Brent Mason  Procedure(s) Performed: ESOPHAGOGASTRODUODENOSCOPY (EGD) WITH PROPOFOL (N/A ) BALLOON DILATION (N/A )  Patient Location: PACU and Endoscopy Unit  Anesthesia Type:MAC  Level of Consciousness: sedated and patient cooperative  Airway & Oxygen Therapy: Patient Spontanous Breathing and Patient connected to nasal cannula oxygen  Post-op Assessment: Report given to RN and Post -op Vital signs reviewed and stable  Post vital signs: Reviewed and stable  Last Vitals:  Vitals:   12/11/17 1016 12/11/17 1241  BP: (!) 149/87 118/61  Pulse: 67 81  Resp:  (!) 21  Temp:  36.5 C  SpO2:  98%    Last Pain:  Vitals:   12/11/17 1241  TempSrc: Oral  PainSc: 5       Patients Stated Pain Goal: 4 (93/57/01 7793)  Complications: No apparent anesthesia complications

## 2017-12-11 NOTE — Progress Notes (Signed)
Patients daughter called nurse to room. When nurse entered room patient is has labored breathing with audible wheezes and is shaking uncontrollably. Nurse listened to lung sounds. Patient had expiratory wheezes throughout lungs. Nurse obtained vital signs. While getting vital signs pulse was registering 34 and oxygen saturation was 80. Nurse placed patient on 2LO2 via nasal cannula. Nurse called charge nurse to call rapid response. When nurse was assessing heart rate nurse noticed patient did not have finger probe on finger correctly. Nurse repositioned finger probe and pulse increased to 120-130s. Several nurses came to room to assist in patients care. Rapid response came into room. Patients oxygen was increased to [facility] per rapid response nurse request. Nurse paged Dr. Ninfa Linden. Dr. Ninfa Linden gave following verbal orders: NS @75 , chest xray 1view, CBC/BMET, give patients oral dose of furosemide (patient had refused furosemide dose this morning because he was going for procedure and going home and did not want to be urinating so much. Patient explained he would take furosemide when he got home). Nurse placed all orders while rapid response was taking care of the patient. Two floor nurses attempted an IV, was unsuccessful. IV team was called to get IV. Nurse helped rapid response nurse take patient to step down unit. Nurse gave report to [employer], ICU nurse. Nurse was unable to give furosemide prior to leaving floor due to need of heading to step down and transport happening quickly. Nurse made ICU nurse aware of furosemide needing to be given.

## 2017-12-11 NOTE — Discharge Summary (Signed)
Patient ID: Brent Mason MRN: [id number] DOB/AGE: 1946-07-04 72 y.o.  Admit date: 12/07/2017 Discharge date: 12/11/2017  Admission Diagnoses:  Principal Problem:   Unilateral primary osteoarthritis, right knee Active Problems:   ESOPHAGEAL STRICTURE   Status post total right knee replacement   Discharge Diagnoses:  Same  Past Medical History:  Diagnosis Date  . Anemia   . Anticoagulated on Coumadin    long-term  . Chronic kidney disease    frequent uti's last uti few yrs ago  . Diastolic CHF, chronic ([place])   . Dysrhythmia   . GERD (gastroesophageal reflux disease)   . Headache   . Hiatal hernia   . History of adenomatous polyp of colon   . History of esophageal stricture    s/p  dilatations  . History of GI bleed    03/ 2003  per EGD gastritis  . HOH (hard of hearing)    both ears  . Hypertension   . Non-rheumatic aortic stenosis    per last echo 12-14-2015    . OA (osteoarthritis)    oa  . Persistent atrial fibrillation [facility]) first dx  1999   primary cardiologist-  dr taylor-- hx DCCV  . Pneumonia 2009   with cpap usage  . Shortness of breath    when exertion  . Sleep apnea    last sleep study > 5 yrs   no cpap used    Surgeries: Procedure(s): RIGHT TOTAL KNEE ARTHROPLASTY on 12/07/2017   Consultants: Treatment Team:  Clarene Essex, MD  Discharged Condition: Improved  Hospital Course: Brent Mason is an 72 y.o. male who was admitted 12/07/2017 for operative treatment ofUnilateral primary osteoarthritis, right knee. Patient has severe unremitting pain that affects sleep, daily activities, and work/hobbies. After pre-op clearance the patient was taken to the operating room on 12/07/2017 and underwent  Procedure(s): RIGHT TOTAL KNEE ARTHROPLASTY.    Patient was given perioperative antibiotics:  Anti-infectives (From admission, onward)   Start     Dose/Rate Route Frequency Ordered Stop   12/07/17 1600  ceFAZolin (ANCEF) IVPB 1 g/50 mL premix     1  g 100 mL/hr over 30 Minutes Intravenous Every 6 hours 12/07/17 1235 12/07/17 2249   12/07/17 0600  ceFAZolin (ANCEF) 3 g in dextrose 5 % 50 mL IVPB     3 g 130 mL/hr over 30 Minutes Intravenous On call to O.R. 12/06/17 1307 12/07/17 0915       Patient was given sequential compression devices, early ambulation, and chemoprophylaxis to prevent DVT.  Patient benefited maximally from hospital stay.  The patient did have a choking episode during his hospitalization and has a history of a previous esophageal stricture.  He was seen in consultation by the GI service and they did perform a barium swallow study and on the day of discharge performed procedure to deal with the esophageal stricture.  From a total knee arthroplasty standpoint he did quite well and was allowed to be discharged to home with home health therapy following this procedure.  Recent vital signs:  Patient Vitals for the past 24 hrs:  BP Temp Temp src Pulse Resp SpO2  12/11/17 0549 123/79 97.7 F (36.5 C) Oral 75 14 100 %  12/10/17 2040 117/66 97.8 F (36.6 C) Oral 72 15 98 %  12/10/17 1629 (!) 110/58 - - 68 - -  12/10/17 1400 (!) 108/52 97.7 F (36.5 C) Oral 77 16 94 %  12/10/17 1125 127/71 - - 76 - 98 %  Recent laboratory studies:  Recent Labs    12/10/17 2979 12/11/17 0602  INR 1.47 1.59     Discharge Medications:   Allergies as of 12/11/2017   No Known Allergies     Medication List    STOP taking these medications   HYDROcodone-acetaminophen 10-325 MG tablet Commonly known as:  NORCO     TAKE these medications   atorvastatin 10 MG tablet Commonly known as:  LIPITOR Take 10 mg by mouth daily.   carvedilol 3.125 MG tablet Commonly known as:  COREG Take 1 tablet (3.125 mg total) by mouth 2 (two) times daily with a meal.   ferrous sulfate 325 (65 FE) MG tablet Take 325 mg by mouth daily with breakfast.   fluticasone 50 MCG/ACT nasal spray Commonly known as:  FLONASE Place 1 spray into the nose  daily.   furosemide 40 MG tablet Commonly known as:  LASIX Take 1 tablet (40 mg total) by mouth daily.   losartan 100 MG tablet Commonly known as:  COZAAR Take 100 mg by mouth daily.   methocarbamol 500 MG tablet Commonly known as:  ROBAXIN Take 1 tablet (500 mg total) by mouth every 6 (six) hours as needed for muscle spasms.   omeprazole 20 MG capsule Commonly known as:  PRILOSEC Take 20 mg by mouth at bedtime.   oxyCODONE 5 MG immediate release tablet Commonly known as:  ROXICODONE Take 1-2 tablets (5-10 mg total) by mouth every 4 (four) hours as needed.   warfarin 5 MG tablet Commonly known as:  COUMADIN Take as directed. If you are unsure how to take this medication, talk to your nurse or doctor. Original instructions:  TAKE ONE TABLET BY MOUTH ONCE DAILY, EXCEPT TUESDAYS AND THURSDAYS TAKE 1/2 TABLET. What changed:  See the new instructions.            Durable Medical Equipment  (From admission, onward)        Start     Ordered   12/07/17 1236  DME 3 n 1  Once     12/07/17 1235   12/07/17 1236  DME Walker rolling  Once    Question:  Patient needs a walker to treat with the following condition  Answer:  Status post total right knee replacement   12/07/17 1235      Diagnostic Studies: Dg Esophagus  Result Date: 12/10/2017 CLINICAL DATA:  Dysphagia. EXAM: ESOPHOGRAM / BARIUM SWALLOW / BARIUM TABLET STUDY TECHNIQUE: Combined double contrast and single contrast examination performed using effervescent crystals, thick barium liquid, and thin barium liquid. The patient was observed with fluoroscopy swallowing a 13 mm barium sulphate tablet. FLUOROSCOPY TIME:  Fluoroscopy Time:  3.0 minutes. Radiation Exposure Index (if provided by the fluoroscopic device): 55.3 mGy. Number of Acquired Spot Images: 1 COMPARISON:  None. FINDINGS: The pharyngeal phase of swallowing was normal. Primary peristaltic waves in the esophagus were normal. Mild tertiary contractions. No  obstruction to the forward flow of contrast throughout the esophagus and into the stomach. Normal esophageal course and contour. Normal esophageal mucosal pattern. There is a short segment, smooth, benign-appearing stricture of the distal esophagus just proximal to gastroesophageal junction. No esophageal ulceration or other significant abnormality. No hiatal hernia. No gastroesophageal reflux occurred spontaneously or was elicited. A 13 mm barium tablet did not pass into the stomach. IMPRESSION: 1. Short segment, smooth, benign-appearing stricture of the distal esophagus just proximal to the gastroesophageal junction. The barium tablet would not pass into the stomach. Electronically Signed  By: Titus Dubin M.D.   On: 12/10/2017 11:09   Dg Chest Port 1 View  Result Date: 12/09/2017 CLINICAL DATA:  Wheezing hypertension EXAM: PORTABLE CHEST 1 VIEW COMPARISON:  09/15/2015 FINDINGS: Postsurgical changes in the cervical spine. Moderate cardiomegaly with vascular congestion. No acute infiltrate or effusion. No pneumothorax. IMPRESSION: Stable cardiomegaly with mild central congestion. No focal pulmonary infiltrate. Electronically Signed   By: Donavan Foil M.D.   On: 12/09/2017 00:11   Dg Knee Right Port  Result Date: 12/07/2017 CLINICAL DATA:  Total knee replacement. EXAM: PORTABLE RIGHT KNEE - 1-2 VIEW COMPARISON:  05/28/2017. FINDINGS: Total right knee replacement. Hardware intact. Anatomic alignment. No acute bony abnormality. Peripheral vascular calcification. IMPRESSION: 1.  Total right knee replacement anatomic alignment. 2.  Peripheral vascular disease. Electronically Signed   By: Marcello Moores  Register   On: 12/07/2017 12:01    Disposition: 01-Home or Self Care  Discharge Instructions    Discharge patient   Complete by:  As directed    Can discharge to home later this afternoon after his GI procedure.   Discharge disposition:  01-Home or Self Care   Discharge patient date:  12/11/2017       Follow-up Information    Mcarthur Rossetti, MD Follow up in 2 week(s).   Specialty:  Orthopedic Surgery Contact information: [place] [address] [phone]        Home, [facility] Follow up.   Specialty:  [place] Why:  Home Health Physical Therapy, aide- agency will call to arrange initial visit Contact information: [place] [address] [phone]            Signed: Mcarthur Rossetti 12/11/2017, 7:28 AM

## 2017-12-11 NOTE — Op Note (Signed)
[facility] Patient Name: Brent Mason Procedure Date: 12/11/2017 MRN: [id number] Attending MD: Clarene Essex , MD Date of Birth: 1945/10/20 CSN: [id number] Age: 72 Admit Type: Inpatient Procedure:                Upper GI endoscopy Indications:              Dysphagia Providers:                Clarene Essex, MD, Carolynn Comment RN, RN, Elspeth Cho Tech., Technician, Dione Booze, CRNA Referring MD:              Medicines:                Propofol total dose 187 mg IV, 100 mg IV lidocaine Complications:            No immediate complications. Estimated Blood Loss:     Estimated blood loss: none. Procedure:                Pre-Anesthesia Assessment:                           - Prior to the procedure, a History and Physical                            was performed, and patient medications and                            allergies were reviewed. The patient's tolerance of                            previous anesthesia was also reviewed. The risks                            and benefits of the procedure and the sedation                            options and risks were discussed with the patient.                            All questions were answered, and informed consent                            was obtained. Prior Anticoagulants: The patient has                            taken Coumadin (warfarin), last dose was 2 days                            prior to procedure. ASA Grade Assessment: II - A                            patient with mild systemic disease. After reviewing  the risks and benefits, the patient was deemed in                            satisfactory condition to undergo the procedure.                           After obtaining informed consent, the endoscope was                            passed under direct vision. Throughout the                            procedure, the patient's blood pressure, pulse, and                             oxygen saturations were monitored continuously. The                            ([id number]) [id number] was introduced through the                            mouth, and advanced to the third part of duodenum.                            The upper GI endoscopy was accomplished without                            difficulty. The patient tolerated the procedure                            well. Scope In: Scope Out: Findings:      The larynx was normal.      LA Grade A (one or more mucosal breaks less than 5 mm, not extending       between tops of 2 mucosal folds) esophagitis with no bleeding was found.      Abnormal motility was noted at the gastroesophageal junction. The distal       esophagus/lower esophageal sphincter is spastic, but gives up passage to       the endoscope. A TTS dilator was passed through the scope. Dilation with       a 15-16.5-18 mm balloon dilator was performed to 16.5 mm. we also used       the 15 mm balloon to start with .The dilation site was examined and       showed no change and no obvious trauma.      The entire examined stomach was normal.      The duodenal bulb, first portion of the duodenum, second portion of the       duodenum and third portion of the duodenum were normal.      The exam was otherwise without abnormality. Impression:               - Normal larynx.                           - LA Grade A reflux esophagitis. Versus  inflammation from recent food impaction                           - Abnormal esophageal motility, suspicious for                            esophageal spasm. Dilated. No obvious stricture or                            ring was seen                           - Normal stomach.                           - Normal duodenal bulb, first portion of the                            duodenum, second portion of the duodenum and third                            portion of the duodenum.                            - The examination was otherwise normal.                           - No specimens collected. Moderate Sedation:      N/A- Per Anesthesia Care Recommendation:           - Patient has a contact number available for                            emergencies. The signs and symptoms of potential                            delayed complications were discussed with the                            patient. Return to normal activities tomorrow.                            Written discharge instructions were provided to the                            patient.                           - Begin with clear liquids and if well tolerated                            advance to Soft diet today.                           - Continue present medications.                           -  Return to GI clinic PRN.                           - Telephone GI clinic if symptomatic PRN.                           - Resume Coumadin (warfarin) at prior dose today. Procedure Code(s):        --- Professional ---                           [phone], Esophagogastroduodenoscopy, flexible,                            transoral; with transendoscopic balloon dilation of                            esophagus (less than 30 mm diameter) Diagnosis Code(s):        --- Professional ---                           K21.0, Gastro-esophageal reflux disease with                            esophagitis                           K22.4, Dyskinesia of esophagus                           R13.10, Dysphagia, unspecified CPT copyright 2016 American Medical Association. All rights reserved. The codes documented in this report are preliminary and upon coder review may  be revised to meet current compliance requirements. Clarene Essex, MD 12/11/2017 2:01:37 PM This report has been signed electronically. Number of Addenda: 0

## 2017-12-11 NOTE — Progress Notes (Signed)
This RN updated rounding MD that patient refused his Lasix this morning and that his Coumadin was discontinued yesterday. MD informed this RN to hold off on giving Lasix until she reviews chest x-ray  and lab results, and then she will put in orders. Update given to the oncoming nurse.

## 2017-12-11 NOTE — Progress Notes (Signed)
Patient ID: Brent Mason, male   DOB: 1946/03/10, 72 y.o.   MRN: [id number] No acute changes.  Mobilizing very well.  Can be discharged to home today (late afternoon) after his GI procedure.

## 2017-12-11 NOTE — Progress Notes (Signed)
Rapid response called due to change in patient condition. Per bedside RN patient slumped over in chair post procedure. Upon arrival patient appeared to be extremely anxious, and dry heaving in chair. Patient dressed and had IV removed. Once placing on RR monitor, patient appeared to be in A Fib RVR. MD paged, and orders received for fluids, labs, X Ray, and lasix. Upon completing 12 lead ekg patient in AFib RVR. Patient brought to SD due to new onset SHOB, confusion, and AFib RVR. [facility] consulted.

## 2017-12-11 NOTE — Progress Notes (Signed)
Patient ID: Brent Mason, male   DOB: 08/26/1946, 72 y.o.   MRN: [id number] The patient is currently in the stepdown unit at [facility].  He was to be discharged this afternoon after having a procedure by the GI physicians due to an esophageal stricture.  As he was preparing to be discharged he got lightheaded and tachycardic as well as had low O2 sats.  Rapid response was called and he was transferred to the stepdown unit.  His family is at the bedside.  He is alert and somewhat confused but only minimal.  His blood pressure is stable and he does have atrial fibrillation which is chronic and he is not currently in rapid ventricular response.  His vitals are stable otherwise.  [facility] have been consulted as well as the cardiologist.  Currently he is denying chest pain or shortness of breath.  From an orthopedic surgery standpoint his Coumadin should be resumed since he was on this preoperative for his chronic atrial fibrillation.  He can continue attempts to mobilize with weightbearing as tolerated on his right knee.  I appreciate the hospitalist's consulting on this patient for further evaluation and treatment of this acute episode.  Hopefully he will stabilize easily and can be discharged in the next day or so.

## 2017-12-11 NOTE — Progress Notes (Signed)
Occupational Therapy Treatment Patient Details Name: Brent Mason MRN: [id number] DOB: [birth year] Today's Date: 12/11/2017    History of present illness Pt is a 72 year old male s/p R TKA   OT comments  Pt seen for additional treatment session with family education included. Pt completing LB dressing with MinA, UB dressing with setup. Educated pt/pt family on safe shower transfer with handout provided and pt/pt family reporting understanding. Feel pt is safe to return home with family and with follow up [facility]. Anticipating d/c later this afternoon.    Follow Up Recommendations  Home health OT;Supervision/Assistance - 24 hour    Equipment Recommendations  None recommended by OT          Precautions / Restrictions Precautions Precautions: Knee Restrictions Weight Bearing Restrictions: No Other Position/Activity Restrictions: WBAT       Mobility Bed Mobility               General bed mobility comments: OOB in recliner   Transfers Overall transfer level: Needs assistance   Transfers: Sit to/from Stand Sit to Stand: Supervision         General transfer comment: Supervision for safety                                                ADL either performed or assessed with clinical judgement   ADL Overall ADL's : Needs assistance/impaired                 Upper Body Dressing : Set up;Sitting   Lower Body Dressing: Minimal assistance;Sit to/from stand;Cueing for sequencing;Cueing for compensatory techniques Lower Body Dressing Details (indicate cue type and reason): assist to thread RLE into pant leg and steadying assist provided while standing at RW as pt advances over hips            Tub/Shower Transfer Details (indicate cue type and reason): educated pt/pt's daughter on safe shower transfer and handout provided, recommend Pt have [facility] practice shower transfer initially for increased safety with pt/family verbalizing  understanding  Functional mobility during ADLs: Minimal assistance;Rolling walker                         Cognition Arousal/Alertness: Awake/alert Behavior During Therapy: WFL for tasks assessed/performed Overall Cognitive Status: Impaired/Different from baseline Area of Impairment: Memory                     Memory: Decreased short-term memory         General Comments: pt with difficulty recalling events of OT tx session earlier today                           Pertinent Vitals/ Pain       Pain Assessment: Faces Faces Pain Scale: Hurts little more Pain Location: right knee Pain Descriptors / Indicators: Sore Pain Intervention(s): Monitored during session                                                          Frequency  Min 2X/week        Progress Toward  Goals  OT Goals(current goals can now be found in the care plan section)  Progress towards OT goals: Progressing toward goals  Acute Rehab OT Goals Patient Stated Goal: less pain, go home OT Goal Formulation: With patient Time For Goal Achievement: 12/22/17 Potential to Achieve Goals: Good  Plan Discharge plan remains appropriate                     AM-PAC PT "6 Clicks" Daily Activity     Outcome Measure   Help from another person eating meals?: None Help from another person taking care of personal grooming?: A Little Help from another person toileting, which includes using toliet, bedpan, or urinal?: A Lot Help from another person bathing (including washing, rinsing, drying)?: A Little Help from another person to put on and taking off regular upper body clothing?: A Little Help from another person to put on and taking off regular lower body clothing?: A Lot 6 Click Score: 17    End of Session Equipment Utilized During Treatment: Rolling walker  OT Visit Diagnosis: Unsteadiness on feet (R26.81);Pain Pain - Right/Left: Right Pain - part of  body: Knee;Ankle and joints of foot   Activity Tolerance Patient tolerated treatment well   Patient Left in chair;with call bell/phone within reach;with family/visitor present   Nurse Communication Mobility status        Time: 1308-6578 OT Time Calculation (min): 22 min  Charges: OT General Charges $OT Visit: 1 Visit OT Treatments $Self Care/Home Management : 8-22 mins  Lou Cal, OT Pager [phone] 12/11/2017    Raymondo Band 12/11/2017, 5:06 PM

## 2017-12-11 NOTE — Anesthesia Procedure Notes (Signed)
Procedure Name: MAC Date/Time: 12/11/2017 1:37 PM Performed by: Dione Booze, CRNA Pre-anesthesia Checklist: Patient identified, Emergency Drugs available, Suction available and Patient being monitored Patient Re-evaluated:Patient Re-evaluated prior to induction Oxygen Delivery Method: Nasal cannula Placement Confirmation: positive ETCO2

## 2017-12-11 NOTE — Progress Notes (Signed)
Occupational Therapy Treatment Patient Details Name: Brent Mason MRN: [id number] DOB: 15-Oct-1946 Today's Date: 12/11/2017    History of present illness Pt is a 72 year old male s/p R TKA   OT comments  Pt feels he will be able to perform ADL activity with AE and family A at home  Follow Up Recommendations  Supervision/Assistance - 24 hour;SNF;Home health OT    Equipment Recommendations  None recommended by OT    Recommendations for Other Services      Precautions / Restrictions Precautions Precautions: Knee Required Braces or Orthoses: Knee Immobilizer - Right Restrictions Weight Bearing Restrictions: No Other Position/Activity Restrictions: WBAT       Mobility Bed Mobility Overal bed mobility: Needs Assistance Bed Mobility: Sit to Supine       Sit to supine: Supervision;Modified independent (Device/Increase time)   General bed mobility comments: incr time, supervision d/t elevated bed ht to simulate home  Transfers Overall transfer level: Needs assistance Equipment used: Rolling walker (2 wheeled) Transfers: Sit to/from [facility] Sit to Stand: Supervision         General transfer comment: VCs to control descent and for hand placement    Balance                                           ADL either performed or assessed with clinical judgement   ADL Overall ADL's : Needs assistance/impaired                 Upper Body Dressing : Set up;Sitting   Lower Body Dressing: Minimal assistance;Sit to/from stand;Adhering to hip precautions;Bed level;Cueing for sequencing;Cueing for compensatory techniques Lower Body Dressing Details (indicate cue type and reason): pt hAS ae AND IS aware of use.   Pt will use at home for LB dressing  Toilet Transfer: Minimal assistance;RW;Comfort height toilet;BSC;Stand-pivot   Toileting- Clothing Manipulation and Hygiene: Minimal assistance;Sit to/from stand;Cueing for safety;Cueing  for sequencing         General ADL Comments: Family will A as needed     Vision       Perception     Praxis      Cognition Arousal/Alertness: Awake/alert Behavior During Therapy: WFL for tasks assessed/performed Overall Cognitive Status: Within Functional Limits for tasks assessed                                 General Comments: frustrated, Pt shared he thought he would be movung better than this        Exercises     Shoulder Instructions       General Comments      Pertinent Vitals/ Pain       Pain Score: 9  Pain Location: right knee Pain Descriptors / Indicators: Sore Pain Intervention(s): Limited activity within patient's tolerance;Repositioned;Monitored during session;Patient requesting pain meds-RN notified;RN gave pain meds during session  Home Living                                          Prior Functioning/Environment              Frequency  Min 2X/week        Progress Toward Goals  OT Goals(current goals  can now be found in the care plan section)  Progress towards OT goals: Progressing toward goals     Plan Discharge plan needs to be updated    Co-evaluation                 AM-PAC PT "6 Clicks" Daily Activity     Outcome Measure   Help from another person eating meals?: None Help from another person taking care of personal grooming?: A Little Help from another person toileting, which includes using toliet, bedpan, or urinal?: A Lot Help from another person bathing (including washing, rinsing, drying)?: A Little Help from another person to put on and taking off regular upper body clothing?: A Little Help from another person to put on and taking off regular lower body clothing?: A Lot 6 Click Score: 17    End of Session Equipment Utilized During Treatment: Rolling walker  OT Visit Diagnosis: Unsteadiness on feet (R26.81);Pain Pain - Right/Left: Right Pain - part of body: Knee;Ankle and  joints of foot   Activity Tolerance Patient tolerated treatment well   Patient Left with call bell/phone within reach;in chair   Nurse Communication Patient requests pain meds        Time: 1030-1051 OT Time Calculation (min): 21 min  Charges: OT General Charges $OT Visit: 1 Visit OT Treatments $Self Care/Home Management : 8-22 mins  [place], [place]   Betsy Pries 12/11/2017, 11:49 AM

## 2017-12-11 NOTE — Progress Notes (Signed)
Patient offered something to drink in recovery and patient shook his head "no." Having trouble with the pulse oximetry in recovery but is working better now that patient is warmer.

## 2017-12-11 NOTE — Progress Notes (Signed)
Physical Therapy Treatment Patient Details Name: Brent Mason MRN: [id number] DOB: Feb 08, 1946 Today's Date: 12/11/2017    History of Present Illness Pt is a 72 year old male s/p R TKA    PT Comments    POD # 4 Pt OOB in recliner.  Assisted with static standing to void the amb a limited distance in hallway.  Unable to perform TE's with 10/10 knee pain and not yet time for more meds.  Applied ICE.  Pt plans to D/C to home later today.  His daughter is picking him up and he plans to enter his home using a ramp that was installed 15 years ago for his wife.    Follow Up Recommendations  Home health PT     Equipment Recommendations       Recommendations for Other Services       Precautions / Restrictions Precautions Precautions: Knee Required Braces or Orthoses: Knee Immobilizer - Right Restrictions Weight Bearing Restrictions: No Other Position/Activity Restrictions: WBAT    Mobility  Bed Mobility               General bed mobility comments: OOB in recliner  Transfers Overall transfer level: Needs assistance Equipment used: Rolling walker (2 wheeled) Transfers: Sit to/from Stand Sit to Stand: Supervision;Min guard         General transfer comment: VCs to control descent and for hand placement with increased time  Ambulation/Gait Ambulation/Gait assistance: Supervision Ambulation Distance (Feet): 43 Feet Assistive device: Rolling walker (2 wheeled) Gait Pattern/deviations: Step-to pattern;Step-through pattern Gait velocity: decreased   General Gait Details: decreased amb distance due to increased c/o pain and fatigue.  Utilized KI fior increased support   Financial trader [employer] (Stroke Patients Only)       Balance                                            Cognition Arousal/Alertness: Awake/alert Behavior During Therapy: WFL for tasks assessed/performed Overall Cognitive Status:  Within Functional Limits for tasks assessed                                 General Comments: frustrated, Pt shared he thought he would be movung better than this      Exercises      General Comments        Pertinent Vitals/Pain Pain Score: 10-Worst pain ever Pain Location: right knee Pain Descriptors / Indicators: Sore;Aching;Operative site guarding Pain Intervention(s): Monitored during session;Repositioned;Ice applied;Premedicated before session;Patient requesting pain meds-RN notified    Home Living                      Prior Function            PT Goals (current goals can now be found in the care plan section) Progress towards PT goals: Progressing toward goals    Frequency    7X/week      PT Plan Current plan remains appropriate    Co-evaluation              AM-PAC PT "6 Clicks" Daily Activity  Outcome Measure  Difficulty turning over in bed (including adjusting bedclothes, sheets and blankets)?: A Little Difficulty moving from lying  on back to sitting on the side of the bed? : A Little Difficulty sitting down on and standing up from a chair with arms (e.g., wheelchair, bedside commode, etc,.)?: A Little Help needed moving to and from a bed to chair (including a wheelchair)?: A Little Help needed walking in hospital room?: A Little Help needed climbing 3-5 steps with a railing? : A Little 6 Click Score: 18    End of Session Equipment Utilized During Treatment: Gait belt;Right knee immobilizer Activity Tolerance: Patient limited by fatigue;Patient limited by pain Patient left: in chair;with call bell/phone within reach Nurse Communication: Mobility status;Patient requests pain meds PT Visit Diagnosis: Other abnormalities of gait and mobility (R26.89)     Time: 0109-3235 PT Time Calculation (min) (ACUTE ONLY): 25 min  Charges:  $Gait Training: 8-22 mins $Therapeutic Activity: 8-22 mins                    G Codes:        Rica Koyanagi  PTA [facility]  Acute  Rehab Pager      [phone]

## 2017-12-11 NOTE — Progress Notes (Signed)
[name] 12:55 PM  Subjective: Patient without complaints looking forward to going home  Objective: Vital signs stable afebrile exam please see preassessment evaluation INR okay  Assessment: Dysphasia abnormal barium swallow  Plan: Okay to proceed with EGD and dilation with anesthesia assistance and barring a problem okay to go home afterwards  [facility]  Pager [phone] After 5PM or if no answer call [phone]

## 2017-12-11 NOTE — Progress Notes (Signed)
ANTICOAGULATION CONSULT NOTE  Pharmacy Consult for warfarin Indication: hx atrial fibrillation  No Known Allergies  Patient Measurements: Height: 6\' 2"  (188 cm) Weight: 277 lb (125.6 kg) IBW/kg (Calculated) : 82.2 Heparin Dosing Weight:   Vital Signs: Temp: 97.9 F (36.6 C) (02/26 1947) Temp Source: Oral (02/26 1947) BP: 95/67 (02/26 2000) Pulse Rate: 94 (02/26 2000)  Labs: Recent Labs    12/09/17 0553 12/10/17 2010 12/11/17 0602 12/11/17 1750  HGB  --   --   --  11.4*  HCT  --   --   --  34.1*  PLT  --   --   --  185  LABPROT 16.5* 17.7* 18.8*  --   INR 1.34 1.47 1.59  --   CREATININE  --   --   --  0.82    Estimated Creatinine Clearance: 114.7 mL/min (by C-G formula based on SCr of 0.82 mg/dL).   Medications:  PTA warfarin regimen: 2.5 mg daily except 5 mg on MWF  Assessment: Patient is a 72 y.o M with hx of episodic dysphagia and afib on warfarin PTA presented to [facility] on 12/07/17 for right TKA.  Warfarin was started post procedure on 12/07/17.  She had dysphagia with n/v on 2/24 and underwent endoscopy on 2/26 -- GI recommends to resume warfarin post procedure.  - did not get warfarin dose on 2/25 - INR 1.59 today  Goal of Therapy:  INR 2-3 Monitor platelets by anticoagulation protocol: Yes   Plan:  - warfarin 5 mg PO x1 today - daily INR - monitor for s/s bleeding  Aleece Loyd P 12/11/2017,8:39 PM

## 2017-12-11 NOTE — Care Management Important Message (Signed)
Important Message  Patient Details  Name: JAYANTH SZCZESNIAK MRN: [id number] Date of Birth: 1946-03-28   Medicare Important Message Given:  Yes    Kerin Salen 12/11/2017, 10:42 AMImportant Message  Patient Details  Name: YOSHIHARU BRASSELL MRN: [id number] Date of Birth: 1946-03-05   Medicare Important Message Given:  Yes    Kerin Salen 12/11/2017, 10:42 AM

## 2017-12-11 NOTE — Consult Note (Addendum)
Medical Consultation   Brent Mason  JJH:[id number]  DOB: 11-Feb-1946  DOA: 12/07/2017  PCP: Redmond School, MD   Requesting physician: Dr. Jean Rosenthal  Reason for consultation: Diaphoresis, Hypoxia.   History of Present Illness: Brent Mason is an 72 y.o. male for HTN, esophageal strictures, atria fib on chronic anticoagulation who was admitted on 12/07/17, for right total knee arthroplasty, which was done that day, and patient had done well from his arthroplasty standpoint, but had a choking episode on 24th, hospitalist was consulted, GI was consulted, patient had a barium swallow which was abnormal, he had EGD today 2/26-year- revealed abnormal oesophageal motility likely from esophageal spasm, had dilatation. No strictures.  Patient was to be discharged today, but at about 5 PM, patient was suddenly diaphoretic, O2 sats 70s-80s, blood pressure 150/100, HR- 131.  Daughter present at bedside reports patient was confused, and the time of my examination about 1.5 hrs later this appeared to be improved. Patient denies chest pain or shortness of breath, has expected right knee pain. Pt on IVF, per pts nurse this was d/c this a.m. Patient was Restarted on IV fluids- NS, chest x-ray- port, BMP, CBC ordered by ortho.  Hospitalist was called to see in consult.  Review of Systems:  ROS  Past Medical History: Past Medical History:  Diagnosis Date  . Anemia   . Anticoagulated on Coumadin    long-term  . Chronic kidney disease    frequent uti's last uti few yrs ago  . Diastolic CHF, chronic ([place])   . Dysrhythmia   . GERD (gastroesophageal reflux disease)   . Headache   . Hiatal hernia   . History of adenomatous polyp of colon   . History of esophageal stricture    s/p  dilatations  . History of GI bleed    03/ 2003  per EGD gastritis  . HOH (hard of hearing)    both ears  . Hypertension   . Non-rheumatic aortic stenosis    per last echo 12-14-2015      . OA (osteoarthritis)    oa  . Persistent atrial fibrillation [facility]) first dx  1999   primary cardiologist-  dr taylor-- hx DCCV  . Pneumonia 2009   with cpap usage  . Shortness of breath    when exertion  . Sleep apnea    last sleep study > 5 yrs   no cpap used    Past Surgical History: Past Surgical History:  Procedure Laterality Date  . ABDOMINAL HERNIA REPAIR  1990s  . ANTERIOR CERVICAL DECOMP/DISCECTOMY FUSION N/A 02/14/2013   Procedure: ANTERIOR CERVICAL DECOMPRESSION/DISCECTOMY FUSION 2 LEVELS;  Surgeon: Charlie Pitter, MD;  Location: [facility];  Service: Neurosurgery;  Laterality: N/A;  . COLONOSCOPY  last one 06-03-2012  . COLONOSCOPY  06/03/2012   Procedure: COLONOSCOPY;  Surgeon: Daneil Dolin, MD;  Location: [facility];  Service: Endoscopy;  Laterality: N/A;  8:30  . esophageal strecching  yrs ago  . ESOPHAGUS SURGERY  20 yrs ago   for food sticking  . FOOT SURGERY Right 04-27-2010   dr duda   joint arthrodesis  . right foot surgery  2018   to remove screw  . TOTAL KNEE ARTHROPLASTY Right 12/07/2017   Procedure: RIGHT TOTAL KNEE ARTHROPLASTY;  Surgeon: Mcarthur Rossetti, MD;  Location: [facility] ORS;  Service: Orthopedics;  Laterality: Right;  . TRANSTHORACIC ECHOCARDIOGRAM  12-14-2015  dr taylor   moderate LVH, ef 55-60%, indeterminate diastolic function in setting of AFib/  moderate LAE and RAE/  mild to moderate calcific aoric stenosis with mild AR (valve area 1.33cm^2, peak grandient5mmHg, mean grandient 27mmHg)/ trivial MR/ mild TR/ PASP 79mmHg  . VOLVULUS REDUCTION  7989,2119   Allergies:  No Known Allergies  Social History:  reports that  has never smoked. he has never used smokeless tobacco. He reports that he does not drink alcohol or use drugs.  Family History: Family History  Problem Relation Age of Onset  . Heart disease Mother        Pacemaker  . Diabetes Father   . Colon cancer Neg Hx    Physical Exam: Vitals:   12/11/17 1430 12/11/17 1719  12/11/17 1800 12/11/17 1900  BP: 120/62  (!) 121/46 130/64  Pulse: 87 (!) 116 (!) 116 100  Resp: 20 (!) 22 (!) 30 (!) 26  Temp:   98.9 F (37.2 C)   TempSrc:   Oral   SpO2: 100% 99% 96% 100%  Weight:      Height:        Constitutional:  Alert and awake, oriented x3, not in any acute distress. Eyes: PERLA, EOMI, irises appear normal, anicteric sclera,  ENMT: external ears and nose appear normal, hard of hearing            Lips appears normal, oropharynx mucosa, tongue, posterior pharynx appear normal  Neck: neck appears normal, no masses, normal ROM, no thyromegaly, no JVD  CVS: S1-S2 clear, no murmur rubs or gallops, no LE edema, normal pedal pulses  Respiratory: No crackles,  no wheezing, rales or rhonchi. Respiratory effort normal. No accessory muscle use.  Abdomen: soft nontender, nondistended, normal bowel sounds, no hepatosplenomegaly, no hernias  Musculoskeletal: : no cyanosis, clubbing or edema noted bilaterally Neuro: Cranial nerves II-XII intact, strength 5/5 all extremities, except for RLE_surgery, sensation intact,  Psych: judgement and insight appear normal, stable mood and affect, mental status-mildly confused asking to charge his phone Skin: no rashes or lesions or ulcers, no induration or nodules   Data reviewed:  I have personally reviewed following labs and imaging studies Labs:  CBC: Recent Labs  Lab 12/08/17 0609 12/11/17 1750  WBC 16.5* 5.9  HGB 12.6* 11.4*  HCT 38.3* 34.1*  MCV 96.5 95.8  PLT 186 417    Basic Metabolic Panel: Recent Labs  Lab 12/08/17 0609 12/11/17 1750  NA 137 137  K 4.1 5.0  CL 102 103  CO2 22 25  GLUCOSE 186* 97  BUN 14 13  CREATININE 0.95 0.82  CALCIUM 8.3* 8.2*   Coagulation profile Recent Labs  Lab 12/07/17 0759 12/08/17 0609 12/09/17 0553 12/10/17 0613 12/11/17 0602  INR 1.16 1.07 1.34 1.47 1.59    Microbiology No results found for this or any previous visit (from the past 240 hour(s)).     Inpatient  Medications:   Scheduled Meds: . atorvastatin  10 mg Oral q1800  . carvedilol  3.125 mg Oral BID WC  . docusate sodium  100 mg Oral BID  . ferrous sulfate  325 mg Oral Q breakfast  . fluticasone  1 spray Each Nare Daily  . furosemide  40 mg Oral Daily  . losartan  100 mg Oral Daily  . pantoprazole  80 mg Oral Daily   Continuous Infusions: . sodium chloride Stopped (12/11/17 1017)  . sodium chloride 75 mL/hr at 12/11/17 1820  . methocarbamol (ROBAXIN)  IV Stopped (12/07/17 1127)  Radiological Exams on Admission: Dg Esophagus  Result Date: 12/10/2017 CLINICAL DATA:  Dysphagia. EXAM: ESOPHOGRAM / BARIUM SWALLOW / BARIUM TABLET STUDY TECHNIQUE: Combined double contrast and single contrast examination performed using effervescent crystals, thick barium liquid, and thin barium liquid. The patient was observed with fluoroscopy swallowing a 13 mm barium sulphate tablet. FLUOROSCOPY TIME:  Fluoroscopy Time:  3.0 minutes. Radiation Exposure Index (if provided by the fluoroscopic device): 55.3 mGy. Number of Acquired Spot Images: 1 COMPARISON:  None. FINDINGS: The pharyngeal phase of swallowing was normal. Primary peristaltic waves in the esophagus were normal. Mild tertiary contractions. No obstruction to the forward flow of contrast throughout the esophagus and into the stomach. Normal esophageal course and contour. Normal esophageal mucosal pattern. There is a short segment, smooth, benign-appearing stricture of the distal esophagus just proximal to gastroesophageal junction. No esophageal ulceration or other significant abnormality. No hiatal hernia. No gastroesophageal reflux occurred spontaneously or was elicited. A 13 mm barium tablet did not pass into the stomach. IMPRESSION: 1. Short segment, smooth, benign-appearing stricture of the distal esophagus just proximal to the gastroesophageal junction. The barium tablet would not pass into the stomach. Electronically Signed   By: Titus Dubin  M.D.   On: 12/10/2017 11:09    Impression/Recommendations Principal Problem:   Unilateral primary osteoarthritis, right knee Active Problems:   ESOPHAGEAL STRICTURE   Status post total right knee replacement  Diaphoresis, Hypoxia- resolved. Per pts nurse-O2 sats- high 70s- 80s, improved on 2L , BP was 150V systolic. Tachycardia to 130s resolving. No chest pain, no SOB. Chest xray showed vasc congestion- Pt on IVF 75cc/hr ordered 2/22, d/c this a.m. Also home lasix 40mg  given daily except today. Received 10mg  hydrocodone 2 hrs prior, likely contributing. BMP CBC unremarkable. - D/c IVF - IV lasix 40 X1 - Trop  X3 - EKG- Incomplete RBBB, Unchanged from prior. - Reduce hydrocodone to 5mg , D/c oxycodone 10.  Right Lower extremity swelling-likely related to immobility s/p TKA -Right lower extremity Dopplers.  A. fib with RVR resolved. HR <110. INR- 1.6.  -Resume warfarin per pharmacy dosing, following ortho and GI recs -Continue prior-Coreg  3.125 BID  Oesophageal dilatation- EGD, with dilatation today.  Concern for choking episode and possible aspiration 12/09/17.  Chest x-ray does not show infiltrate. WBC- normal 5.9. -Follow-up outpatient  Right Total knee arthroplasty- -per ortho  Thank you for this consultation.  Our [facility] hospitalist team will follow the patient with you.   Bethena Roys M.D. [facility] 12/11/2017, 7:24 PM

## 2017-12-11 NOTE — Anesthesia Preprocedure Evaluation (Addendum)
Anesthesia Evaluation  Patient identified by MRN, date of birth, ID band Patient awake    Reviewed: Allergy & Precautions, H&P , NPO status , Patient's Chart, lab work & pertinent test results  Airway Mallampati: II   Neck ROM: full    Dental   Pulmonary shortness of breath, sleep apnea ,    breath sounds clear to auscultation       Cardiovascular hypertension, +CHF  + dysrhythmias Atrial Fibrillation + Valvular Problems/Murmurs AS  Rhythm:irregular Rate:Normal  Mild to moderate AS on recent TTE.   Neuro/Psych  Headaches,  Neuromuscular disease    GI/Hepatic hiatal hernia, GERD  ,  Endo/Other    Renal/GU Renal disease     Musculoskeletal  (+) Arthritis ,   Abdominal   Peds  Hematology   Anesthesia Other Findings   Reproductive/Obstetrics                            Anesthesia Physical Anesthesia Plan  ASA: III  Anesthesia Plan: MAC   Post-op Pain Management:    Induction: Intravenous  PONV Risk Score and Plan: 1 and Treatment may vary due to age or medical condition and Propofol infusion  Airway Management Planned: Nasal Cannula  Additional Equipment:   Intra-op Plan:   Post-operative Plan:   Informed Consent: I have reviewed the patients History and Physical, chart, labs and discussed the procedure including the risks, benefits and alternatives for the proposed anesthesia with the patient or authorized representative who has indicated his/her understanding and acceptance.     Plan Discussed with: CRNA, Anesthesiologist and Surgeon  Anesthesia Plan Comments:         Anesthesia Quick Evaluation

## 2017-12-12 ENCOUNTER — Inpatient Hospital Stay (HOSPITAL_COMMUNITY): Payer: Medicare Other

## 2017-12-12 ENCOUNTER — Encounter (HOSPITAL_COMMUNITY): Payer: Self-pay | Admitting: Gastroenterology

## 2017-12-12 DIAGNOSIS — I482 Chronic atrial fibrillation: Secondary | ICD-10-CM

## 2017-12-12 DIAGNOSIS — I361 Nonrheumatic tricuspid (valve) insufficiency: Secondary | ICD-10-CM

## 2017-12-12 DIAGNOSIS — M7989 Other specified soft tissue disorders: Secondary | ICD-10-CM

## 2017-12-12 DIAGNOSIS — I5031 Acute diastolic (congestive) heart failure: Secondary | ICD-10-CM

## 2017-12-12 DIAGNOSIS — I350 Nonrheumatic aortic (valve) stenosis: Secondary | ICD-10-CM

## 2017-12-12 LAB — ECHOCARDIOGRAM COMPLETE
HEIGHTINCHES: 74 in
Weight: 4432 oz

## 2017-12-12 LAB — TROPONIN I
TROPONIN I: 0.84 ng/mL — AB (ref <0.03)
TROPONIN I: 0.54 ng/mL — AB (ref <0.03)
Troponin I: 0.81 ng/mL (ref <0.03)

## 2017-12-12 LAB — BRAIN NATRIURETIC PEPTIDE: B NATRIURETIC PEPTIDE 5: 257.4 pg/mL — AB (ref 0.0–100.0)

## 2017-12-12 LAB — PROTIME-INR
INR: 1.66
PROTHROMBIN TIME: 19.4 s — AB (ref 11.4–15.2)

## 2017-12-12 MED ORDER — HYDROCODONE-ACETAMINOPHEN 5-325 MG PO TABS
1.0000 | ORAL_TABLET | ORAL | Status: DC | PRN
  Administered 2017-12-12 – 2017-12-13 (×6): 1 via ORAL
  Filled 2017-12-12 (×6): qty 1

## 2017-12-12 MED ORDER — FUROSEMIDE 10 MG/ML IJ SOLN
20.0000 mg | Freq: Once | INTRAMUSCULAR | Status: AC
  Administered 2017-12-12: 20 mg via INTRAVENOUS
  Filled 2017-12-12: qty 2

## 2017-12-12 MED ORDER — FUROSEMIDE 40 MG PO TABS
40.0000 mg | ORAL_TABLET | Freq: Every day | ORAL | Status: DC
  Administered 2017-12-13: 40 mg via ORAL
  Filled 2017-12-12: qty 1

## 2017-12-12 MED ORDER — WARFARIN SODIUM 5 MG PO TABS
5.0000 mg | ORAL_TABLET | Freq: Once | ORAL | Status: AC
  Administered 2017-12-12: 5 mg via ORAL
  Filled 2017-12-12: qty 1

## 2017-12-12 MED ORDER — ALBUTEROL SULFATE HFA 108 (90 BASE) MCG/ACT IN AERS
INHALATION_SPRAY | RESPIRATORY_TRACT | Status: AC
  Filled 2017-12-12: qty 6.7

## 2017-12-12 NOTE — Anesthesia Postprocedure Evaluation (Signed)
Anesthesia Post Note  Patient: Brent Mason  Procedure(s) Performed: ESOPHAGOGASTRODUODENOSCOPY (EGD) WITH PROPOFOL (N/A ) BALLOON DILATION (N/A )     Patient location during evaluation: PACU Anesthesia Type: MAC Level of consciousness: awake and alert Pain management: pain level controlled Vital Signs Assessment: post-procedure vital signs reviewed and stable Respiratory status: spontaneous breathing, nonlabored ventilation, respiratory function stable and patient connected to nasal cannula oxygen Cardiovascular status: stable and blood pressure returned to baseline Postop Assessment: no apparent nausea or vomiting Anesthetic complications: no    Last Vitals:  Vitals:   12/12/17 1600 12/12/17 1602  BP:  (!) 146/64  Pulse:    Resp:  (!) 21  Temp: 36.8 C   SpO2:      Last Pain:  Vitals:   12/12/17 1740  TempSrc:   PainSc: 10-Worst pain ever                 Leston Schueller S

## 2017-12-12 NOTE — Consult Note (Addendum)
The patient has been seen in conjunction with Charlotta Newton, NP. All aspects of care have been considered and discussed. The patient has been personally interviewed, examined, and all clinical data has been reviewed.   Clinically improved compared to descriptions from status yesterday.  Oxygenation is improved.  Lasix was ordered yesterday but patient refused.  Currently denies dyspnea and chest pain.  Morbidly obese.  Unable to evaluate neck veins.  Right leg is swollen.  Status post right knee replacement.  Chest is clear anteriorly.  Cardiac exam reveals irregularly irregular rhythm.  Chest x-ray reveals mild congestion.  Suspect volume overload in setting of diastolic left ventricular dysfunction.  The mildly elevated troponin likely represents demand ischemia and not a true ACS.  No ischemic evaluation is felt necessary unless significant increase in troponin trending.  Resume diuretic therapy.  Agree with lower extremity Doppler study to exclude DVT.  If present will need IV heparin until INR is therapeutic or bridging Lovenox.  We will check a BNP today.  Trend troponin.  Cardiology Consultation:   Patient ID: Brent Mason; [id number]; [birth year]   Admit date: 12/07/2017 Date of Consult: 12/12/2017  Primary Care Provider: Redmond School, MD Primary Cardiologist: Cristopher Peru, MD -Linna Hoff  Patient Profile:   Brent Mason is a 72 y.o. male with a hx of permanent atrial fibrillation on warfarin, chronic diastolic heart failure, Mild AS, HTN, OSA on CPAP, and obesity who is being seen today for the evaluation of elevated troponin at the request of Dr. Ninfa Linden.  History of Present Illness:   Brent Mason underwent right total knee arthroscopy on 12/07/17 and was doing well postoperatively. By record review, On 2/24 the patient had an episode of choking. GI was consulted and a barium swallow was abnormal. An EGD was done on 12/11/17 that showed abnormal esophageal motility  likely from esophageal spasm and a dilatation was done. There were no strictures.   The patient was going to be discharged yesterday, however, at about 5 pm he suddenly became diaphoretic and hypoxic. His BP was elevated at 150/100 with heart rate of 131 bpm. The daughter was present at the bedside and reported that the patient was confused. He recovered from this incident and denies chest pain or shortness of breath. He was given IV fluids which were then discontinued.   The hospitalist service was consulted. She noted that the patient has received 10 mg hydrocodone 2 hrs prior to event and may have contributed. EKG with no new changes. CXR 2/26 revealed Cardiomegaly with vascular congestion and bibasilar atelectasis. Labs were unremarkable except for a troponin of 0.84. He received one dose of lasix 40 mg IV. He had refused his daily lasix for that day and the day before. An echocardiogram has been ordered.   Blood pressures overnight have been mostly well controlled with a low of 84/50 at 5 am, up to 110/46 at 6 am. Heart rates recorded in the 60's-100.  Feels better this morning.   Brent Mason was last seen in our office on 11/20/17 by Bernerd Pho, [place] for pre-operative evaluation. At that time he had no new symptoms. He was noted to have been skipping some lasix doses due to frequent urination, but had been compliant in run up to his appointment with an associated 8 pound wt loss.   Past Medical History:  Diagnosis Date  . Anemia   . Anticoagulated on Coumadin    long-term  . Chronic kidney disease    frequent uti's  last uti few yrs ago  . Diastolic CHF, chronic ([place])   . Dysrhythmia   . GERD (gastroesophageal reflux disease)   . Headache   . Hiatal hernia   . History of adenomatous polyp of colon   . History of esophageal stricture    s/p  dilatations  . History of GI bleed    03/ 2003  per EGD gastritis  . HOH (hard of hearing)    both ears  . Hypertension   . Non-rheumatic  aortic stenosis    per last echo 12-14-2015    . OA (osteoarthritis)    oa  . Persistent atrial fibrillation [facility]) first dx  1999   primary cardiologist-  dr taylor-- hx DCCV  . Pneumonia 2009   with cpap usage  . Shortness of breath    when exertion  . Sleep apnea    last sleep study > 5 yrs   no cpap used    Past Surgical History:  Procedure Laterality Date  . ABDOMINAL HERNIA REPAIR  1990s  . ANTERIOR CERVICAL DECOMP/DISCECTOMY FUSION N/A 02/14/2013   Procedure: ANTERIOR CERVICAL DECOMPRESSION/DISCECTOMY FUSION 2 LEVELS;  Surgeon: Charlie Pitter, MD;  Location: [place] NEURO ORS;  Service: Neurosurgery;  Laterality: N/A;  . COLONOSCOPY  last one 06-03-2012  . COLONOSCOPY  06/03/2012   Procedure: COLONOSCOPY;  Surgeon: Daneil Dolin, MD;  Location: [facility] ENDO SUITE;  Service: Endoscopy;  Laterality: N/A;  8:30  . esophageal strecching  yrs ago  . ESOPHAGUS SURGERY  20 yrs ago   for food sticking  . FOOT SURGERY Right 04-27-2010   dr duda   joint arthrodesis  . right foot surgery  2018   to remove screw  . TOTAL KNEE ARTHROPLASTY Right 12/07/2017   Procedure: RIGHT TOTAL KNEE ARTHROPLASTY;  Surgeon: Mcarthur Rossetti, MD;  Location: [facility] ORS;  Service: Orthopedics;  Laterality: Right;  . TRANSTHORACIC ECHOCARDIOGRAM  12-14-2015  dr taylor   moderate LVH, ef 55-60%, indeterminate diastolic function in setting of AFib/  moderate LAE and RAE/  mild to moderate calcific aoric stenosis with mild AR (valve area 1.33cm^2, peak grandient69mHg, mean grandient 166mg)/ trivial Brent/ mild TR/ PASP 3385m  . VOLVULUS REDUCTION  [id number]  Home Medications:  Prior to Admission medications   Medication Sig Start Date End Date Taking? Authorizing Provider  atorvastatin (LIPITOR) 10 MG tablet Take 10 mg by mouth daily.  01/22/15  Yes [provider]  carvedilol (COREG) 3.125 MG tablet Take 1 tablet (3.125 mg total) by mouth 2 (two) times daily with a meal. 11/20/17  Yes Strader, BriTanzania  PA-C  ferrous sulfate 325 (65 FE) MG tablet Take 325 mg by mouth daily with breakfast.     Yes [provider]  fluticasone (FLONASE) 50 MCG/ACT nasal spray Place 1 spray into the nose daily.  05/21/13  Yes [provider]  furosemide (LASIX) 40 MG tablet Take 1 tablet (40 mg total) by mouth daily. 11/20/17  Yes Strader, BriFransisco HertzA-C  HYDROcodone-acetaminophen (NORCO) 10-325 MG tablet Take 1 tablet by mouth 2 (two) times daily as needed for moderate pain.   Yes [provider]  losartan (COZAAR) 100 MG tablet Take 100 mg by mouth daily.   Yes [provider]  omeprazole (PRILOSEC) 20 MG capsule Take 20 mg by mouth at bedtime.    Yes [provider]  warfarin (COUMADIN) 5 MG tablet TAKE ONE TABLET BY MOUTH ONCE DAILY, EXCEPT TUESDAYS AND THURSDAYS TAKE  1/2 TABLET. Patient taking differently: Take 2.84m by mouth once daily at night on Sun, Tue, Thurs, and Sat, take 5 mg by mouth once daily at night on Mon, Wed, and Fri 10/15/17  Yes TEvans Lance MD  methocarbamol (ROBAXIN) 500 MG tablet Take 1 tablet (500 mg total) by mouth every 6 (six) hours as needed for muscle spasms. 12/10/17   BMcarthur Rossetti MD  oxyCODONE (ROXICODONE) 5 MG immediate release tablet Take 1-2 tablets (5-10 mg total) by mouth every 4 (four) hours as needed. 12/10/17   BMcarthur Rossetti MD    Inpatient Medications: Scheduled Meds: . atorvastatin  10 mg Oral q1800  . carvedilol  3.125 mg Oral BID WC  . docusate sodium  100 mg Oral BID  . ferrous sulfate  325 mg Oral Q breakfast  . fluticasone  1 spray Each Nare Daily  . [START ON 12/13/2017] furosemide  40 mg Oral Daily  . losartan  100 mg Oral Daily  . pantoprazole  80 mg Oral Daily  . warfarin  5 mg Oral ONCE-1800  . Warfarin - Pharmacist Dosing Inpatient   Does not apply q1800   Continuous Infusions: . methocarbamol (ROBAXIN)  IV Stopped (12/07/17 1127)   PRN Meds: acetaminophen **OR** acetaminophen, alum &  mag hydroxide-simeth, diphenhydrAMINE, HYDROcodone-acetaminophen, ipratropium-albuterol, menthol-cetylpyridinium **OR** phenol, methocarbamol **OR** methocarbamol (ROBAXIN)  IV, metoCLOPramide **OR** metoCLOPramide (REGLAN) injection, ondansetron **OR** ondansetron (ZOFRAN) IV, polyethylene glycol  Allergies:   No Known Allergies  Social History:   Social History   Socioeconomic History  . Marital status: Married    Spouse name: Not on file  . Number of children: Not on file  . Years of education: Not on file  . Highest education level: Not on file  Social Needs  . Financial resource strain: Not on file  . Food insecurity - worry: Not on file  . Food insecurity - inability: Not on file  . Transportation needs - medical: Not on file  . Transportation needs - non-medical: Not on file  Occupational History  . Occupation: LABORER    [employer]    Comment: full time  Tobacco Use  . Smoking status: Never Smoker  . Smokeless tobacco: Never Used  Substance and Sexual Activity  . Alcohol use: No    Alcohol/week: 0.0 oz  . Drug use: No  . Sexual activity: Not on file  Other Topics Concern  . Not on file  Social History Narrative   No regular exercise    Family History:    Family History  Problem Relation Age of Onset  . Heart disease Mother        Pacemaker  . Diabetes Father   . Colon cancer Neg Hx      ROS:  Please see the history of present illness.  Does not take diuretic as recommended. All other ROS reviewed and negative.     Physical Exam/Data:   Vitals:   12/12/17 0327 12/12/17 0500 12/12/17 0600 12/12/17 0800  BP:  (!) 84/50 (!) 110/46   Pulse:  66 (!) 52   Resp:  18 14   Temp: 97.6 F (36.4 C)   99 F (37.2 C)  TempSrc: Oral   Axillary  SpO2:  97% 100%   Weight:      Height:        Intake/Output Summary (Last 24 hours) at 12/12/2017 1210 Last data filed at 12/12/2017 1200 Gross per 24 hour  Intake 1560 ml  Output 955 ml  Net 605 ml    Filed Weights   12/07/17 0705 12/11/17 1241  Weight: 277 lb (125.6 kg) 277 lb (125.6 kg)   Body mass index is 35.56 kg/m.  General:  Well nourished, well developed, in no acute distress HEENT: normal Lymph: no adenopathy Neck: moderate JVD Endocrine:  No thryomegaly Vascular: No carotid bruits; FA pulses 2+ bilaterally without bruits  Cardiac:  normal S1, S2; IIRR; no murmur  Lungs:  clear to auscultation bilaterally, no wheezing, rhonchi or rales  Abd: soft, nontender, no hepatomegaly  Ext: no edema Musculoskeletal:  No deformities, BUE and BLE strength normal and equal Skin: warm and dry  Neuro:  CNs 2-12 intact, no focal abnormalities noted Psych:  Normal affect   EKG:  The EKG was personally reviewed and demonstrates: Atrial fibrillation with controlled ventricular response, occasional PVC, and no ischemic changes noted. Telemetry:  Telemetry was personally reviewed and demonstrates:  Atrial fib with controlled rate.  Relevant CV Studies:  Echocardiogram 12/14/15  Study Conclusions - Left ventricle: The cavity size was normal. Wall thickness was   increased in a pattern of moderate LVH. Systolic function was   normal. The estimated ejection fraction was in the range of 55% to 60%. Although no diagnostic regional wall motion abnormality was identified, this possibility cannot be completely excluded on the basis of this study. The study is not technically sufficient to allow evaluation of LV diastolic function. - Aortic valve: Mildly to moderately calcified annulus. Trileaflet;   mildly calcified leaflets. Moderate calcification involving the   left coronary cusp. There was mild to moderate stenosis based on planimetry and valve gradients. There was mild regurgitation. Mean gradient (S): 11 mm Hg. Peak gradient (S): 20 mm Hg. VTI ratio of LVOT to aortic valve: 0.47. Valve area (VTI): 1.33 cm^2. Valve area (Vmax): 1.41 cm^2. - Mitral valve: Calcified annulus. Moderately  calcified leaflets .   There was trivial regurgitation. - Left atrium: The atrium was moderately dilated. - Right ventricle: The cavity size was mildly dilated. - Right atrium: The atrium was moderately dilated. Central venous pressure (est): 3 mm Hg. - Atrial septum: No defect or patent foramen ovale was identified. - Tricuspid valve: There was mild regurgitation. - Pulmonary arteries: PA peak pressure: 33 mm Hg (S). - Pericardium, extracardiac: There was no pericardial effusion.  Impressions: - Moderate LVH with LVEF 55-60%. Indeterminate diastolic function   in the setting of atrial fibrillation. Moderate left atrial   enlargement. MAC with moderately calcified mitral leaflets and   trivial mitral regurgitation. Mild to moderate calcific aortic   stenosis with mild aortic regurgitation. Mild RV enlargement.   Moderate right atrial enlargement. Mild tricuspid regurgitation   with PASP 33 mmHg there is mercury.   Laboratory Data:  Chemistry Recent Labs  Lab 12/08/17 0609 12/11/17 1750  NA 137 137  K 4.1 5.0  CL 102 103  CO2 22 25  GLUCOSE 186* 97  BUN 14 13  CREATININE 0.95 0.82  CALCIUM 8.3* 8.2*  GFRNONAA >60 >60  GFRAA >60 >60  ANIONGAP 13 9    No results for input(s): PROT, ALBUMIN, AST, ALT, ALKPHOS, BILITOT in the last 168 hours. Hematology Recent Labs  Lab 12/08/17 0609 12/11/17 1750  WBC 16.5* 5.9  RBC 3.97* 3.56*  HGB 12.6* 11.4*  HCT 38.3* 34.1*  MCV 96.5 95.8  MCH 31.7 32.0  MCHC 32.9 33.4  RDW 12.5 12.8  PLT 186 185   Cardiac Enzymes Recent Labs  Lab 12/12/17 0301 12/12/17  1017  TROPONINI 0.84* 0.81*   No results for input(s): TROPIPOC in the last 168 hours.  BNP Recent Labs  Lab 12/12/17 1012  BNP 257.4*    DDimer No results for input(s): DDIMER in the last 168 hours.  Radiology/Studies:  Dg Chest 1 View  Result Date: 12/11/2017 CLINICAL DATA:  Shortness of Breath EXAM: CHEST 1 VIEW COMPARISON:  12/08/2017 FINDINGS:  Cardiomegaly. Bibasilar atelectasis. No effusions. Mild vascular congestion. No acute bony abnormality. IMPRESSION: Cardiomegaly with vascular congestion.  Bibasilar atelectasis. Electronically Signed   By: Rolm Baptise M.D.   On: 12/11/2017 20:22   Dg Esophagus  Result Date: 12/10/2017 CLINICAL DATA:  Dysphagia. EXAM: ESOPHOGRAM / BARIUM SWALLOW / BARIUM TABLET STUDY TECHNIQUE: Combined double contrast and single contrast examination performed using effervescent crystals, thick barium liquid, and thin barium liquid. The patient was observed with fluoroscopy swallowing a 13 mm barium sulphate tablet. FLUOROSCOPY TIME:  Fluoroscopy Time:  3.0 minutes. Radiation Exposure Index (if provided by the fluoroscopic device): 55.3 mGy. Number of Acquired Spot Images: 1 COMPARISON:  None. FINDINGS: The pharyngeal phase of swallowing was normal. Primary peristaltic waves in the esophagus were normal. Mild tertiary contractions. No obstruction to the forward flow of contrast throughout the esophagus and into the stomach. Normal esophageal course and contour. Normal esophageal mucosal pattern. There is a short segment, smooth, benign-appearing stricture of the distal esophagus just proximal to gastroesophageal junction. No esophageal ulceration or other significant abnormality. No hiatal hernia. No gastroesophageal reflux occurred spontaneously or was elicited. A 13 mm barium tablet did not pass into the stomach. IMPRESSION: 1. Short segment, smooth, benign-appearing stricture of the distal esophagus just proximal to the gastroesophageal junction. The barium tablet would not pass into the stomach. Electronically Signed   By: Titus Dubin M.D.   On: 12/10/2017 11:09   Dg Chest Port 1 View  Result Date: 12/09/2017 CLINICAL DATA:  Wheezing hypertension EXAM: PORTABLE CHEST 1 VIEW COMPARISON:  09/15/2015 FINDINGS: Postsurgical changes in the cervical spine. Moderate cardiomegaly with vascular congestion. No acute  infiltrate or effusion. No pneumothorax. IMPRESSION: Stable cardiomegaly with mild central congestion. No focal pulmonary infiltrate. Electronically Signed   By: Donavan Foil M.D.   On: 12/09/2017 00:11    Assessment and Plan:   Elevated troponin - likely demand related -Pt is S/P right knee replacement on 12/07/17. Developed diaphoresis, hypoxia and confusion yesterday around 5 pm. BP was 150/100 with heart rate of 131 bpm. No chest pain or shortness of breath. -No previous MI or ischemic evaluations -CXR shoed mild vascular congestion -Labs unremarkable except for elevated troponin of 0.84. Awaiting subsequent troponin values.  -Improved this morning.   Permanent atrial fibrillation -Rate controlled with Coreg 3.125 mg bid -On Chronic coumadin for stroke risk reduction. Was held for surgery and now resumed, pharmacy dosing. INR 1.66 today.   Chronic diastolic CHF -Echo in 0093 showed preserved EF 55-60%. Has remained stable on lasix 40 mg daily.   Hypertension -On losartan 100 mg daily, carvedilol 3.125 mg bid, lasix 40 mg daily -BP stable  Hyperlipidemia -atorvastatin 10 mg daily. Per PCP  Aortic Stenosis -Echo in 11/2015 showed mild-moderate stenosis with a mean gradient (S): 11 mmHg and valve area (VTI): 1.33 cm2   For questions or updates, please contact [place] Please consult [email or web address] for contact info under Cardiology/STEMI.   Signed, Sinclair Grooms, MD  12/12/2017 12:10 PM

## 2017-12-12 NOTE — Care Management Note (Signed)
Case Management Note  Patient Details  Name: Brent Mason MRN: [id number] Date of Birth: August 25, 1946  Subjective/Objective: Spoke to patient/dtr [name] EXNT([phone]) in rm about d/c plans-dtr is the contact for [facility] aware, & following for d/c order-HHRN/PT/aide. Dtr has all dme even though its ordered. Answered all questions from patient/family. No further CM needs.                    Action/Plan:dj/c home w/HHC.   Expected Discharge Date:  12/11/17               Expected Discharge Plan:  [place]  In-House Referral:  NA  Discharge planning Services  CM Consult  Post Acute Care Choice:  Home Health Choice offered to:  Patient  DME Arranged:    DME Agency:     HH Arranged:  PT, Nurse's Aide, RN [place] Agency:  [facility] [employer]  Status of Service:  Completed, signed off  If discussed at [employer], dates discussed:    Additional Comments:  Dessa Phi, RN 12/12/2017, 11:47 AM

## 2017-12-12 NOTE — Progress Notes (Addendum)
CRITICAL VALUE ALERT  Critical Value:  Troponin 0.84  Date & Time Notied: 12/12/17 0456  Provider Notified: Baltazar Najjar  Orders Received/Actions taken: assessed for chest pain. Repeat EKG at 0800

## 2017-12-12 NOTE — Progress Notes (Signed)
Right lower extremity venous duplex has been completed. Negative for DVT.  12/12/17 9:34 AM Brent Mason RVT

## 2017-12-12 NOTE — Progress Notes (Signed)
  Echocardiogram 2D Echocardiogram has been performed.  Brent Mason M 12/12/2017, 9:37 AM

## 2017-12-12 NOTE — Progress Notes (Signed)
Brent Mason 2:45 PM  Subjective: Events of last night noted today doing much better and no obvious problem from my procedure and is swallowing fine and no GI complaints  Objective: Vital signs stable afebrile no acute distress abdomen is soft nontender  Assessment: Status post dilation  Plan: Please let me know if I be of any further assistance with this hospital stay and happy see back as an outpatient as needed  [facility] E  Pager [phone] After 5PM or if no answer call [phone]

## 2017-12-12 NOTE — Progress Notes (Signed)
ANTICOAGULATION CONSULT NOTE  Pharmacy Consult for warfarin Indication: hx atrial fibrillation  No Known Allergies  Patient Measurements: Height: 6\' 2"  (188 cm) Weight: 277 lb (125.6 kg) IBW/kg (Calculated) : 82.2  Vital Signs: Temp: 97.6 F (36.4 C) (02/27 0327) Temp Source: Oral (02/27 0327) BP: 110/46 (02/27 0600) Pulse Rate: 52 (02/27 0600)  Labs: Recent Labs    12/10/17 0613 12/11/17 0602 12/11/17 1750 12/12/17 0301  HGB  --   --  11.4*  --   HCT  --   --  34.1*  --   PLT  --   --  185  --   LABPROT 17.7* 18.8*  --  19.4*  INR 1.47 1.59  --  1.66  CREATININE  --   --  0.82  --   TROPONINI  --   --   --  0.84*    Estimated Creatinine Clearance: 114.7 mL/min (by C-G formula based on SCr of 0.82 mg/dL).   Medications:  PTA warfarin regimen: 2.5 mg daily except 5 mg on MWF  Assessment: Patient is a 72 y.o M with hx of episodic dysphagia and afib on warfarin PTA presented [facility] on 12[facility]07[facility]17 for right TKA.  Warfarin was started post procedure on 12/07/17.  She had dysphagia with n/v on 2/24 and underwent endoscopy on 2/26 -- GI recommends to resume warfarin post procedure. - did not get warfarin dose on 2/25 12/12/2017:  INR rising to goal- 1.66 today  No bleeding noted  CBC: pltc WNL, Hg slightly low- likely due to ABLA  Eating 100% soft diet  No major drug interactions noted  Goal of Therapy:  INR 2-3   Plan:  - Warfarin 5 mg PO x1 today per home regimen - daily INR - monitor for s/sx bleeding  Brent Mason 12/12/2017,8:12 AM

## 2017-12-12 NOTE — Consult Note (Signed)
Medical Consultation   ONAJE Mason  RDE:[id number]  DOB: [birth year]  DOA: 12/07/2017  PCP: Redmond School, MD   Requesting physician: Dr. Jean Rosenthal  Reason for consultation: Diaphoresis, Hypoxia.   History of Present Illness: Brent Mason is an 72 y.o. male for HTN, esophageal strictures, atria fib on chronic anticoagulation who was admitted on 12/07/17, for right total knee arthroplasty, which was done that day, and patient had done well from his arthroplasty standpoint, but had a choking episode on 24th, hospitalist was consulted, GI was consulted, patient had a barium swallow which was abnormal, he had EGD today 2/26-year- revealed abnormal oesophageal motility likely from esophageal spasm, had dilatation. No strictures.  Patient was to be discharged today, but at about 5 PM, patient was suddenly diaphoretic, O2 sats 70s-80s, blood pressure 150/100, HR- 131.  Daughter present at bedside reports patient was confused, and the time of my examination about 1.5 hrs later this appeared to be improved. Patient denies chest pain or shortness of breath, has expected right knee pain. Pt on IVF, per pts nurse this was d/c this a.m. Patient was Restarted on IV fluids- NS, chest x-ray- port, BMP, CBC ordered by ortho.  Hospitalist was called to see in consult.  Review of Systems:  ROS  Past Medical History: Past Medical History:  Diagnosis Date  . Anemia   . Anticoagulated on Coumadin    long-term  . Chronic kidney disease    frequent uti's last uti few yrs ago  . Diastolic CHF, chronic ([place])   . Dysrhythmia   . GERD (gastroesophageal reflux disease)   . Headache   . Hiatal hernia   . History of adenomatous polyp of colon   . History of esophageal stricture    s/p  dilatations  . History of GI bleed    03/ 2003  per EGD gastritis  . HOH (hard of hearing)    both ears  . Hypertension   . Non-rheumatic aortic stenosis    per last echo 12-14-2015      . OA (osteoarthritis)    oa  . Persistent atrial fibrillation [facility]) first dx  1999   primary cardiologist-  dr taylor-- hx DCCV  . Pneumonia 2009   with cpap usage  . Shortness of breath    when exertion  . Sleep apnea    last sleep study > 5 yrs   no cpap used    Past Surgical History: Past Surgical History:  Procedure Laterality Date  . ABDOMINAL HERNIA REPAIR  1990s  . ANTERIOR CERVICAL DECOMP/DISCECTOMY FUSION N/A 02/14/2013   Procedure: ANTERIOR CERVICAL DECOMPRESSION/DISCECTOMY FUSION 2 LEVELS;  Surgeon: Charlie Pitter, MD;  Location: [place] NEURO ORS;  Service: Neurosurgery;  Laterality: N/A;  . BALLOON DILATION N/A 12/11/2017   Procedure: BALLOON DILATION;  Surgeon: Clarene Essex, MD;  Location: [facility] ENDOSCOPY;  Service: Endoscopy;  Laterality: N/A;  . COLONOSCOPY  last one 06-03-2012  . COLONOSCOPY  06/03/2012   Procedure: COLONOSCOPY;  Surgeon: Daneil Dolin, MD;  Location: [facility];  Service: Endoscopy;  Laterality: N/A;  8:30  . esophageal strecching  yrs ago  . ESOPHAGOGASTRODUODENOSCOPY (EGD) WITH PROPOFOL N/A 12/11/2017   Procedure: ESOPHAGOGASTRODUODENOSCOPY (EGD) WITH PROPOFOL;  Surgeon: Clarene Essex, MD;  Location: [facility] ENDOSCOPY;  Service: Endoscopy;  Laterality: N/A;  . ESOPHAGUS SURGERY  20 yrs ago   for food sticking  . FOOT SURGERY Right 04-27-2010  dr duda   joint arthrodesis  . right foot surgery  2018   to remove screw  . TOTAL KNEE ARTHROPLASTY Right 12/07/2017   Procedure: RIGHT TOTAL KNEE ARTHROPLASTY;  Surgeon: Mcarthur Rossetti, MD;  Location: [facility] ORS;  Service: Orthopedics;  Laterality: Right;  . TRANSTHORACIC ECHOCARDIOGRAM  12-14-2015  dr taylor   moderate LVH, ef 55-60%, indeterminate diastolic function in setting of AFib/  moderate LAE and RAE/  mild to moderate calcific aoric stenosis with mild AR (valve area 1.33cm^2, peak grandient34mmHg, mean grandient 81mmHg)/ trivial MR/ mild TR/ PASP 49mmHg  . VOLVULUS REDUCTION  4097,3532   Allergies:  No  Known Allergies  Social History:  reports that  has never smoked. he has never used smokeless tobacco. He reports that he does not drink alcohol or use drugs.  Family History: Family History  Problem Relation Age of Onset  . Heart disease Mother        Pacemaker  . Diabetes Father   . Colon cancer Neg Hx    Physical Exam: Vitals:   12/12/17 0800 12/12/17 1200 12/12/17 1600 12/12/17 1602  BP:    (!) 146/64  Pulse:      Resp:    (!) 21  Temp: 99 F (37.2 C) 97.7 F (36.5 C) 98.2 F (36.8 C)   TempSrc: Axillary Oral Axillary   SpO2:      Weight:      Height:        Constitutional:  Alert and awake, oriented x3, not in any acute distress. Eyes: PERLA, EOMI, irises appear normal, anicteric sclera,  ENMT: external ears and nose appear normal, hard of hearing            Lips appears normal, oropharynx mucosa, tongue, posterior pharynx appear normal  Neck: neck appears normal, no masses, normal ROM, no thyromegaly, no JVD  CVS: S1-S2 clear, no murmur rubs or gallops, no LE edema, normal pedal pulses  Respiratory: No crackles,  no wheezing, rales or rhonchi. Respiratory effort normal. No accessory muscle use.  Abdomen: soft nontender, nondistended, normal bowel sounds, no hepatosplenomegaly, no hernias  Musculoskeletal: : no cyanosis, clubbing or edema noted bilaterally Neuro: Cranial nerves II-XII intact, strength 5/5 all extremities, except for RLE_surgery, sensation intact,  Psych: judgement and insight appear normal, stable mood and affect, mental status-mildly confused asking to look for his glasses and significantly anxious for the same. Skin: no rashes or lesions or ulcers, no induration or nodules   Data reviewed:  I have personally reviewed following labs and imaging studies Labs:  CBC: Recent Labs  Lab 12/08/17 0609 12/11/17 1750  WBC 16.5* 5.9  HGB 12.6* 11.4*  HCT 38.3* 34.1*  MCV 96.5 95.8  PLT 186 992    Basic Metabolic Panel: Recent Labs  Lab  12/08/17 0609 12/11/17 1750  NA 137 137  K 4.1 5.0  CL 102 103  CO2 22 25  GLUCOSE 186* 97  BUN 14 13  CREATININE 0.95 0.82  CALCIUM 8.3* 8.2*   Coagulation profile Recent Labs  Lab 12/08/17 0609 12/09/17 0553 12/10/17 0613 12/11/17 0602 12/12/17 0301  INR 1.07 1.34 1.47 1.59 1.66    Microbiology No results found for this or any previous visit (from the past 240 hour(s)).     Inpatient Medications:   Scheduled Meds: . atorvastatin  10 mg Oral q1800  . carvedilol  3.125 mg Oral BID WC  . docusate sodium  100 mg Oral BID  . ferrous sulfate  325 mg  Oral Q breakfast  . fluticasone  1 spray Each Nare Daily  . [START ON 12/13/2017] furosemide  40 mg Oral Daily  . losartan  100 mg Oral Daily  . pantoprazole  80 mg Oral Daily  . Warfarin - Pharmacist Dosing Inpatient   Does not apply q1800   Continuous Infusions: . methocarbamol (ROBAXIN)  IV Stopped (12/07/17 1127)     Radiological Exams on Admission: Dg Chest 1 View  Result Date: 12/11/2017 CLINICAL DATA:  Shortness of Breath EXAM: CHEST 1 VIEW COMPARISON:  12/08/2017 FINDINGS: Cardiomegaly. Bibasilar atelectasis. No effusions. Mild vascular congestion. No acute bony abnormality. IMPRESSION: Cardiomegaly with vascular congestion.  Bibasilar atelectasis. Electronically Signed   By: Rolm Baptise M.D.   On: 12/11/2017 20:22    Impression/Recommendations Principal Problem:   Unilateral primary osteoarthritis, right knee Active Problems:   ESOPHAGEAL STRICTURE   Status post total right knee replacement  Diaphoresis, Hypoxia- resolved. Per pts nurse-O2 sats- high 70s- 80s, improved on 2L , BP was 009F systolic. Tachycardia to 130s resolving. No chest pain, no SOB. Chest xray showed vasc congestion- Pt on IVF 75cc/hr ordered 2/22, d/c this a.m. Also home lasix 40mg  given daily except today. Received 10mg  hydrocodone 2 hrs prior, likely contributing. BMP CBC unremarkable. - EKG- Incomplete RBBB, Unchanged from prior. -  Reduce hydrocodone to 5mg , D/c oxycodone 10. -Mildly elevated troponin, cardiology consulted, appreciate input.  Echocardiogram 60-65% EF, no wall motion abnormality.  Patient does not have any chest pain.  Clinically better symptomatically. Continue current treatment also continue diuresis.  Right Lower extremity swelling-likely related to immobility s/p TKA -Right lower extremity Dopplers negative for DVT  A. fib with RVR resolved. HR <110. INR- 1.6.  -Resume warfarin per pharmacy dosing, following ortho and GI recs -Continue prior-Coreg  3.125 BID  Oesophageal dilatation- EGD, with dilatation.  Concern for choking episode and possible aspiration 12/09/17.  Chest x-ray does not show infiltrate. WBC- normal 5.9. -Follow-up outpatient  Right Total knee arthroplasty- -per ortho  Thank you for this consultation.  Our [facility] hospitalist team will follow the patient with you.   Berle Mull M.D. [facility] 12/12/2017, 6:34 PM

## 2017-12-12 NOTE — Care Management Note (Signed)
Case Management Note  Patient Details  Name: Brent Mason MRN: [id number] Date of Birth: Dec 17, 1945  Subjective/Objective: Per nsg will await family to go over [facility], DME orders, & services.[facility] rep Lattie Haw already following. [facility] dme rep Jermaine following for dme.                   Action/Plan:d/c plan home w/HHC/dme   Expected Discharge Date:  12/11/17               Expected Discharge Plan:  [place]  In-House Referral:  NA  Discharge planning Services  CM Consult  Post Acute Care Choice:  Home Health Choice offered to:  Patient  DME Arranged:  3-N-1, Walker rolling DME Agency:  [place]:  PT, Nurse's Aide [place] Agency:  [facility] (formerly [employer])  Status of Service:  In process, will continue to follow  If discussed at Long Length of Stay Meetings, dates discussed:    Additional Comments:  Dessa Phi, RN 12/12/2017, 10:12 AM

## 2017-12-12 NOTE — Progress Notes (Signed)
Patient ID: Brent Mason, male   DOB: 28-Jun-1946, 72 y.o.   MRN: [id number] Awake and alert this am.  Denies chest pain and denies shortness of breath.  Appreciate [facility] consult.  Cardiology potentially seeing the patient this am.  Will order physical therapy again to help mobilize the patient today and hopefully will make progress toward discharge latter today vs tomorrow pending his improvement medically.  If continues to remain stable, can return to regular floor bed.

## 2017-12-12 NOTE — Progress Notes (Signed)
PT Cancellation Note  Patient Details Name: Brent Mason MRN: [id number] DOB: 1946/09/11   Cancelled Treatment:     pt transferred to step down.  [facility] now following and ordered work up. Will attempt to see tomorrow am.    Rica Koyanagi  PTA [facility]  Acute  Rehab Pager      [phone]

## 2017-12-13 DIAGNOSIS — I5022 Chronic systolic (congestive) heart failure: Secondary | ICD-10-CM

## 2017-12-13 LAB — BASIC METABOLIC PANEL
ANION GAP: 8 (ref 5–15)
BUN: 17 mg/dL (ref 6–20)
CO2: 26 mmol/L (ref 22–32)
Calcium: 8.4 mg/dL — ABNORMAL LOW (ref 8.9–10.3)
Chloride: 101 mmol/L (ref 101–111)
Creatinine, Ser: 0.9 mg/dL (ref 0.61–1.24)
GFR calc Af Amer: >60 mL/min (ref >60)
GFR calc non Af Amer: >60 mL/min (ref >60)
Glucose, Bld: 109 mg/dL — ABNORMAL HIGH (ref 65–99)
POTASSIUM: 3.7 mmol/L (ref 3.5–5.1)
Sodium: 135 mmol/L (ref 135–145)

## 2017-12-13 LAB — PROTIME-INR
INR: 2.01
PROTHROMBIN TIME: 22.6 s — AB (ref 11.4–15.2)

## 2017-12-13 MED ORDER — POLYETHYLENE GLYCOL 3350 17 G PO PACK: 17.0000 g | PACK | Freq: Every day | ORAL | 0 refills | Status: DC | PRN

## 2017-12-13 MED ORDER — WARFARIN SODIUM 2.5 MG PO TABS: 2.5000 mg | ORAL_TABLET | Freq: Once | ORAL | Status: DC

## 2017-12-13 NOTE — Progress Notes (Signed)
ANTICOAGULATION CONSULT NOTE  Pharmacy Consult for warfarin Indication: hx atrial fibrillation  No Known Allergies  Patient Measurements: Height: 6\' 2"  (188 cm) Weight: 277 lb (125.6 kg) IBW/kg (Calculated) : 82.2  Vital Signs: Temp: 98.6 F (37 C) (02/28 0700) Temp Source: Oral (02/28 0700) BP: 113/85 (02/28 0600) Pulse Rate: 79 (02/28 0600)  Labs: Recent Labs    12/11/17 0602 12/11/17 1750 12/12/17 0301 12/12/17 1017 12/12/17 1657 12/13/17 0301  HGB  --  11.4*  --   --   --   --   HCT  --  34.1*  --   --   --   --   PLT  --  185  --   --   --   --   LABPROT 18.8*  --  19.4*  --   --  22.6*  INR 1.59  --  1.66  --   --  2.01  CREATININE  --  0.82  --   --   --  0.90  TROPONINI  --   --  0.84* 0.81* 0.54*  --     Estimated Creatinine Clearance: 104.5 mL/min (by C-G formula based on SCr of 0.9 mg/dL).   Medications:  PTA warfarin regimen: 2.5 mg daily except 5 mg on MWF  Assessment: Patient is a 72 y.o M with hx of episodic dysphagia and afib on warfarin PTA presented to [facility] on 12/07[facility]17 for right TKA.  Warfarin was started post procedure on 12/07/17.  She had dysphagia with n/v on 2/24 and underwent endoscopy on 2/26 -- GI recommends to resume warfarin post procedure. - did not get warfarin dose on 2/25 12/13/2017:  INR therapeutic today- 2.01  No bleeding noted  CBC: pltc WNL, Hg slightly low- likely due to ABL (last checked 2/26)  Eating 100% soft diet  No major drug interactions noted  Goal of Therapy:  INR 2-3   Plan:  - Warfarin 2.5 mg PO x1 today per home regimen - daily INR - monitor for s/sx bleeding  Dom Haverland, Lavonia Drafts 12/13/2017,2:53 PM

## 2017-12-13 NOTE — Progress Notes (Signed)
Patient ID: Brent Mason, male   DOB: 1946/09/09, 72 y.o.   MRN: [id number] No acute changes over the past 24 hours.  Troponin trended down.  He denies any chest pain or shortness of breath.  His vitals are stable.  Can be discharged to home today.

## 2017-12-13 NOTE — Discharge Summary (Signed)
Patient ID: Brent Mason MRN: [id number] DOB/AGE: Aug 01, 1946 72 y.o.  Admit date: 12/07/2017 Discharge date: 12/13/2017  Admission Diagnoses:  Principal Problem:   Unilateral primary osteoarthritis, right knee Active Problems:   ESOPHAGEAL STRICTURE   Status post total right knee replacement   Discharge Diagnoses:  Same  Past Medical History:  Diagnosis Date  . Anemia   . Anticoagulated on Coumadin    long-term  . Chronic kidney disease    frequent uti's last uti few yrs ago  . Diastolic CHF, chronic ([place])   . Dysrhythmia   . GERD (gastroesophageal reflux disease)   . Headache   . Hiatal hernia   . History of adenomatous polyp of colon   . History of esophageal stricture    s/p  dilatations  . History of GI bleed    03/ 2003  per EGD gastritis  . HOH (hard of hearing)    both ears  . Hypertension   . Non-rheumatic aortic stenosis    per last echo 12-14-2015    . OA (osteoarthritis)    oa  . Persistent atrial fibrillation [facility] first dx  1999   primary cardiologist-  dr taylor-- hx DCCV  . Pneumonia 2009   with cpap usage  . Shortness of breath    when exertion  . Sleep apnea    last sleep study > 5 yrs   no cpap used    Surgeries: Procedure(s): ESOPHAGOGASTRODUODENOSCOPY (EGD) WITH PROPOFOL BALLOON DILATION on 12/07/2017 - 12/11/2017   Consultants: Treatment Team:  Clarene Essex, MD [facility], [place], MD  Discharged Condition: Improved  Hospital Course: Brent Mason is an 72 y.o. male who was admitted 12/07/2017 for operative treatment ofUnilateral primary osteoarthritis, right knee. Patient has severe unremitting pain that affects sleep, daily activities, and work/hobbies. After pre-op clearance the patient was taken to the operating room on 12/07/2017 - 12/11/2017 and underwent  Procedure(s): ESOPHAGOGASTRODUODENOSCOPY (EGD) WITH PROPOFOL BALLOON DILATION.    Patient was given perioperative antibiotics:  Anti-infectives (From admission,  onward)   Start     Dose/Rate Route Frequency Ordered Stop   12/07/17 1600  ceFAZolin (ANCEF) IVPB 1 g/50 mL premix     1 g 100 mL/hr over 30 Minutes Intravenous Every 6 hours 12/07/17 1235 12/07/17 2249   12/07/17 0600  ceFAZolin (ANCEF) 3 g in dextrose 5 % 50 mL IVPB     3 g 130 mL/hr over 30 Minutes Intravenous On call to O.R. 12/06/17 1307 12/07/17 0915       Patient was given sequential compression devices, early ambulation, and chemoprophylaxis to prevent DVT. During postoperative day 1 the patient did have a choking episode.  The GI service was consulted.  This past Monday and barium swallow was performed and there was question whether or not he had an esophageal stricture.  On Tuesday he was taken to the GI suite with a cyst this is just about decreased motility.  He was scheduled for discharge to the afternoon but became diaphoretic with low sats.  He was seen by the rapid response team and transferred to the stepdown unit with A. fib with RVR.  However his A. fib was chronic and he returned immediately to a normal rate.  Both the hospitalist service and cardiology was consulted and no other changes were made to his medical status she makes her Lasix.  All his labs remain normal.  By the day of discharge he was mobilizing well and denied chest pain or shortness of breath.  It was felt he could be discharged safely to home.  Recent vital signs:  Patient Vitals for the past 24 hrs:  BP Temp Temp src Pulse Resp SpO2  12/13/17 0600 113/85 - - 79 15 98 %  12/13/17 0515 137/68 - - 71 19 96 %  12/13/17 0403 - 97.9 F (36.6 C) Oral - - -  12/13/17 0000 133/69 - - 78 16 92 %  12/12/17 2356 - 98 F (36.7 C) Oral - - -  12/12/17 2003 - 97.6 F (36.4 C) Oral - - -  12/12/17 2000 (!) 98/58 - - 70 (!) 22 96 %  12/12/17 1602 (!) 146/64 - - - (!) 21 -  12/12/17 1600 - 98.2 F (36.8 C) Axillary - - -  12/12/17 1200 - 97.7 F (36.5 C) Oral - - -  12/12/17 0800 - 99 F (37.2 C) Axillary - - -      Recent laboratory studies:  Recent Labs    12/11/17 1750 12/12/17 0301 12/13/17 0301  WBC 5.9  --   --   HGB 11.4*  --   --   HCT 34.1*  --   --   PLT 185  --   --   NA 137  --  135  K 5.0  --  3.7  CL 103  --  101  CO2 25  --  26  BUN 13  --  17  CREATININE 0.82  --  0.90  GLUCOSE 97  --  109*  INR  --  1.66 2.01  CALCIUM 8.2*  --  8.4*     Discharge Medications:   Allergies as of 12/13/2017   No Known Allergies     Medication List    STOP taking these medications   HYDROcodone-acetaminophen 10-325 MG tablet Commonly known as:  NORCO     TAKE these medications   atorvastatin 10 MG tablet Commonly known as:  LIPITOR Take 10 mg by mouth daily.   carvedilol 3.125 MG tablet Commonly known as:  COREG Take 1 tablet (3.125 mg total) by mouth 2 (two) times daily with a meal.   ferrous sulfate 325 (65 FE) MG tablet Take 325 mg by mouth daily with breakfast.   fluticasone 50 MCG/ACT nasal spray Commonly known as:  FLONASE Place 1 spray into the nose daily.   furosemide 40 MG tablet Commonly known as:  LASIX Take 1 tablet (40 mg total) by mouth daily.   losartan 100 MG tablet Commonly known as:  COZAAR Take 100 mg by mouth daily.   methocarbamol 500 MG tablet Commonly known as:  ROBAXIN Take 1 tablet (500 mg total) by mouth every 6 (six) hours as needed for muscle spasms.   omeprazole 20 MG capsule Commonly known as:  PRILOSEC Take 20 mg by mouth at bedtime.   oxyCODONE 5 MG immediate release tablet Commonly known as:  ROXICODONE Take 1-2 tablets (5-10 mg total) by mouth every 4 (four) hours as needed.   warfarin 5 MG tablet Commonly known as:  COUMADIN Take as directed. If you are unsure how to take this medication, talk to your nurse or doctor. Original instructions:  TAKE ONE TABLET BY MOUTH ONCE DAILY, EXCEPT TUESDAYS AND THURSDAYS TAKE 1/2 TABLET. What changed:  See the new instructions.            Durable Medical Equipment  (From  admission, onward)        Start     Ordered   12/07/17 1236  DME 3 n 1  Once     12/07/17 1235   12/07/17 1236  DME Walker rolling  Once    Question:  Patient needs a walker to treat with the following condition  Answer:  Status post total right knee replacement   12/07/17 1235      Diagnostic Studies: Dg Chest 1 View  Result Date: 12/11/2017 CLINICAL DATA:  Shortness of Breath EXAM: CHEST 1 VIEW COMPARISON:  12/08/2017 FINDINGS: Cardiomegaly. Bibasilar atelectasis. No effusions. Mild vascular congestion. No acute bony abnormality. IMPRESSION: Cardiomegaly with vascular congestion.  Bibasilar atelectasis. Electronically Signed   By: Rolm Baptise M.D.   On: 12/11/2017 20:22   Dg Esophagus  Result Date: 12/10/2017 CLINICAL DATA:  Dysphagia. EXAM: ESOPHOGRAM / BARIUM SWALLOW / BARIUM TABLET STUDY TECHNIQUE: Combined double contrast and single contrast examination performed using effervescent crystals, thick barium liquid, and thin barium liquid. The patient was observed with fluoroscopy swallowing a 13 mm barium sulphate tablet. FLUOROSCOPY TIME:  Fluoroscopy Time:  3.0 minutes. Radiation Exposure Index (if provided by the fluoroscopic device): 55.3 mGy. Number of Acquired Spot Images: 1 COMPARISON:  None. FINDINGS: The pharyngeal phase of swallowing was normal. Primary peristaltic waves in the esophagus were normal. Mild tertiary contractions. No obstruction to the forward flow of contrast throughout the esophagus and into the stomach. Normal esophageal course and contour. Normal esophageal mucosal pattern. There is a short segment, smooth, benign-appearing stricture of the distal esophagus just proximal to gastroesophageal junction. No esophageal ulceration or other significant abnormality. No hiatal hernia. No gastroesophageal reflux occurred spontaneously or was elicited. A 13 mm barium tablet did not pass into the stomach. IMPRESSION: 1. Short segment, smooth, benign-appearing stricture of the  distal esophagus just proximal to the gastroesophageal junction. The barium tablet would not pass into the stomach. Electronically Signed   By: Titus Dubin M.D.   On: 12/10/2017 11:09   Dg Chest Port 1 View  Result Date: 12/09/2017 CLINICAL DATA:  Wheezing hypertension EXAM: PORTABLE CHEST 1 VIEW COMPARISON:  09/15/2015 FINDINGS: Postsurgical changes in the cervical spine. Moderate cardiomegaly with vascular congestion. No acute infiltrate or effusion. No pneumothorax. IMPRESSION: Stable cardiomegaly with mild central congestion. No focal pulmonary infiltrate. Electronically Signed   By: Donavan Foil M.D.   On: 12/09/2017 00:11   Dg Knee Right Port  Result Date: 12/07/2017 CLINICAL DATA:  Total knee replacement. EXAM: PORTABLE RIGHT KNEE - 1-2 VIEW COMPARISON:  05/28/2017. FINDINGS: Total right knee replacement. Hardware intact. Anatomic alignment. No acute bony abnormality. Peripheral vascular calcification. IMPRESSION: 1.  Total right knee replacement anatomic alignment. 2.  Peripheral vascular disease. Electronically Signed   By: Marcello Moores  Register   On: 12/07/2017 12:01    Disposition: 01-Home or Self Care  Discharge Instructions    Discharge patient   Complete by:  As directed    Can discharge to home later this afternoon after his GI procedure.   Discharge disposition:  01-Home or Self Care   Discharge patient date:  12/11/2017   Discharge patient   Complete by:  As directed    Discharge to home early afternoon today.   Discharge disposition:  01-Home or Self Care   Discharge patient date:  12/13/2017      Follow-up Information    Mcarthur Rossetti, MD Follow up in 2 week(s).   Specialty:  Orthopedic Surgery Contact information: [place] [address] [phone]        [facility] Follow up.   Specialty:  Home Health Services Why:  Home Health Nursing/Physical Therapy/ aide- agency will call to arrange initial visit Contact  information: [place] [address] [phone]            Signed: Mcarthur Rossetti 12/13/2017, 7:05 AM

## 2017-12-13 NOTE — Progress Notes (Signed)
[facility] Consultation Progress Note  Patient: Brent Mason NLG:[id number]   PCP: Redmond School, MD DOB: 1946/02/09   DOA: 12/07/2017   DOS: 12/13/2017   Date of Service: the patient was seen and examined on 12/13/2017 Primary service: Mcarthur Rossetti,*   Subjective: feeling better no acute complains.   Brief hospital course: [name] is an 72 y.o. male for HTN, esophageal strictures, atria fib on chronic anticoagulation who was admitted on 12/07/17, for right total knee arthroplasty, which was done that day, and patient had done well from his arthroplasty standpoint, but had a choking episode on 24th, hospitalist was consulted, GI was consulted, patient had a barium swallow which was abnormal, he had EGD today 2/26-year- revealed abnormal oesophageal motility likely from esophageal spasm, had dilatation. No strictures.  Patient was to be discharged today, but at about 5 PM, patient was suddenly diaphoretic, O2 sats 70s-80s, blood pressure 150/100, HR- 131.  Daughter present at bedside reports patient was confused, and the time of my examination about 1.5 hrs later this appeared to be improved. Patient denies chest pain or shortness of breath, has expected right knee pain. Pt on IVF, per pts nurse this was d/c this a.m. Patient was Restarted on IV fluids- NS, chest x-ray- port, BMP, CBC ordered by ortho.  Hospitalist was called to see in consult.  Assessment and Plan: Diaphoresis, Hypoxia- resolved. Per pts nurse-O2 sats- high 70s- 80s, improved on 2L , BP was 081K systolic. Tachycardia to 130s resolving. No chest pain, no SOB. Chest xray showed vasc congestion- Pt on IVF 75cc/hr ordered 2/22, d/c this a.m. Also home lasix 40mg  given daily except today. Received 10mg  hydrocodone 2 hrs prior, likely contributing. BMP CBC unremarkable. - EKG- Incomplete RBBB, Unchanged from prior. - Reduce hydrocodone to 5mg , D/c oxycodone 10. -Mildly elevated troponin, cardiology  consulted, appreciate input.  Echocardiogram 60-65% EF, no wall motion abnormality.  Patient does not have any chest pain.  Clinically better symptomatically. Continue current treatment also continue diuresis.  Right Lower extremity swelling-likely related to immobility s/p TKA -Right lower extremity Dopplers negative for DVT  A. fib with RVR resolved. HR <110. INR- 1.6.  -Resume warfarin per pharmacy dosing, following ortho and GI recs -Continue prior-Coreg  3.125 BID  Oesophageal dilatation- EGD, with dilatation.  Concern for choking episode and possible aspiration 12/09/17.  Chest x-ray does not show infiltrate. WBC- normal 5.9. -Follow-up outpatient  Right Total knee arthroplasty- -per ortho  Diet: cardiac diet DVT Prophylaxis: therapeutic anticoagulation.  Family Communication: no family was present at bedside, at the time of interview.  Disposition: We will sign off at present, please call us again as needed.  Recommendation on discharge: Continue prior home meds and maintain follow up with PCP  Other Consultants: cardiology Procedures: Echocardiogram Antibiotics: Anti-infectives (From admission, onward)   Start     Dose/Rate Route Frequency Ordered Stop   12/07/17 1600  ceFAZolin (ANCEF) IVPB 1 g/50 mL premix     1 g 100 mL/hr over 30 Minutes Intravenous Every 6 hours 12/07/17 1235 12/07/17 2249   12/07/17 0600  ceFAZolin (ANCEF) 3 g in dextrose 5 % 50 mL IVPB     3 g 130 mL/hr over 30 Minutes Intravenous On call to O.R. 12/06/17 1307 12/07/17 0915      Objective: Physical Exam: Vitals:   12/13/17 0403 12/13/17 0515 12/13/17 0600 12/13/17 0700  BP:  137/68 113/85   Pulse:  71 79   Resp:  19 15  Temp: 97.9 F (36.6 C)   98.6 F (37 C)  TempSrc: Oral   Oral  SpO2:  96% 98%   Weight:      Height:        Intake/Output Summary (Last 24 hours) at 12/13/2017 1145 Last data filed at 12/13/2017 1100 Gross per 24 hour  Intake 580 ml  Output 2075 ml  Net  -1495 ml   Filed Weights   12/07/17 0705 12/11/17 1241  Weight: 125.6 kg (277 lb) 125.6 kg (277 lb)   General: Alert, Awake and Oriented to Time, Place and Person. Appear in no distress, affect appropriate Eyes: PERRL, Conjunctiva normal ENT: Oral Mucosa clear moist Neck: no JVD, no Abnormal Mass Or lumps Cardiovascular: S1 and S2 Present, no Murmur, Peripheral Pulses Present Respiratory: normal respiratory effort, Bilateral Air entry equal and Decreased, no use of accessory muscle, Clear to Auscultation, no Crackles, no wheezes Abdomen: Bowel Sound present, Soft and no tenderness, no hernia Skin: no redness, no Rash, no induration Extremities: trace Pedal edema, no calf tenderness Neurologic: Grossly no focal neuro deficit. Bilaterally Equal motor strength  Data Reviewed: CBC: Recent Labs  Lab 12/08/17 0609 12/11/17 1750  WBC 16.5* 5.9  HGB 12.6* 11.4*  HCT 38.3* 34.1*  MCV 96.5 95.8  PLT 186 161   Basic Metabolic Panel: Recent Labs  Lab 12/08/17 0609 12/11/17 1750 12/13/17 0301  NA 137 137 135  K 4.1 5.0 3.7  CL 102 103 101  CO2 22 25 26   GLUCOSE 186* 97 109*  BUN 14 13 17   CREATININE 0.95 0.82 0.90  CALCIUM 8.3* 8.2* 8.4*   Liver Function Tests: No results for input(s): AST, ALT, ALKPHOS, BILITOT, PROT, ALBUMIN in the last 168 hours. No results for input(s): LIPASE, AMYLASE in the last 168 hours. No results for input(s): AMMONIA in the last 168 hours. Cardiac Enzymes: Recent Labs  Lab 12/12/17 0301 12/12/17 1017 12/12/17 1657  TROPONINI 0.84* 0.81* 0.54*   BNP (last 3 results) Recent Labs    12/12/17 1012  BNP 257.4*   CBG: No results for input(s): GLUCAP in the last 168 hours. No results found for this or any previous visit (from the past 240 hour(s)).  Studies: No results found.   Scheduled Meds: . atorvastatin  10 mg Oral q1800  . carvedilol  3.125 mg Oral BID WC  . docusate sodium  100 mg Oral BID  . ferrous sulfate  325 mg Oral Q  breakfast  . fluticasone  1 spray Each Nare Daily  . furosemide  40 mg Oral Daily  . losartan  100 mg Oral Daily  . pantoprazole  80 mg Oral Daily  . Warfarin - Pharmacist Dosing Inpatient   Does not apply q1800   Continuous Infusions: . methocarbamol (ROBAXIN)  IV Stopped (12/07/17 1127)   PRN Meds: acetaminophen **OR** acetaminophen, alum & mag hydroxide-simeth, diphenhydrAMINE, HYDROcodone-acetaminophen, ipratropium-albuterol, menthol-cetylpyridinium **OR** phenol, methocarbamol **OR** methocarbamol (ROBAXIN)  IV, metoCLOPramide **OR** metoCLOPramide (REGLAN) injection, ondansetron **OR** ondansetron (ZOFRAN) IV, polyethylene glycol  Time spent: 30 minutes  Author: Berle Mull, MD [facility] Pager: [phone] 12/13/2017 11:45 AM  If 7PM-7AM, please contact night-coverage at [email or web address], password [email or web address]

## 2017-12-13 NOTE — Progress Notes (Signed)
Pt discharged on Coumadin, dose clarified with Dr Ninfa Linden... take as ttaking prior to admission . 5 mg Monday, Wed, Firi  then 2.5 mg Tues, Thurs, Sat, Sun. Daughter [name] called and clarified dose, she was able to repeat the dose back to me.

## 2017-12-13 NOTE — Progress Notes (Signed)
Physical Therapy Treatment Patient Details Name: Brent Mason MRN: [id number] DOB: August 10, 1946 Today's Date: 12/13/2017    History of Present Illness Pt is a 72 year old male s/p R TKA    PT Comments    POD # 6  Assisted OOB to amb a functional distance then performed some TKR TE's following HEP handout.  Instructed on proper tech, freq as well as use of ICE.  Instructed on importance of knee extension as pt is lacking about 15 degrees.  Educated on proper positioning  and knee presses.  Addressed all mobility goals.  Pt has met goals to D/C to home.     Follow Up Recommendations  Home health PT     Equipment Recommendations       Recommendations for Other Services       Precautions / Restrictions Precautions Precautions: Knee Precaution Comments: pt able to perfrom  SLR so did not use Restrictions Weight Bearing Restrictions: No Other Position/Activity Restrictions: WBAT    Mobility  Bed Mobility Overal bed mobility: Needs Assistance Bed Mobility: Supine to Sit     Supine to sit: Min guard;Supervision     General bed mobility comments: increased time pt able to self perform   Transfers Overall transfer level: Needs assistance Equipment used: Rolling walker (2 wheeled) Transfers: Sit to/from [facility] Sit to Stand: Supervision Stand pivot transfers: Supervision       General transfer comment: increased time and <25% VC's on safety with turns  Ambulation/Gait Ambulation/Gait assistance: Min guard Ambulation Distance (Feet): 38 Feet Assistive device: Rolling walker (2 wheeled) Gait Pattern/deviations: Step-to pattern;Decreased stance time - right Gait velocity: decreased   General Gait Details: amb without KI a functional distance with 25% VC's for upright posture and safety with turns   Stairs    has a ramp from when spouse needed it         Wheelchair Mobility    Modified Rankin (Stroke Patients Only)       Balance                                             Cognition Arousal/Alertness: Awake/alert Behavior During Therapy: WFL for tasks assessed/performed Overall Cognitive Status: Within Functional Limits for tasks assessed                                 General Comments: eager to go home       Exercises   Total Knee Replacement TE's 10 reps B LE ankle pumps 10 reps towel squeezes 10 reps knee presses 10 reps heel slides  10 reps SAQ's 10 reps SLR's 10 reps ABD Followed by ICE     General Comments        Pertinent Vitals/Pain Pain Assessment: 0-10 Pain Score: 8  Pain Location: right knee Pain Descriptors / Indicators: Grimacing;Operative site guarding Pain Intervention(s): Monitored during session;Repositioned;Premedicated before session;Ice applied    Home Living                      Prior Function            PT Goals (current goals can now be found in the care plan section) Progress towards PT goals: Progressing toward goals    Frequency    7X/week  PT Plan Current plan remains appropriate    Co-evaluation              AM-PAC PT "6 Clicks" Daily Activity  Outcome Measure  Difficulty turning over in bed (including adjusting bedclothes, sheets and blankets)?: A Little Difficulty moving from lying on back to sitting on the side of the bed? : A Little Difficulty sitting down on and standing up from a chair with arms (e.g., wheelchair, bedside commode, etc,.)?: A Little Help needed moving to and from a bed to chair (including a wheelchair)?: A Little Help needed walking in hospital room?: A Little Help needed climbing 3-5 steps with a railing? : A Little 6 Click Score: 18    End of Session Equipment Utilized During Treatment: Gait belt Activity Tolerance: Patient tolerated treatment well Patient left: in chair Nurse Communication: (pt ready for D/C to home ) PT Visit Diagnosis: Other abnormalities of gait and  mobility (R26.89)     Time: 1010-1038 PT Time Calculation (min) (ACUTE ONLY): 28 min  Charges:  $Gait Training: 8-22 mins $Therapeutic Exercise: 8-22 mins                    G Codes:       Rica Koyanagi  PTA [facility]  Acute  Rehab Pager      [phone]

## 2017-12-13 NOTE — Progress Notes (Addendum)
CARDIOLOGY   No evidence of DVT on lower extremity Doppler study.  Continue Coumadin for stroke prophylaxis.  Should resume cardiac medications as previously prescribed.  Discussed the importance of furosemide dosing as prescribed.  Long discussion with patient and his daughter concerning heart failure and benefit of compliance with medical regimen.

## 2017-12-14 ENCOUNTER — Telehealth (INDEPENDENT_AMBULATORY_CARE_PROVIDER_SITE_OTHER): Payer: Self-pay | Admitting: Orthopedic Surgery

## 2017-12-14 ENCOUNTER — Ambulatory Visit (INDEPENDENT_AMBULATORY_CARE_PROVIDER_SITE_OTHER): Payer: Medicare Other | Admitting: Cardiovascular Disease

## 2017-12-14 DIAGNOSIS — Z5181 Encounter for therapeutic drug level monitoring: Secondary | ICD-10-CM

## 2017-12-14 DIAGNOSIS — N189 Chronic kidney disease, unspecified: Secondary | ICD-10-CM | POA: Diagnosis not present

## 2017-12-14 DIAGNOSIS — D631 Anemia in chronic kidney disease: Secondary | ICD-10-CM | POA: Diagnosis not present

## 2017-12-14 DIAGNOSIS — I481 Persistent atrial fibrillation: Secondary | ICD-10-CM | POA: Diagnosis not present

## 2017-12-14 DIAGNOSIS — Z471 Aftercare following joint replacement surgery: Secondary | ICD-10-CM | POA: Diagnosis not present

## 2017-12-14 DIAGNOSIS — I130 Hypertensive heart and chronic kidney disease with heart failure and stage 1 through stage 4 chronic kidney disease, or unspecified chronic kidney disease: Secondary | ICD-10-CM | POA: Diagnosis not present

## 2017-12-14 DIAGNOSIS — I5032 Chronic diastolic (congestive) heart failure: Secondary | ICD-10-CM | POA: Diagnosis not present

## 2017-12-14 LAB — POCT INR: INR: 2.6

## 2017-12-14 NOTE — Telephone Encounter (Signed)
Estill Bamberg, home health nurse, was at Brent Mason's home with his family doing his home health assessment.  She states that his family is very concerned about his "breakthrough pain".  Brent Mason is on oxycodone and Robaxin and is still in a lot of pain a few hours after taking the medication.  They are asking for recommendations on what he can do to help with that.  I called Dr. Ninfa Linden to discuss.  He advised that Brent Mason could take a 3rd oxycodone tablet with either his morning dose or evening dose, but do this sparingly.  Estill Bamberg stated understanding and will relay information to Brent Mason and his family.

## 2017-12-17 ENCOUNTER — Telehealth (INDEPENDENT_AMBULATORY_CARE_PROVIDER_SITE_OTHER): Payer: Self-pay | Admitting: Orthopaedic Surgery

## 2017-12-17 NOTE — Telephone Encounter (Signed)
Patients daughter called, left a message on you voicemail but she is needing that 25 dollar check back. If you could give her a call back as soon as possible. CB# [phone] ([facility]) or cell phone [phone]

## 2017-12-17 NOTE — Telephone Encounter (Signed)
Amanda from [facility] called asking for verbal approval on nursing and pain management 2 wek 2 and 1 week 2. 2 PR. CB # [phone]

## 2017-12-17 NOTE — Telephone Encounter (Signed)
Verbal order given  

## 2017-12-17 NOTE — Telephone Encounter (Signed)
Joey-(PT) with [facility] called needing verbal orders for plan of care 3 wk 2. Joey advised patient is doing well with range of motion and all exercises. The number to contact Joey is [phone]

## 2017-12-17 NOTE — Telephone Encounter (Signed)
Just spoke with patient and she is just wanting to make sure the check wasn't cashed or anything? CB # [phone]

## 2017-12-17 NOTE — Telephone Encounter (Signed)
Verbal left on VM 

## 2017-12-17 NOTE — Telephone Encounter (Signed)
Thank you :)

## 2017-12-20 ENCOUNTER — Encounter (INDEPENDENT_AMBULATORY_CARE_PROVIDER_SITE_OTHER): Payer: Self-pay | Admitting: Orthopaedic Surgery

## 2017-12-20 ENCOUNTER — Telehealth: Payer: Self-pay | Admitting: *Deleted

## 2017-12-20 ENCOUNTER — Ambulatory Visit (INDEPENDENT_AMBULATORY_CARE_PROVIDER_SITE_OTHER): Payer: Medicare Other | Admitting: Orthopaedic Surgery

## 2017-12-20 ENCOUNTER — Ambulatory Visit (INDEPENDENT_AMBULATORY_CARE_PROVIDER_SITE_OTHER): Payer: Medicare Other | Admitting: *Deleted

## 2017-12-20 ENCOUNTER — Inpatient Hospital Stay (INDEPENDENT_AMBULATORY_CARE_PROVIDER_SITE_OTHER): Payer: Medicare Other | Admitting: Orthopaedic Surgery

## 2017-12-20 DIAGNOSIS — I4891 Unspecified atrial fibrillation: Secondary | ICD-10-CM | POA: Diagnosis not present

## 2017-12-20 DIAGNOSIS — Z96651 Presence of right artificial knee joint: Secondary | ICD-10-CM

## 2017-12-20 DIAGNOSIS — Z5181 Encounter for therapeutic drug level monitoring: Secondary | ICD-10-CM | POA: Diagnosis not present

## 2017-12-20 LAB — POCT INR: INR: 2.4

## 2017-12-20 MED ORDER — OXYCODONE HCL 5 MG PO TABS: 5.0000 mg | ORAL_TABLET | ORAL | 0 refills | Status: DC | PRN

## 2017-12-20 NOTE — Progress Notes (Signed)
Mr. Swayne will be 2 weeks tomorrow status post a right total knee arthroplasty.  His hospital course was prolonged by an esophageal stricture.  He is now home with home therapy and he reports that he is doing well.  He is ambulating with a walker.  He would like to go to a cane soon.  He has been for more home therapy visits and then will transition to outpatient physical therapy.  On examination his right knee lacks full extension by only about 3 degrees.  His flexion is to 90 degrees.  His incision is well-healed.  I did place new Steri-Strips.  His calf is soft.  Of note he is on his home Coumadin regimen and his INR is 2.5 per his report.  He is making great progress and I gave him encouragement that he will continue to do well.  He will will transition to outpatient physical therapy.  I did refill his oxycodone.  I would like to see him back in 4 weeks to see how he is doing overall but no x-rays are needed.

## 2017-12-20 NOTE — Patient Instructions (Signed)
[facility]. Pt to continue coumadin 1/2 tablet daily except 1 tablet on Mondays, Wednesdays and Friday.  Recheck INR in 1 week.

## 2017-12-20 NOTE — Telephone Encounter (Signed)
INR 2.4 per Winfield Rast w/ [facility], she can be reached @ [phone]

## 2017-12-20 NOTE — Telephone Encounter (Signed)
Done.  See coumadin note. 

## 2017-12-21 ENCOUNTER — Other Ambulatory Visit (INDEPENDENT_AMBULATORY_CARE_PROVIDER_SITE_OTHER): Payer: Self-pay

## 2017-12-21 DIAGNOSIS — Z96651 Presence of right artificial knee joint: Secondary | ICD-10-CM

## 2017-12-27 ENCOUNTER — Ambulatory Visit (INDEPENDENT_AMBULATORY_CARE_PROVIDER_SITE_OTHER): Payer: Medicare Other | Admitting: *Deleted

## 2017-12-27 DIAGNOSIS — Z5181 Encounter for therapeutic drug level monitoring: Secondary | ICD-10-CM

## 2017-12-27 DIAGNOSIS — I4891 Unspecified atrial fibrillation: Secondary | ICD-10-CM | POA: Diagnosis not present

## 2017-12-27 LAB — POCT INR: INR: 1.7

## 2017-12-27 NOTE — Patient Instructions (Signed)
Spoke with Anderson Malta RN with[facility]. Pt to take coumadin 1 1/2 tablets tonight then resume 1/2 tablet daily except 1 tablet on Mondays, Wednesdays and Friday.  Recheck INR in office in 10 days Pt d/c from [facility] today

## 2017-12-31 ENCOUNTER — Other Ambulatory Visit: Payer: Self-pay

## 2017-12-31 ENCOUNTER — Ambulatory Visit (HOSPITAL_COMMUNITY): Payer: Medicare Other | Attending: Orthopaedic Surgery

## 2017-12-31 ENCOUNTER — Encounter (HOSPITAL_COMMUNITY): Payer: Self-pay

## 2017-12-31 DIAGNOSIS — M25661 Stiffness of right knee, not elsewhere classified: Secondary | ICD-10-CM

## 2017-12-31 DIAGNOSIS — R600 Localized edema: Secondary | ICD-10-CM | POA: Diagnosis not present

## 2017-12-31 DIAGNOSIS — M25561 Pain in right knee: Secondary | ICD-10-CM

## 2017-12-31 DIAGNOSIS — R2689 Other abnormalities of gait and mobility: Secondary | ICD-10-CM

## 2017-12-31 NOTE — Therapy (Signed)
[place], [place], [address] Phone: [phone]   Fax:  [phone]  Physical Therapy Evaluation  Patient Details  Name: Brent Mason MRN: [id number] Date of Birth: August 20, 1946 Referring Provider: Forrestine Him, MD   Encounter Date: 12/31/2017  PT End of Session - 12/31/17 1645    Visit Number  1    Number of Visits  19    Date for PT Re-Evaluation  02/11/18 mini-reassess 01/21/18    Authorization Type  [employer] [facility] Medicare    Authorization Time Period  12/31/17 - 02/11/18    PT Start Time  1353    PT Stop Time  1435    PT Time Calculation (min)  42 min    Equipment Utilized During Treatment  Gait belt    Activity Tolerance  Patient tolerated treatment well;No increased pain    Behavior During Therapy  WFL for tasks assessed/performed       Past Medical History:  Diagnosis Date  . Anemia   . Anticoagulated on Coumadin    long-term  . Chronic kidney disease    frequent uti's last uti few yrs ago  . Diastolic CHF, chronic ([place])   . Dysrhythmia   . GERD (gastroesophageal reflux disease)   . Headache   . Hiatal hernia   . History of adenomatous polyp of colon   . History of esophageal stricture    s/p  dilatations  . History of GI bleed    03/ 2003  per EGD gastritis  . HOH (hard of hearing)    both ears  . Hypertension   . Non-rheumatic aortic stenosis    per last echo 12-14-2015    . OA (osteoarthritis)    oa  . Persistent atrial fibrillation [facility]) first dx  1999   primary cardiologist-  dr taylor-- hx DCCV  . Pneumonia 2009   with cpap usage  . Shortness of breath    when exertion  . Sleep apnea    last sleep study > 5 yrs   no cpap used    Past Surgical History:  Procedure Laterality Date  . ABDOMINAL HERNIA REPAIR  1990s  . ANTERIOR CERVICAL DECOMP/DISCECTOMY FUSION N/A 02/14/2013   Procedure: ANTERIOR CERVICAL DECOMPRESSION/DISCECTOMY FUSION 2 LEVELS;  Surgeon:  Charlie Pitter, MD;  Location: [facility];  Service: Neurosurgery;  Laterality: N/A;  . BALLOON DILATION N/A 12/11/2017   Procedure: BALLOON DILATION;  Surgeon: Clarene Essex, MD;  Location: [facility];  Service: Endoscopy;  Laterality: N/A;  . COLONOSCOPY  last one 06-03-2012  . COLONOSCOPY  06/03/2012   Procedure: COLONOSCOPY;  Surgeon: Daneil Dolin, MD;  Location: [facility];  Service: Endoscopy;  Laterality: N/A;  8:30  . esophageal strecching  yrs ago  . ESOPHAGOGASTRODUODENOSCOPY (EGD) WITH PROPOFOL N/A 12/11/2017   Procedure: ESOPHAGOGASTRODUODENOSCOPY (EGD) WITH PROPOFOL;  Surgeon: Clarene Essex, MD;  Location: [facility];  Service: Endoscopy;  Laterality: N/A;  . ESOPHAGUS SURGERY  20 yrs ago   for food sticking  . FOOT SURGERY Right 04-27-2010   dr duda   joint arthrodesis  . right foot surgery  2018   to remove screw  . TOTAL KNEE ARTHROPLASTY Right 12/07/2017   Procedure: RIGHT TOTAL KNEE ARTHROPLASTY;  Surgeon: Mcarthur Rossetti, MD;  Location: [facility];  Service: Orthopedics;  Laterality: Right;  . TRANSTHORACIC ECHOCARDIOGRAM  12-14-2015  dr taylor   moderate LVH, ef 55-60%, indeterminate diastolic function in setting of  AFib/  moderate LAE and RAE/  mild to moderate calcific aoric stenosis with mild AR (valve area 1.33cm^2, peak grandient28mmHg, mean grandient 65mmHg)/ trivial MR/ mild TR/ PASP 66mmHg  . VOLVULUS REDUCTION  [id number]    There were no vitals filed for this visit.    Subjective Assessment - 12/31/17 1636    Subjective  Patient reports he underwent a right total knee replacement on 12/07/17. He states that while he was at the hospital he had an episode of choking and they performed an esophageal enlargement. He states he has had these done before and denies any difficulty swallowing since this surgery. He reports he had [facility] and was discharged from them last Friday and reports his 2 week follow up with Dr. Ninfa Linden went well and he has another in about 4  weeks. His bandage has already been removed, he was instructed to allow the steri-strips to fall off on their own. He states he has not been doing his exercises regularly and states he is having a lot of trouble sleeping. He is eager to return to his PLOF and be able to go out with his friends to get breakfast in the mornings and is waiting to here when he will be able to drive again. He states he does not want to need therapy for 6 weeks and initially wanted to schedule only 2 or 3 weeks. He states he just wants to get back to his normal self.     Limitations  Sitting;Standing;Walking;House hold activities sleeping    How long can you sit comfortably?  several minutes but it is stiff after prolonged sitting    How long can you stand comfortably?  a couple minutes before it starts gettign more sore    How long can you walk comfortably?  a few minutes    Patient Stated Goals  get back to normal self and be able to drive to breakfast with my friends     Currently in Pain?  Yes    Pain Score  5     Pain Location  Knee    Pain Orientation  Right    Pain Descriptors / Indicators  Aching;Tightness;Sore    Pain Type  Surgical pain    Pain Onset  1 to 4 weeks ago 12/07/17    Pain Frequency  Constant    Aggravating Factors   bending and straightening his knee, walking    Pain Relieving Factors  pain medication, ice, rest    Effect of Pain on Daily Activities  difficulty walking and sleeping        [facility] PT Assessment - 12/31/17 0001      Assessment   Medical Diagnosis  Right TKA    Referring Provider  Forrestine Him, MD    Onset Date/Surgical Date  12/07/17    Next MD Visit  ~ 4 more weeks    Prior Therapy  [facility]PT, Friday last ession      Precautions   Precautions  None      Restrictions   Weight Bearing Restrictions  No      Balance Screen   Has the patient fallen in the past 6 months  No    Has the patient had a decrease in activity level because of a fear of falling?   Yes     Is the patient reluctant to leave their home because of a fear of falling?   No      Home Environment   Living Environment  Private residence    Living Arrangements  Spouse/significant other    Available Help at Discharge  Family    Type of [place] to enter;Ramped entrance    [place] of Steps  5 back    [place] reach both    [place]  One level    [place] - 2 wheels;Cane - single point;Tub bench;Grab bars - tub/shower;Bedside commode;Toilet riser RW at house; walk in shower    Additional Comments  currently independent with self care      Prior Function   Level of Independence  Independent with community mobility with device    Vocation  Retired    Leisure  mowing the yard, Aeronautical engineer, go watch his granddaughter play ball, get breakfast with his friends      Cognition   Overall Cognitive Status  Within Functional Limits for tasks assessed      Observation/Other Assessments   Focus on Therapeutic Outcomes (FOTO)   68% limited      Observation/Other Assessments-Edema    Edema  Circumferential      Circumferential Edema   Circumferential - Right  17.5 inches    Circumferential - Left   15.5 inches      Posture/Postural Control   Posture/Postural Control  Postural limitations    Postural Limitations  Rounded Shoulders;Forward head      ROM / Strength   AROM / PROM / Strength  AROM;Strength      AROM   AROM Assessment Site  Knee    Right/Left Knee  Right    Right Knee Extension  18    Right Knee Flexion  94      Strength   Strength Assessment Site  Hip;Knee;Ankle    Right Hip Flexion  3+/5    Right Hip Extension  3-/5    Right Hip ABduction  3+/5 using hip flexors    Left Hip Flexion  3+/5    Left Hip Extension  3-/5    Left Hip ABduction  3+/5 using hip flexors    Right/Left Knee  Right;Left    Right Knee Flexion  4+/5 minimal pain    Right Knee Extension  4+/5 minimal pain    Left  Knee Flexion  5/5    Left Knee Extension  5/5    Right Ankle Dorsiflexion  4+/5    Left Ankle Dorsiflexion  5/5      Palpation   Patella mobility  hypomobile    Palpation comment  tenderness along superior/anterior Rt knee      Ambulation/Gait   Ambulation/Gait  Yes    Ambulation/Gait Assistance  6: Modified independent (Device/Increase time)    Ambulation Distance (Feet)  60 Feet    Assistive device  Straight cane    Gait Pattern  Step-to pattern;Decreased step length - left;Decreased stance time - right;Decreased stride length;Decreased hip/knee flexion - right;Decreased weight shift to right;Right flexed knee in stance;Antalgic;Wide base of support    Ambulation Surface  Level         Objective measurements completed on examination: See above findings.      [facility] Adult PT Treatment/Exercise - 12/31/17 0001      Exercises   Exercises  Knee/Hip      Knee/Hip Exercises: Supine   Quad Sets  AROM;Right;1 set;10 reps;Limitations    Quad Sets Limitations  5 second holds        PT Education -  12/31/17 1804    Education provided  Yes    Education Details  Educated on findings and discussed voerall process of TKA rehab/recovery. Discussed appropriate timeline and anticipated length of therapy. Educated on initial [place] of ice/elevation.    Person(s) Educated  Patient    Methods  Explanation;Handout    Comprehension  Verbalized understanding;Need further instruction       PT Short Term Goals - 12/31/17 1805      PT SHORT TERM GOAL #1   Title  Patient will be independent with HEP to reduce edema, and improve functional strength to return to PLOF and improve gait/mobility mechanics.     Time  3    Period  Weeks    Status  New    Target Date  01/21/18      PT SHORT TERM GOAL #2   Title  Patient will improve MMT by 1/2 grade for limited muscle groups to improve muscle endurance and gait/mobility performance.    Time  3    Period  Weeks    Status  New       PT SHORT TERM GOAL #3   Title  Patient will improve ROM for right knee extension/flexion by 10 degrees both directions to improve functional gait/mobility to improve stair ambulation to enter home an increase community access.    Time  3    Period  Weeks    Status  New      PT SHORT TERM GOAL #4   Title  Patient will reduce edema by of Rt knee to be no more than 1/2 an inch larger than the Lt knee to improve joint ROM, decrease pain, and improve muscle activation for greater functional strength.    Time  3    Period  Weeks    Status  New        PT Long Term Goals - 12/31/17 1809      PT LONG TERM GOAL #1   Title  Patient will improve MMT by 1 grade for limited muscle groups to improve muscle endurance and gait/mobility performance.    Time  6    Period  Weeks    Status  New    Target Date  02/11/18      PT LONG TERM GOAL #2   Title  Patient will perform 3MWT with LRAD and gait velocity of 1.0 m/s to indicate he is at decreased risk of falling and fall into the community ambulator category to retrn to PLOF with community acitvities with friends.     Time  6    Period  Weeks    Status  New      PT LONG TERM GOAL #3   Title  Patient with improve ROM for Rt knee for 0-110 degrees for more functional gait and improved ability to ascend/descend stairs.    Time  6    Period  Weeks    Status  New         Plan - 12/31/17 1647    Clinical Impression Statement  Brent Mason is a 72 y.o. presenting for PT evaluation following Right TKA on 12/07/17. He currently presents with decreased ROM, pain, weakness, impaired balance, impaired gait/mobiltiy, poor activity tolerance, edema. Currently requires a RW and occasionally a SPC for gait at home and in the community. He has been educated on a typical rehab/recovery time following a TKA and will require ongoing education and encouragement to work with therapy as he  is eager to complete rehab and return to his PLIF. He has been educated on  proper ice/elevation techniques and instructed on initial HEP. His ROM is severely limited into extension, lacking 18 degrees, and flexion is currently 94 degrees. He will benefit from skilled PT to address current impairments and progress towards goals.     Clinical Presentation  Stable    Clinical Presentation due to:  decreased ROM, pain, weakness, impaired balance, impaired gait/mobiltiy, poor activity tolerance, edema, clinical judgement, FOTO    Clinical Decision Making  Low    Rehab Potential  Fair    Clinical Impairments Affecting Rehab Potential  (+) motivation, (+) support system, (-) financial burden, (-) discipline/compliance with HEP    PT Frequency  3x / week    PT Duration  6 weeks    PT Treatment/Interventions  ADLs/Self Care Home Management;Aquatic Therapy;Cryotherapy;Electrical Stimulation;DME Instruction;Gait training;Stair training;Functional mobility training;Therapeutic activities;Therapeutic exercise;Balance training;Neuromuscular re-education;Cognitive remediation;Patient/family education;Manual techniques;Scar mobilization;Passive range of motion;Energy conservation;Taping    PT Next Visit Plan  Review HEP and goals. Initiate manual therapy for edema and measure for compression garment. Initiate ROM exercises and progress HEP (add heel slide, AAROM for knee flexion stretch, SAQ, TKE). Educate/review ice/elevation importance.    PT Home Exercise Plan  Eval: quad set;     Consulted and Agree with Plan of Care  Patient       Patient will benefit from skilled therapeutic intervention in order to improve the following deficits and impairments:  Abnormal gait, Decreased balance, Decreased endurance, Decreased mobility, Decreased skin integrity, Difficulty walking, Hypomobility, Decreased range of motion, Decreased scar mobility, Increased edema, Improper body mechanics, Obesity, Decreased activity tolerance, Decreased knowledge of use of DME, Decreased safety awareness, Decreased  strength, Impaired flexibility, Pain  Visit Diagnosis: Acute pain of right knee  Stiffness of right knee, not elsewhere classified  Localized edema  Other abnormalities of gait and mobility     Problem List Patient Active Problem List   Diagnosis Date Noted  . Status post total right knee replacement 12/07/2017  . Unilateral primary osteoarthritis, right knee 11/05/2017  . Primary osteoarthritis of both knees 05/28/2017  . S/P foot surgery, right 12/05/2016  . Pain in right foot 12/05/2016  . Pain from implanted hardware 12/05/2016  . Bilateral carotid bruits 07/08/2014  . Encounter for therapeutic drug monitoring 11/13/2013  . Claudication of left lower extremity ([place]) 12/02/2012  . Neck pain 09/16/2012  . Cervical spondylosis without myelopathy 09/10/2012  . Hx of adenomatous colonic polyps 05/07/2012  . Rectal bleeding 05/07/2012  . GERD (gastroesophageal reflux disease) 05/07/2012  . Long term current use of anticoagulant 01/25/2011  . HYPERLIPIDEMIA 11/21/2010  . DYSPNEA 10/20/2010  . MORBID OBESITY 09/01/2009  . Atrial fibrillation ([place]) 09/01/2009  . Essential hypertension 06/14/2009  . ESOPHAGEAL STRICTURE 06/14/2009  . DIZZINESS 06/14/2009    Kipp Brood, PT, DPT Physical Therapist with [facility]  12/31/2017 6:21 PM    [place] [address] [place], [place], [address] Phone: [phone]   Fax:  [phone]  Name: Brent Mason MRN: [id number] Date of Birth: 10-21-1945

## 2018-01-02 ENCOUNTER — Ambulatory Visit (HOSPITAL_COMMUNITY): Payer: Medicare Other

## 2018-01-02 ENCOUNTER — Encounter (HOSPITAL_COMMUNITY): Payer: Self-pay

## 2018-01-02 ENCOUNTER — Ambulatory Visit: Payer: Medicare Other | Admitting: Student

## 2018-01-02 DIAGNOSIS — R2689 Other abnormalities of gait and mobility: Secondary | ICD-10-CM | POA: Diagnosis not present

## 2018-01-02 DIAGNOSIS — M25661 Stiffness of right knee, not elsewhere classified: Secondary | ICD-10-CM

## 2018-01-02 DIAGNOSIS — R600 Localized edema: Secondary | ICD-10-CM

## 2018-01-02 DIAGNOSIS — M25561 Pain in right knee: Secondary | ICD-10-CM | POA: Diagnosis not present

## 2018-01-02 NOTE — Therapy (Signed)
[place], [place], [address] Phone: [phone]   Fax:  [phone]  Physical Therapy Treatment  Patient Details  Name: Brent Mason MRN: [id number] Date of Birth: Dec 04, 1945 Referring Provider: Forrestine Him, MD   Encounter Date: 01/02/2018  PT End of Session - 01/02/18 1442    Visit Number  2    Number of Visits  19    Date for PT Re-Evaluation  02/11/18 minireassess 01/21/18    Authorization Type  [employer] [id number] Medicare    Authorization Time Period  12/31/17 - 02/11/18    PT Start Time  1436    PT Stop Time  1514    PT Time Calculation (min)  38 min    Equipment Utilized During Treatment  Gait belt    Activity Tolerance  Patient tolerated treatment well;No increased pain    Behavior During Therapy  WFL for tasks assessed/performed       Past Medical History:  Diagnosis Date  . Anemia   . Anticoagulated on Coumadin    long-term  . Chronic kidney disease    frequent uti's last uti few yrs ago  . Diastolic CHF, chronic ([place])   . Dysrhythmia   . GERD (gastroesophageal reflux disease)   . Headache   . Hiatal hernia   . History of adenomatous polyp of colon   . History of esophageal stricture    s/p  dilatations  . History of GI bleed    03/ 2003  per EGD gastritis  . HOH (hard of hearing)    both ears  . Hypertension   . Non-rheumatic aortic stenosis    per last echo 12-14-2015    . OA (osteoarthritis)    oa  . Persistent atrial fibrillation [facility]) first dx  1999   primary cardiologist-  dr taylor-- hx DCCV  . Pneumonia 2009   with cpap usage  . Shortness of breath    when exertion  . Sleep apnea    last sleep study > 5 yrs   no cpap used    Past Surgical History:  Procedure Laterality Date  . ABDOMINAL HERNIA REPAIR  1990s  . ANTERIOR CERVICAL DECOMP/DISCECTOMY FUSION N/A 02/14/2013   Procedure: ANTERIOR CERVICAL DECOMPRESSION/DISCECTOMY FUSION 2 LEVELS;  Surgeon:  Charlie Pitter, MD;  Location: [place] NEURO ORS;  Service: Neurosurgery;  Laterality: N/A;  . BALLOON DILATION N/A 12/11/2017   Procedure: BALLOON DILATION;  Surgeon: Clarene Essex, MD;  [facility];  Service: Endoscopy;  Laterality: N/A;  . COLONOSCOPY  last one 06-03-2012  . COLONOSCOPY  06/03/2012   Procedure: COLONOSCOPY;  Surgeon: Daneil Dolin, MD;  [facility];  Service: Endoscopy;  Laterality: N/A;  8:30  . esophageal strecching  yrs ago  . ESOPHAGOGASTRODUODENOSCOPY (EGD) WITH PROPOFOL N/A 12/11/2017   Procedure: ESOPHAGOGASTRODUODENOSCOPY (EGD) WITH PROPOFOL;  Surgeon: Clarene Essex, MD;  [facility];  Service: Endoscopy;  Laterality: N/A;  . ESOPHAGUS SURGERY  20 yrs ago   for food sticking  . FOOT SURGERY Right 04-27-2010   dr duda   joint arthrodesis  . right foot surgery  2018   to remove screw  . TOTAL KNEE ARTHROPLASTY Right 12/07/2017   Procedure: RIGHT TOTAL KNEE ARTHROPLASTY;  Surgeon: Mcarthur Rossetti, MD;  [facility];  Service: Orthopedics;  Laterality: Right;  . TRANSTHORACIC ECHOCARDIOGRAM  12-14-2015  dr taylor   moderate LVH, ef 55-60%, indeterminate diastolic function in setting of  AFib/  moderate LAE and RAE/  mild to moderate calcific aoric stenosis with mild AR (valve area 1.33cm^2, peak grandient20mmHg, mean grandient 34mmHg)/ trivial MR/ mild TR/ PASP 45mmHg  . VOLVULUS REDUCTION  [id number]    There were no vitals filed for this visit.  Subjective Assessment - 01/02/18 1440    Subjective  Pt stated he continues to have pain Rt knee scale 4/10 sharp pain wiht weight bearing.  Reports compliance with HEP daily.    Patient Stated Goals  get back to normal self and be able to drive to breakfast with my friends     Currently in Pain?  Yes    Pain Score  4     Pain Location  Knee    Pain Orientation  Right    Pain Descriptors / Indicators  Aching;Tightness;Sore;Sharp    Pain Type  Surgical pain    Pain Onset  1 to 4 weeks ago  12/07/17    Pain Frequency  Constant    Aggravating Factors   bending and straightening his knee, walking    Pain Relieving Factors  pain medication, ice, rest    Effect of Pain on Daily Activities  difficulty walking and sleeping         [facility] PT Assessment - 01/02/18 0001      Assessment   Medical Diagnosis  Right TKA    Referring Provider  Forrestine Him, MD    Onset Date/Surgical Date  12/07/17    Next MD Visit  ~ 4 more weeks    Prior Therapy  HHPT, Friday last ession      Precautions   Precautions  None                  [facility] Adult PT Treatment/Exercise - 01/02/18 0001      Knee/Hip Exercises: Stretches   Passive Hamstring Stretch  3 reps;30 seconds;Limitations    Passive Hamstring Stretch Limitations  supine      Knee/Hip Exercises: Standing   Gait Training  instructed proper hand use with SPC and heel/toe mechanics      Knee/Hip Exercises: Seated   Long Arc Quad  10 reps      Knee/Hip Exercises: Supine   Quad Sets  AROM;Right;1 set;10 reps;Limitations    Quad Sets Limitations  5" holds    Short Arc Quad Sets  15 reps use traction LE support bolster    Heel Slides  10 reps 2 sets    Knee Extension  AROM;Limitations    Knee Extension Limitations  16 was 18    Knee Flexion  AROM;Limitations    Knee Flexion Limitations  105 was 94      Manual Therapy   Manual Therapy  Edema management    Manual therapy comments  Manual complete separate than rest of tx    Edema Management  Retro massage for edema control             PT Education - 01/02/18 1455    Education provided  Yes    Education Details  Reviewed goals, assured compliance with HEP and copy of eval given to pt.  Educated pt on benefits of compression hose, pt stated he has tried them before and too difficult to put on, not interested.  Reviewed RICE application for pain and edema control    Person(s) Educated  Patient    Methods  Explanation;Demonstration;Handout;Verbal cues     Comprehension  Verbalized understanding;Returned demonstration  PT Short Term Goals - 12/31/17 1805      PT SHORT TERM GOAL #1   Title  Patient will be independent with HEP to reduce edema, and improve functional strength to return to PLOF and improve gait/mobility mechanics.     Time  3    Period  Weeks    Status  New    Target Date  01/21/18      PT SHORT TERM GOAL #2   Title  Patient will improve MMT by 1/2 grade for limited muscle groups to improve muscle endurance and gait/mobility performance.    Time  3    Period  Weeks    Status  New      PT SHORT TERM GOAL #3   Title  Patient will improve ROM for right knee extension/flexion by 10 degrees both directions to improve functional gait/mobility to improve stair ambulation to enter home an increase community access.    Time  3    Period  Weeks    Status  New      PT SHORT TERM GOAL #4   Title  Patient will reduce edema by of Rt knee to be no more than 1/2 an inch larger than the Lt knee to improve joint ROM, decrease pain, and improve muscle activation for greater functional strength.    Time  3    Period  Weeks    Status  New        PT Long Term Goals - 12/31/17 1809      PT LONG TERM GOAL #1   Title  Patient will improve MMT by 1 grade for limited muscle groups to improve muscle endurance and gait/mobility performance.    Time  6    Period  Weeks    Status  New    Target Date  02/11/18      PT LONG TERM GOAL #2   Title  Patient will perform 3MWT with LRAD and gait velocity of 1.0 m/s to indicate he is at decreased risk of falling and fall into the community ambulator category to retrn to PLOF with community acitvities with friends.     Time  6    Period  Weeks    Status  New      PT LONG TERM GOAL #3   Title  Patient with improve ROM for Rt knee for 0-110 degrees for more functional gait and improved ability to ascend/descend stairs.    Time  6    Period  Weeks    Status  New            Plan -  01/02/18 1457    Clinical Impression Statement  Reviewed goals, assured compliance with HEP and copy of eval given to pt.  Began session with retro massage to address edema proximal knee.  Pt educated on benefits with compression hose, pt stated he has already tried compression hose and very difficult to put on, not interested in the hose.  Therex focus on ROM, tactile and verbal cueing to improve activation wiht distal quadriceps for extension.  Reviewed compliance wiht HEP and educated pt on benefits with quad sets multiple times a day to improve extension for gait and pain control.  Improved AROM to 16-105 degrees (was 18-94 degrees last session.)  Reports decreased pain at EOS.      Rehab Potential  Fair    Clinical Impairments Affecting Rehab Potential  (+) motivation, (+) support system, (-) financial burden, (-) discipline/compliance with  HEP    PT Frequency  3x / week    PT Duration  6 weeks    PT Treatment/Interventions  ADLs/Self Care Home Management;Aquatic Therapy;Cryotherapy;Electrical Stimulation;DME Instruction;Gait training;Stair training;Functional mobility training;Therapeutic activities;Therapeutic exercise;Balance training;Neuromuscular re-education;Cognitive remediation;Patient/family education;Manual techniques;Scar mobilization;Passive range of motion;Energy conservation;Taping    PT Next Visit Plan  Continued manual therapy for edema.  Therex with ROM exercises, (add heel slides, SAQ and TKE to HEP next session).  Review RICE techniques for pain and edema control.    PT Home Exercise Plan  Eval: quad set;        Patient will benefit from skilled therapeutic intervention in order to improve the following deficits and impairments:  Abnormal gait, Decreased balance, Decreased endurance, Decreased mobility, Decreased skin integrity, Difficulty walking, Hypomobility, Decreased range of motion, Decreased scar mobility, Increased edema, Improper body mechanics, Obesity, Decreased  activity tolerance, Decreased knowledge of use of DME, Decreased safety awareness, Decreased strength, Impaired flexibility, Pain  Visit Diagnosis: Acute pain of right knee  Stiffness of right knee, not elsewhere classified  Localized edema  Other abnormalities of gait and mobility     Problem List Patient Active Problem List   Diagnosis Date Noted  . Status post total right knee replacement 12/07/2017  . Unilateral primary osteoarthritis, right knee 11/05/2017  . Primary osteoarthritis of both knees 05/28/2017  . S/P foot surgery, right 12/05/2016  . Pain in right foot 12/05/2016  . Pain from implanted hardware 12/05/2016  . Bilateral carotid bruits 07/08/2014  . Encounter for therapeutic drug monitoring 11/13/2013  . Claudication of left lower extremity ([place]) 12/02/2012  . Neck pain 09/16/2012  . Cervical spondylosis without myelopathy 09/10/2012  . Hx of adenomatous colonic polyps 05/07/2012  . Rectal bleeding 05/07/2012  . GERD (gastroesophageal reflux disease) 05/07/2012  . Long term current use of anticoagulant 01/25/2011  . HYPERLIPIDEMIA 11/21/2010  . DYSPNEA 10/20/2010  . MORBID OBESITY 09/01/2009  . Atrial fibrillation ([place]) 09/01/2009  . Essential hypertension 06/14/2009  . ESOPHAGEAL STRICTURE 06/14/2009  . DIZZINESS 06/14/2009   Ihor Austin, [place]; [place]  Aldona Lento 01/02/2018, 8:09 PM  [place] [address] [place], [place], [address] Phone: [phone]   Fax:  [phone]  Name: Brent Mason MRN: [id number] Date of Birth: [birth year]

## 2018-01-04 ENCOUNTER — Encounter (HOSPITAL_COMMUNITY): Payer: Self-pay

## 2018-01-04 ENCOUNTER — Ambulatory Visit (HOSPITAL_COMMUNITY): Payer: Medicare Other

## 2018-01-04 DIAGNOSIS — M25561 Pain in right knee: Secondary | ICD-10-CM

## 2018-01-04 DIAGNOSIS — M25661 Stiffness of right knee, not elsewhere classified: Secondary | ICD-10-CM

## 2018-01-04 DIAGNOSIS — R2689 Other abnormalities of gait and mobility: Secondary | ICD-10-CM

## 2018-01-04 DIAGNOSIS — R600 Localized edema: Secondary | ICD-10-CM

## 2018-01-04 NOTE — Therapy (Signed)
[place], [place], [address] Phone: [phone]   Fax:  [phone]  Physical Therapy Treatment  Patient Details  Name: Brent Mason MRN: [id number] Date of Birth: [birth year] Referring Provider: Forrestine Him, MD   Encounter Date: 01/04/2018  PT End of Session - 01/04/18 1356    Visit Number  3    Number of Visits  19    Date for PT Re-Evaluation  02/11/18 minireassess 01/21/2018.  Complete on 01/18/18 prior MD apt    Authorization Type  [employer] [facility] Medicare    Authorization Time Period  12/31/17 - 02/11/18    PT Start Time  1346    PT Stop Time  1429    PT Time Calculation (min)  43 min    Activity Tolerance  Patient tolerated treatment well;No increased pain    Behavior During Therapy  WFL for tasks assessed/performed       Past Medical History:  Diagnosis Date  . Anemia   . Anticoagulated on Coumadin    long-term  . Chronic kidney disease    frequent uti's last uti few yrs ago  . Diastolic CHF, chronic ([place])   . Dysrhythmia   . GERD (gastroesophageal reflux disease)   . Headache   . Hiatal hernia   . History of adenomatous polyp of colon   . History of esophageal stricture    s/p  dilatations  . History of GI bleed    03/ 2003  per EGD gastritis  . HOH (hard of hearing)    both ears  . Hypertension   . Non-rheumatic aortic stenosis    per last echo 12-14-2015    . OA (osteoarthritis)    oa  . Persistent atrial fibrillation [facility]) first dx  1999   primary cardiologist-  dr taylor-- hx DCCV  . Pneumonia 2009   with cpap usage  . Shortness of breath    when exertion  . Sleep apnea    last sleep study > 5 yrs   no cpap used    Past Surgical History:  Procedure Laterality Date  . ABDOMINAL HERNIA REPAIR  1990s  . ANTERIOR CERVICAL DECOMP/DISCECTOMY FUSION N/A 02/14/2013   Procedure: ANTERIOR CERVICAL DECOMPRESSION/DISCECTOMY FUSION 2 LEVELS;  Surgeon: Charlie Pitter, MD;   Location: [place] NEURO ORS;  Service: Neurosurgery;  Laterality: N/A;  . BALLOON DILATION N/A 12/11/2017   Procedure: BALLOON DILATION;  Surgeon: Clarene Essex, MD;  [facility];  Service: Endoscopy;  Laterality: N/A;  . COLONOSCOPY  last one 06-03-2012  . COLONOSCOPY  06/03/2012   Procedure: COLONOSCOPY;  Surgeon: Daneil Dolin, MD;  [facility];  Service: Endoscopy;  Laterality: N/A;  8:30  . esophageal strecching  yrs ago  . ESOPHAGOGASTRODUODENOSCOPY (EGD) WITH PROPOFOL N/A 12/11/2017   Procedure: ESOPHAGOGASTRODUODENOSCOPY (EGD) WITH PROPOFOL;  Surgeon: Clarene Essex, MD;  [facility];  Service: Endoscopy;  Laterality: N/A;  . ESOPHAGUS SURGERY  20 yrs ago   for food sticking  . FOOT SURGERY Right 04-27-2010   dr duda   joint arthrodesis  . right foot surgery  2018   to remove screw  . TOTAL KNEE ARTHROPLASTY Right 12/07/2017   Procedure: RIGHT TOTAL KNEE ARTHROPLASTY;  Surgeon: Mcarthur Rossetti, MD;  [facility];  Service: Orthopedics;  Laterality: Right;  . TRANSTHORACIC ECHOCARDIOGRAM  12-14-2015  dr taylor   moderate LVH, ef 55-60%, indeterminate diastolic function in setting of AFib/  moderate  LAE and RAE/  mild to moderate calcific aoric stenosis with mild AR (valve area 1.33cm^2, peak grandient19mmHg, mean grandient 61mmHg)/ trivial MR/ mild TR/ PASP 4mmHg  . VOLVULUS REDUCTION  [id number]    There were no vitals filed for this visit.  Subjective Assessment - 01/04/18 1350    Subjective  Pt stated knee feels good today.  Reports compliance with HEP and has been icing knee at least 2x/ day    Patient Stated Goals  get back to normal self and be able to drive to breakfast with my friends     Currently in Pain?  No/denies         [facility] PT Assessment - 01/04/18 0001      Assessment   Medical Diagnosis  Right TKA    Referring Provider  Forrestine Him, MD    Onset Date/Surgical Date  12/07/17    Next MD Visit  01/21/18    Prior  Therapy  HHPT, Friday last ession      Precautions   Precautions  None      Circumferential Edema   Circumferential - Right  17 was 17.5 eval            No data recorded       [facility] Adult PT Treatment/Exercise - 01/04/18 0001      Knee/Hip Exercises: Stretches   Passive Hamstring Stretch  3 reps;30 seconds;Limitations    Passive Hamstring Stretch Limitations  supine    Knee: Self-Stretch to increase Flexion  5 reps;10 seconds;Limitations    Knee: Self-Stretch Limitations  knee drive on 67HA step    Gastroc Stretch  3 reps;30 seconds slant baord      Knee/Hip Exercises: Standing   Terminal Knee Extension  Right;10 reps;Theraband    Theraband Level (Terminal Knee Extension)  Level 3 (Green)      Knee/Hip Exercises: Seated   Long Arc Quad  10 reps      Knee/Hip Exercises: Supine   Quad Sets  AROM;Right;1 set;10 reps;Limitations    Quad Sets Limitations  5" holds    Short Arc Quad Sets  15 reps basketball    Heel Slides  10 reps 2 sets    Knee Extension  AROM;Limitations    Knee Extension Limitations  15    Knee Flexion  AROM;Limitations    Knee Flexion Limitations  105      Manual Therapy   Manual Therapy  Edema management;Joint mobilization    Manual therapy comments  Manual complete separate than rest of tx    Edema Management  Retro massage for edema control    Joint Mobilization  patella mobs all directions; move limited superior/inferior than R/L               PT Short Term Goals - 12/31/17 1805      PT SHORT TERM GOAL #1   Title  Patient will be independent with HEP to reduce edema, and improve functional strength to return to PLOF and improve gait/mobility mechanics.     Time  3    Period  Weeks    Status  New    Target Date  01/21/18      PT SHORT TERM GOAL #2   Title  Patient will improve MMT by 1/2 grade for limited muscle groups to improve muscle endurance and gait/mobility performance.    Time  3    Period  Weeks    Status  New       PT  SHORT TERM GOAL #3   Title  Patient will improve ROM for right knee extension/flexion by 10 degrees both directions to improve functional gait/mobility to improve stair ambulation to enter home an increase community access.    Time  3    Period  Weeks    Status  New      PT SHORT TERM GOAL #4   Title  Patient will reduce edema by of Rt knee to be no more than 1/2 an inch larger than the Lt knee to improve joint ROM, decrease pain, and improve muscle activation for greater functional strength.    Time  3    Period  Weeks    Status  New        PT Long Term Goals - 12/31/17 1809      PT LONG TERM GOAL #1   Title  Patient will improve MMT by 1 grade for limited muscle groups to improve muscle endurance and gait/mobility performance.    Time  6    Period  Weeks    Status  New    Target Date  02/11/18      PT LONG TERM GOAL #2   Title  Patient will perform 3MWT with LRAD and gait velocity of 1.0 m/s to indicate he is at decreased risk of falling and fall into the community ambulator category to retrn to PLOF with community acitvities with friends.     Time  6    Period  Weeks    Status  New      PT LONG TERM GOAL #3   Title  Patient with improve ROM for Rt knee for 0-110 degrees for more functional gait and improved ability to ascend/descend stairs.    Time  6    Period  Weeks    Status  New            Plan - 01/04/18 1445    Clinical Impression Statement  Session focus wiht knee mobility.  Pt presents with decreased overall edema, does continues to have swelling proximal knee.  Continues with manual technqieus to address edema and patella mobs to improve mobility.  AROM at 15-105 degrees.  Added standing stretches to improve flexibility and TKE for quad strengthening.  Pt gived advanced HEP including heels slides, SAQ, LAQ, HS stretch.  No reports of pain through session.      Rehab Potential  Fair    Clinical Impairments Affecting Rehab Potential  (+) motivation, (+)  support system, (-) financial burden, (-) discipline/compliance with HEP    PT Frequency  3x / week    PT Duration  6 weeks    PT Treatment/Interventions  ADLs/Self Care Home Management;Aquatic Therapy;Cryotherapy;Electrical Stimulation;DME Instruction;Gait training;Stair training;Functional mobility training;Therapeutic activities;Therapeutic exercise;Balance training;Neuromuscular re-education;Cognitive remediation;Patient/family education;Manual techniques;Scar mobilization;Passive range of motion;Energy conservation;Taping    PT Next Visit Plan  Continue focus with ROM extension > flexion.   Manual for edema PRN.      PT Home Exercise Plan  Eval: quad set; heels slides, SAQ, LAQ, HS stretch       Patient will benefit from skilled therapeutic intervention in order to improve the following deficits and impairments:  Abnormal gait, Decreased balance, Decreased endurance, Decreased mobility, Decreased skin integrity, Difficulty walking, Hypomobility, Decreased range of motion, Decreased scar mobility, Increased edema, Improper body mechanics, Obesity, Decreased activity tolerance, Decreased knowledge of use of DME, Decreased safety awareness, Decreased strength, Impaired flexibility, Pain  Visit Diagnosis: Acute pain of right knee  Stiffness of  right knee, not elsewhere classified  Localized edema  Other abnormalities of gait and mobility     Problem List Patient Active Problem List   Diagnosis Date Noted  . Status post total right knee replacement 12/07/2017  . Unilateral primary osteoarthritis, right knee 11/05/2017  . Primary osteoarthritis of both knees 05/28/2017  . S/P foot surgery, right 12/05/2016  . Pain in right foot 12/05/2016  . Pain from implanted hardware 12/05/2016  . Bilateral carotid bruits 07/08/2014  . Encounter for therapeutic drug monitoring 11/13/2013  . Claudication of left lower extremity ([place]) 12/02/2012  . Neck pain 09/16/2012  . Cervical spondylosis  without myelopathy 09/10/2012  . Hx of adenomatous colonic polyps 05/07/2012  . Rectal bleeding 05/07/2012  . GERD (gastroesophageal reflux disease) 05/07/2012  . Long term current use of anticoagulant 01/25/2011  . HYPERLIPIDEMIA 11/21/2010  . DYSPNEA 10/20/2010  . MORBID OBESITY 09/01/2009  . Atrial fibrillation ([place]) 09/01/2009  . Essential hypertension 06/14/2009  . ESOPHAGEAL STRICTURE 06/14/2009  . DIZZINESS 06/14/2009   Ihor Austin, [place]; [place]  Aldona Lento 01/04/2018, 2:56 PM  [place] [address] [place], [place], [address] Phone: [phone]   Fax:  [phone]  Name: Brent Mason MRN: [id number] Date of Birth: February 09, 1946

## 2018-01-04 NOTE — Patient Instructions (Signed)
Heel Slides    Slide right heel along bed towards bottom. Hold for 5-10 seconds. Slide back to flat knee position. Repeat 10-15 times. Do 2 times a day. Repeat with other leg.  Copyright  VHI. All rights reserved.   Short Arc [employer] a large ball or rolled towel under left leg. Straighten leg. Hold 5 seconds. Repeat 10-15 times. Do 2 sessions per day.  http://gt2.exer.us/298   Copyright  VHI. All rights reserved.   Hamstring Step 3    Left leg in maximal straight leg raise, heel at maximal stretch, straighten knee further by tightening knee cap. Warning: Intense stretch. Stay within tolerance. Hold 30 seconds. Relax knee cap only. Repeat 3 times.  Copyright  VHI. All rights reserved.   Long [employer]    Straighten operated leg and try to hold it 5 seconds.  Repeat 15 times. Do 2 sessions a day.  http://gt2.exer.us/310   Copyright  VHI. All rights reserved.

## 2018-01-07 ENCOUNTER — Ambulatory Visit (HOSPITAL_COMMUNITY): Payer: Medicare Other

## 2018-01-07 ENCOUNTER — Other Ambulatory Visit: Payer: Self-pay

## 2018-01-07 ENCOUNTER — Encounter (HOSPITAL_COMMUNITY): Payer: Self-pay

## 2018-01-07 DIAGNOSIS — R2689 Other abnormalities of gait and mobility: Secondary | ICD-10-CM

## 2018-01-07 DIAGNOSIS — R600 Localized edema: Secondary | ICD-10-CM

## 2018-01-07 DIAGNOSIS — M25661 Stiffness of right knee, not elsewhere classified: Secondary | ICD-10-CM

## 2018-01-07 DIAGNOSIS — M25561 Pain in right knee: Secondary | ICD-10-CM | POA: Diagnosis not present

## 2018-01-07 NOTE — Therapy (Signed)
[place], [place], [address] Phone: [phone]   Fax:  [phone]  Physical Therapy Treatment  Patient Details  Name: GREGOR DERSHEM MRN: [id number] Date of Birth: 02-Dec-1945 Referring Provider: Forrestine Him, MD   Encounter Date: 01/07/2018  PT End of Session - 01/07/18 1407    Visit Number  4    Number of Visits  19    Date for PT Re-Evaluation  02/11/18 minireassess 01/21/2018.  Complete on 01/18/18 prior MD apt    Authorization Type  [employer] [facility] Medicare    Authorization Time Period  12/31/17 - 02/11/18    PT Start Time  1351    PT Stop Time  1430    PT Time Calculation (min)  39 min    Activity Tolerance  Patient tolerated treatment well;No increased pain    Behavior During Therapy  WFL for tasks assessed/performed       Past Medical History:  Diagnosis Date  . Anemia   . Anticoagulated on Coumadin    long-term  . Chronic kidney disease    frequent uti's last uti few yrs ago  . Diastolic CHF, chronic ([place])   . Dysrhythmia   . GERD (gastroesophageal reflux disease)   . Headache   . Hiatal hernia   . History of adenomatous polyp of colon   . History of esophageal stricture    s/p  dilatations  . History of GI bleed    03/ 2003  per EGD gastritis  . HOH (hard of hearing)    both ears  . Hypertension   . Non-rheumatic aortic stenosis    per last echo 12-14-2015    . OA (osteoarthritis)    oa  . Persistent atrial fibrillation [facility]) first dx  1999   primary cardiologist-  dr taylor-- hx DCCV  . Pneumonia 2009   with cpap usage  . Shortness of breath    when exertion  . Sleep apnea    last sleep study > 5 yrs   no cpap used    Past Surgical History:  Procedure Laterality Date  . ABDOMINAL HERNIA REPAIR  1990s  . ANTERIOR CERVICAL DECOMP/DISCECTOMY FUSION N/A 02/14/2013   Procedure: ANTERIOR CERVICAL DECOMPRESSION/DISCECTOMY FUSION 2 LEVELS;  Surgeon: Charlie Pitter, MD;   Location: [facility];  Service: Neurosurgery;  Laterality: N/A;  . BALLOON DILATION N/A 12/11/2017   Procedure: BALLOON DILATION;  Surgeon: Clarene Essex, MD;  [facility];  Service: Endoscopy;  Laterality: N/A;  . COLONOSCOPY  last one 06-03-2012  . COLONOSCOPY  06/03/2012   Procedure: COLONOSCOPY;  Surgeon: Daneil Dolin, MD;  [facility];  Service: Endoscopy;  Laterality: N/A;  8:30  . esophageal strecching  yrs ago  . ESOPHAGOGASTRODUODENOSCOPY (EGD) WITH PROPOFOL N/A 12/11/2017   Procedure: ESOPHAGOGASTRODUODENOSCOPY (EGD) WITH PROPOFOL;  Surgeon: Clarene Essex, MD;  [facility];  Service: Endoscopy;  Laterality: N/A;  . ESOPHAGUS SURGERY  20 yrs ago   for food sticking  . FOOT SURGERY Right 04-27-2010   dr duda   joint arthrodesis  . right foot surgery  2018   to remove screw  . TOTAL KNEE ARTHROPLASTY Right 12/07/2017   Procedure: RIGHT TOTAL KNEE ARTHROPLASTY;  Surgeon: Mcarthur Rossetti, MD;  [facility];  Service: Orthopedics;  Laterality: Right;  . TRANSTHORACIC ECHOCARDIOGRAM  12-14-2015  dr taylor   moderate LVH, ef 55-60%, indeterminate diastolic function in setting of AFib/  moderate  LAE and RAE/  mild to moderate calcific aoric stenosis with mild AR (valve area 1.33cm^2, peak grandient67mmHg, mean grandient 49mmHg)/ trivial MR/ mild TR/ PASP 12mmHg  . VOLVULUS REDUCTION  [id number]    There were no vitals filed for this visit.  Subjective Assessment - 01/07/18 1413    Subjective  Patient reports he walked a lot earlier today when he went for haircut and that he did no have any increase in pain. He states he is participating in his HEp and continues to ice/elevated his leg.    Limitations  Sitting;Standing;Walking;House hold activities    Patient Stated Goals  get back to normal self and be able to drive to breakfast with my friends     Currently in Pain?  Yes    Pain Score  3     Pain Location  Knee    Pain Orientation  Right     Pain Descriptors / Indicators  Aching;Sore    Pain Type  Surgical pain    Pain Onset  More than a month ago    Pain Frequency  Constant    Aggravating Factors   walking, bending his knee    Pain Relieving Factors  pain medication, ice    Effect of Pain on Daily Activities  difficult to walk        No data recorded   [facility] Adult PT Treatment/Exercise - 01/07/18 0001      Knee/Hip Exercises: Stretches   Active Hamstring Stretch  3 reps;30 seconds;Limitations    Active Hamstring Stretch Limitations  12" box with AP pressure on femur for extension mobilization    Knee: Self-Stretch to increase Flexion  3 reps;30 seconds    Knee: Self-Stretch Limitations  knee drive on 16XW step    Gastroc Stretch  3 reps;30 seconds      Knee/Hip Exercises: Aerobic   Stationary Bike  4 mins full revolution on seat 19, no resistance      Knee/Hip Exercises: Standing   Terminal Knee Extension  Right;Theraband;2 sets;10 reps    Theraband Level (Terminal Knee Extension)  Level 3 (Green)      Knee/Hip Exercises: Supine   Quad Sets  AROM;Right;10 reps;Limitations;2 sets    Quad Sets Limitations  5" holds      Manual Therapy   Manual Therapy  Joint mobilization    Manual therapy comments  Manual complete separate than rest of tx    Joint Mobilization  3x 30-45 seconds all mobilization: AP/PA glide to tibfem joint for flexion.extension, grade III; patella mobs all directions; move limited superior/inferior than R/L        PT Education - 01/07/18 1441    Education provided  Yes    Education Details  Educated on exercise throughout and on moisturizing for scar healing health.    Person(s) Educated  Patient    Methods  Explanation    Comprehension  Verbalized understanding       PT Short Term Goals - 12/31/17 1805      PT SHORT TERM GOAL #1   Title  Patient will be independent with HEP to reduce edema, and improve functional strength to return to PLOF and improve gait/mobility mechanics.      Time  3    Period  Weeks    Status  New    Target Date  01/21/18      PT SHORT TERM GOAL #2   Title  Patient will improve MMT by 1/2 grade for limited muscle groups to  improve muscle endurance and gait/mobility performance.    Time  3    Period  Weeks    Status  New      PT SHORT TERM GOAL #3   Title  Patient will improve ROM for right knee extension/flexion by 10 degrees both directions to improve functional gait/mobility to improve stair ambulation to enter home an increase community access.    Time  3    Period  Weeks    Status  New      PT SHORT TERM GOAL #4   Title  Patient will reduce edema by of Rt knee to be no more than 1/2 an inch larger than the Lt knee to improve joint ROM, decrease pain, and improve muscle activation for greater functional strength.    Time  3    Period  Weeks    Status  New        PT Long Term Goals - 12/31/17 1809      PT LONG TERM GOAL #1   Title  Patient will improve MMT by 1 grade for limited muscle groups to improve muscle endurance and gait/mobility performance.    Time  6    Period  Weeks    Status  New    Target Date  02/11/18      PT LONG TERM GOAL #2   Title  Patient will perform 3MWT with LRAD and gait velocity of 1.0 m/s to indicate he is at decreased risk of falling and fall into the community ambulator category to retrn to PLOF with community acitvities with friends.     Time  6    Period  Weeks    Status  New      PT LONG TERM GOAL #3   Title  Patient with improve ROM for Rt knee for 0-110 degrees for more functional gait and improved ability to ascend/descend stairs.    Time  6    Period  Weeks    Status  New        Plan - 01/07/18 1435    Clinical Impression Statement  Patient is making gradual progress in therapy but remains limited with ROM greatly. Patient initiated bike for AROM today and achieve full revolutions with Rt knee. Today's session focused on Rt knee mobility with stretches, and sustained overpressure  for ROM during mobility. Manual therapy continued for joint mobility and patient educated on scar healing health and importance of moisturizing for skin health. He was instructed on importance of compliance with HEP with an emphasis on knee extension exercises. He will continue to benefit from skilled PT services to address current impairments and progress toward goals.    Rehab Potential  Fair    Clinical Impairments Affecting Rehab Potential  (+) motivation, (+) support system, (-) financial burden, (-) discipline/compliance with HEP    PT Frequency  3x / week    PT Duration  6 weeks    PT Treatment/Interventions  ADLs/Self Care Home Management;Aquatic Therapy;Cryotherapy;Electrical Stimulation;DME Instruction;Gait training;Stair training;Functional mobility training;Therapeutic activities;Therapeutic exercise;Balance training;Neuromuscular re-education;Cognitive remediation;Patient/family education;Manual techniques;Scar mobilization;Passive range of motion;Energy conservation;Taping    PT Next Visit Plan  Continue focus with ROM extension > flexion.  Continue with manual for edema and education on scar massage. Continue with bike for ROM. If edema continues patient may benefit from compression garment.    PT Home Exercise Plan  Eval: quad set; heels slides, SAQ, LAQ, HS stretch    Consulted and Agree with Plan of Care  Patient       Patient will benefit from skilled therapeutic intervention in order to improve the following deficits and impairments:  Abnormal gait, Decreased balance, Decreased endurance, Decreased mobility, Decreased skin integrity, Difficulty walking, Hypomobility, Decreased range of motion, Decreased scar mobility, Increased edema, Improper body mechanics, Obesity, Decreased activity tolerance, Decreased knowledge of use of DME, Decreased safety awareness, Decreased strength, Impaired flexibility, Pain  Visit Diagnosis: Acute pain of right knee  Stiffness of right knee, not  elsewhere classified  Localized edema  Other abnormalities of gait and mobility     Problem List Patient Active Problem List   Diagnosis Date Noted  . Status post total right knee replacement 12/07/2017  . Unilateral primary osteoarthritis, right knee 11/05/2017  . Primary osteoarthritis of both knees 05/28/2017  . S/P foot surgery, right 12/05/2016  . Pain in right foot 12/05/2016  . Pain from implanted hardware 12/05/2016  . Bilateral carotid bruits 07/08/2014  . Encounter for therapeutic drug monitoring 11/13/2013  . Claudication of left lower extremity ([place]) 12/02/2012  . Neck pain 09/16/2012  . Cervical spondylosis without myelopathy 09/10/2012  . Hx of adenomatous colonic polyps 05/07/2012  . Rectal bleeding 05/07/2012  . GERD (gastroesophageal reflux disease) 05/07/2012  . Long term current use of anticoagulant 01/25/2011  . HYPERLIPIDEMIA 11/21/2010  . DYSPNEA 10/20/2010  . MORBID OBESITY 09/01/2009  . Atrial fibrillation ([place]) 09/01/2009  . Essential hypertension 06/14/2009  . ESOPHAGEAL STRICTURE 06/14/2009  . DIZZINESS 06/14/2009    Kipp Brood, PT, DPT Physical Therapist with [facility]  01/07/2018 2:43 PM    [place] [address] [place], [place], [address] Phone: [phone]   Fax:  [phone]  Name: RENLEY BANWART MRN: [id number] Date of Birth: February 12, 1946

## 2018-01-09 ENCOUNTER — Ambulatory Visit (HOSPITAL_COMMUNITY): Payer: Medicare Other

## 2018-01-09 ENCOUNTER — Encounter (HOSPITAL_COMMUNITY): Payer: Self-pay

## 2018-01-09 DIAGNOSIS — M25661 Stiffness of right knee, not elsewhere classified: Secondary | ICD-10-CM

## 2018-01-09 DIAGNOSIS — R2689 Other abnormalities of gait and mobility: Secondary | ICD-10-CM

## 2018-01-09 DIAGNOSIS — M25561 Pain in right knee: Secondary | ICD-10-CM | POA: Diagnosis not present

## 2018-01-09 DIAGNOSIS — R600 Localized edema: Secondary | ICD-10-CM

## 2018-01-09 NOTE — Therapy (Signed)
[place], [place], [address] Phone: [phone]   Fax:  [phone]  Physical Therapy Treatment  Patient Details  Name: Brent Mason MRN: [id number] Date of Birth: 1946-08-10 Referring Provider: Rushie Chestnut, MD   Encounter Date: 01/09/2018  PT End of Session - 01/09/18 1437    Visit Number  5    Number of Visits  19    Date for PT Re-Evaluation  02/11/18 minireassess 01/21/18.  Complete on 4/5 prior MD apt    Authorization Type  [employer] [place] Medicare    Authorization Time Period  12/31/17 - 02/11/18    PT Start Time  1434    PT Stop Time  1512    PT Time Calculation (min)  38 min    Activity Tolerance  Patient tolerated treatment well;No increased pain    Behavior During Therapy  WFL for tasks assessed/performed       Past Medical History:  Diagnosis Date  . Anemia   . Anticoagulated on Coumadin    long-term  . Chronic kidney disease    frequent uti's last uti few yrs ago  . Diastolic CHF, chronic ([place])   . Dysrhythmia   . GERD (gastroesophageal reflux disease)   . Headache   . Hiatal hernia   . History of adenomatous polyp of colon   . History of esophageal stricture    s/p  dilatations  . History of GI bleed    03/ 2003  per EGD gastritis  . HOH (hard of hearing)    both ears  . Hypertension   . Non-rheumatic aortic stenosis    per last echo 12-14-2015    . OA (osteoarthritis)    oa  . Persistent atrial fibrillation [facility]) first dx  1999   primary cardiologist-  dr taylor-- hx DCCV  . Pneumonia 2009   with cpap usage  . Shortness of breath    when exertion  . Sleep apnea    last sleep study > 5 yrs   no cpap used    Past Surgical History:  Procedure Laterality Date  . ABDOMINAL HERNIA REPAIR  1990s  . ANTERIOR CERVICAL DECOMP/DISCECTOMY FUSION N/A 02/14/2013   Procedure: ANTERIOR CERVICAL DECOMPRESSION/DISCECTOMY FUSION 2 LEVELS;  Surgeon: Charlie Pitter, MD;   Location: [facility];  Service: Neurosurgery;  Laterality: N/A;  . BALLOON DILATION N/A 12/11/2017   Procedure: BALLOON DILATION;  Surgeon: Clarene Essex, MD;  [facility];  Service: Endoscopy;  Laterality: N/A;  . COLONOSCOPY  last one 06-03-2012  . COLONOSCOPY  06/03/2012   Procedure: COLONOSCOPY;  Surgeon: Daneil Dolin, MD;  [facility];  Service: Endoscopy;  Laterality: N/A;  8:30  . esophageal strecching  yrs ago  . ESOPHAGOGASTRODUODENOSCOPY (EGD) WITH PROPOFOL N/A 12/11/2017   Procedure: ESOPHAGOGASTRODUODENOSCOPY (EGD) WITH PROPOFOL;  Surgeon: Clarene Essex, MD;  [facility];  Service: Endoscopy;  Laterality: N/A;  . ESOPHAGUS SURGERY  20 yrs ago   for food sticking  . FOOT SURGERY Right 04-27-2010   dr duda   joint arthrodesis  . right foot surgery  2018   to remove screw  . TOTAL KNEE ARTHROPLASTY Right 12/07/2017   Procedure: RIGHT TOTAL KNEE ARTHROPLASTY;  Surgeon: Mcarthur Rossetti, MD;  [facility];  Service: Orthopedics;  Laterality: Right;  . TRANSTHORACIC ECHOCARDIOGRAM  12-14-2015  dr taylor   moderate LVH, ef 55-60%, indeterminate diastolic function in setting of AFib/  moderate LAE  and RAE/  mild to moderate calcific aoric stenosis with mild AR (valve area 1.33cm^2, peak grandient25mmHg, mean grandient 77mmHg)/ trivial MR/ mild TR/ PASP 82mmHg  . VOLVULUS REDUCTION  [id number]    There were no vitals filed for this visit.  Subjective Assessment - 01/09/18 1436    Subjective  Pt stated knee is feeling good today, pain scale 3/10 soreness.    Patient Stated Goals  get back to normal self and be able to drive to breakfast with my friends     Currently in Pain?  Yes    Pain Score  3     Pain Location  Knee    Pain Orientation  Right    Pain Descriptors / Indicators  Sore    Pain Type  Surgical pain    Pain Onset  More than a month ago    Pain Frequency  Constant    Aggravating Factors   walking, bending knee    Pain Relieving  Factors  pain medication, ice    Effect of Pain on Daily Activities  difficult to walk         [facility] PT Assessment - 01/09/18 0001      Assessment   Medical Diagnosis  Right TKA    Referring Provider  Rushie Chestnut, MD    Onset Date/Surgical Date  12/07/17    Next MD Visit  01/21/18    Prior Therapy  [facility]PT, Friday last ession      Precautions   Precautions  None            No data recorded       [facility] Adult PT Treatment/Exercise - 01/09/18 0001      Knee/Hip Exercises: Stretches   Active Hamstring Stretch  3 reps;30 seconds;Limitations    Active Hamstring Stretch Limitations  12" box with AP pressure on femur for extension mobilization    Knee: Self-Stretch to increase Flexion  5 reps;10 seconds    Knee: Self-Stretch Limitations  knee drive on 52WU step    Gastroc Stretch  3 reps;30 seconds slant board      Knee/Hip Exercises: Aerobic   Stationary Bike  4 mins full revolution on seat 19, no resistance      Knee/Hip Exercises: Standing   Heel Raises  10 reps Toe raises    Terminal Knee Extension  Right;Theraband;2 sets;10 reps    Theraband Level (Terminal Knee Extension)  Level 3 (Green)      Knee/Hip Exercises: Supine   Quad Sets  AROM;Right;10 reps;Limitations;2 sets    [employer] Limitations  5" holds    Short Arc Quad Sets  15 reps traction LE support bolster    Heel Slides  10 reps    Knee Extension  AROM    Knee Extension Limitations  15    Knee Flexion  AROM    Knee Flexion Limitations  110      Manual Therapy   Manual Therapy  Joint mobilization    Manual therapy comments  Manual complete separate than rest of tx    Joint Mobilization  3x 30-45 seconds all mobilization: AP/PA glide to tibfem joint for flexion.extension, grade III; patella mobs all directions; move limited superior/inferior than R/L               PT Short Term Goals - 12/31/17 1805      PT SHORT TERM GOAL #1   Title  Patient will be independent with HEP to reduce  edema, and improve functional  strength to return to PLOF and improve gait/mobility mechanics.     Time  3    Period  Weeks    Status  New    Target Date  01/21/18      PT SHORT TERM GOAL #2   Title  Patient will improve MMT by 1/2 grade for limited muscle groups to improve muscle endurance and gait/mobility performance.    Time  3    Period  Weeks    Status  New      PT SHORT TERM GOAL #3   Title  Patient will improve ROM for right knee extension/flexion by 10 degrees both directions to improve functional gait/mobility to improve stair ambulation to enter home an increase community access.    Time  3    Period  Weeks    Status  New      PT SHORT TERM GOAL #4   Title  Patient will reduce edema by of Rt knee to be no more than 1/2 an inch larger than the Lt knee to improve joint ROM, decrease pain, and improve muscle activation for greater functional strength.    Time  3    Period  Weeks    Status  New        PT Long Term Goals - 12/31/17 1809      PT LONG TERM GOAL #1   Title  Patient will improve MMT by 1 grade for limited muscle groups to improve muscle endurance and gait/mobility performance.    Time  6    Period  Weeks    Status  New    Target Date  02/11/18      PT LONG TERM GOAL #2   Title  Patient will perform 3MWT with LRAD and gait velocity of 1.0 m/s to indicate he is at decreased risk of falling and fall into the community ambulator category to retrn to PLOF with community acitvities with friends.     Time  6    Period  Weeks    Status  New      PT LONG TERM GOAL #3   Title  Patient with improve ROM for Rt knee for 0-110 degrees for more functional gait and improved ability to ascend/descend stairs.    Time  6    Period  Weeks    Status  New            Plan - 01/09/18 1456    Clinical Impression Statement  Continued session focus with knee mobility.  Pt reports increased ease with recumbent bike with increase ease seat 19 at beginning of session.  Pt  continues to require cueing to improve quadricep contraction and reduce gluteal activation, tendency to ER hip for comfort.  Quad fatigues quickly requiring cueing to improve mechanics with increase reps.  Good patella mobility noted, did report some pain with medial/lateral movements.  AROM at EOS 15-110 degrees.  Reviewed HEP compliance and encouraged pt to begin proping LE on coffee table to assist with extension.      Rehab Potential  Fair    Clinical Impairments Affecting Rehab Potential  (+) motivation, (+) support system, (-) financial burden, (-) discipline/compliance with HEP    PT Frequency  3x / week    PT Duration  6 weeks    PT Treatment/Interventions  ADLs/Self Care Home Management;Aquatic Therapy;Cryotherapy;Electrical Stimulation;DME Instruction;Gait training;Stair training;Functional mobility training;Therapeutic activities;Therapeutic exercise;Balance training;Neuromuscular re-education;Cognitive remediation;Patient/family education;Manual techniques;Scar mobilization;Passive range of motion;Energy conservation;Taping    PT Next  Visit Plan  Continue focus with ROM extension > flexion.  Continue with manual for edema and education on scar massage. Continue with bike for ROM. If edema continues patient may benefit from compression garment.    PT Home Exercise Plan  Eval: quad set; heels slides, SAQ, LAQ, HS stretch       Patient will benefit from skilled therapeutic intervention in order to improve the following deficits and impairments:  Abnormal gait, Decreased balance, Decreased endurance, Decreased mobility, Decreased skin integrity, Difficulty walking, Hypomobility, Decreased range of motion, Decreased scar mobility, Increased edema, Improper body mechanics, Obesity, Decreased activity tolerance, Decreased knowledge of use of DME, Decreased safety awareness, Decreased strength, Impaired flexibility, Pain  Visit Diagnosis: Acute pain of right knee  Stiffness of right knee, not  elsewhere classified  Localized edema  Other abnormalities of gait and mobility     Problem List Patient Active Problem List   Diagnosis Date Noted  . Status post total right knee replacement 12/07/2017  . Unilateral primary osteoarthritis, right knee 11/05/2017  . Primary osteoarthritis of both knees 05/28/2017  . S/P foot surgery, right 12/05/2016  . Pain in right foot 12/05/2016  . Pain from implanted hardware 12/05/2016  . Bilateral carotid bruits 07/08/2014  . Encounter for therapeutic drug monitoring 11/13/2013  . Claudication of left lower extremity ([place]) 12/02/2012  . Neck pain 09/16/2012  . Cervical spondylosis without myelopathy 09/10/2012  . Hx of adenomatous colonic polyps 05/07/2012  . Rectal bleeding 05/07/2012  . GERD (gastroesophageal reflux disease) 05/07/2012  . Long term current use of anticoagulant 01/25/2011  . HYPERLIPIDEMIA 11/21/2010  . DYSPNEA 10/20/2010  . MORBID OBESITY 09/01/2009  . Atrial fibrillation ([place]) 09/01/2009  . Essential hypertension 06/14/2009  . ESOPHAGEAL STRICTURE 06/14/2009  . DIZZINESS 06/14/2009   Ihor Austin, [place]; [place]  Aldona Lento 01/09/2018, 3:42 PM  [place] [address] [place], [place], [address] Phone: [phone]   Fax:  [phone]  Name: Brent Mason MRN: [id number] Date of Birth: 12-21-1945

## 2018-01-11 ENCOUNTER — Ambulatory Visit (HOSPITAL_COMMUNITY): Payer: Medicare Other | Admitting: Physical Therapy

## 2018-01-11 ENCOUNTER — Encounter (HOSPITAL_COMMUNITY): Payer: Self-pay | Admitting: Physical Therapy

## 2018-01-11 DIAGNOSIS — M25661 Stiffness of right knee, not elsewhere classified: Secondary | ICD-10-CM | POA: Diagnosis not present

## 2018-01-11 DIAGNOSIS — R600 Localized edema: Secondary | ICD-10-CM | POA: Diagnosis not present

## 2018-01-11 DIAGNOSIS — M25561 Pain in right knee: Secondary | ICD-10-CM | POA: Diagnosis not present

## 2018-01-11 DIAGNOSIS — R2689 Other abnormalities of gait and mobility: Secondary | ICD-10-CM

## 2018-01-11 NOTE — Therapy (Signed)
[place], [place], [address] Phone: [phone]   Fax:  [phone]  Physical Therapy Treatment  Patient Details  Name: Brent Mason MRN: [id number] Date of Birth: 1946/08/24 Referring Provider: Rushie Chestnut, MD   Encounter Date: 01/11/2018  PT End of Session - 01/11/18 1437    Visit Number  6    Number of Visits  19    Date for PT Re-Evaluation  02/11/18 minireassess 01/21/18.  Complete on 4/5 prior MD apt    Authorization Type  [employer] [facility] Medicare    Authorization Time Period  12/31/17 - 02/11/18    PT Start Time  1350    PT Stop Time  1430    PT Time Calculation (min)  40 min    Activity Tolerance  Patient tolerated treatment well;No increased pain    Behavior During Therapy  WFL for tasks assessed/performed       Past Medical History:  Diagnosis Date  . Anemia   . Anticoagulated on Coumadin    long-term  . Chronic kidney disease    frequent uti's last uti few yrs ago  . Diastolic CHF, chronic ([place])   . Dysrhythmia   . GERD (gastroesophageal reflux disease)   . Headache   . Hiatal hernia   . History of adenomatous polyp of colon   . History of esophageal stricture    s/p  dilatations  . History of GI bleed    03/ 2003  per EGD gastritis  . HOH (hard of hearing)    both ears  . Hypertension   . Non-rheumatic aortic stenosis    per last echo 12-14-2015    . OA (osteoarthritis)    oa  . Persistent atrial fibrillation [facility]) first dx  1999   primary cardiologist-  dr taylor-- hx DCCV  . Pneumonia 2009   with cpap usage  . Shortness of breath    when exertion  . Sleep apnea    last sleep study > 5 yrs   no cpap used    Past Surgical History:  Procedure Laterality Date  . ABDOMINAL HERNIA REPAIR  1990s  . ANTERIOR CERVICAL DECOMP/DISCECTOMY FUSION N/A 02/14/2013   Procedure: ANTERIOR CERVICAL DECOMPRESSION/DISCECTOMY FUSION 2 LEVELS;  Surgeon: Charlie Pitter, MD;   Location: [place] [facility] OR[facility];  Service: Neurosurgery;  Laterality: N/A;  . BALLOON DILATION N/A 12/11/2017   Procedure: BALLOON DILATION;  Surgeon: Clarene Essex, MD;  [facility];  Service: Endoscopy;  Laterality: N/A;  . COLONOSCOPY  last one 06-03-2012  . COLONOSCOPY  06/03/2012   Procedure: COLONOSCOPY;  Surgeon: Daneil Dolin, MD;  [facility];  Service: Endoscopy;  Laterality: N/A;  8:30  . esophageal strecching  yrs ago  . ESOPHAGOGASTRODUODENOSCOPY (EGD) WITH PROPOFOL N/A 12/11/2017   Procedure: ESOPHAGOGASTRODUODENOSCOPY (EGD) WITH PROPOFOL;  Surgeon: Clarene Essex, MD;  [facility];  Service: Endoscopy;  Laterality: N/A;  . ESOPHAGUS SURGERY  20 yrs ago   for food sticking  . FOOT SURGERY Right 04-27-2010   dr duda   joint arthrodesis  . right foot surgery  2018   to remove screw  . TOTAL KNEE ARTHROPLASTY Right 12/07/2017   Procedure: RIGHT TOTAL KNEE ARTHROPLASTY;  Surgeon: Mcarthur Rossetti, MD;  [facility];  Service: Orthopedics;  Laterality: Right;  . TRANSTHORACIC ECHOCARDIOGRAM  12-14-2015  dr taylor   moderate LVH, ef 55-60%, indeterminate diastolic function in setting of AFib/  moderate LAE  and RAE/  mild to moderate calcific aoric stenosis with mild AR (valve area 1.33cm^2, peak grandient51mmHg, mean grandient 77mmHg)/ trivial MR/ mild TR/ PASP 31mmHg  . VOLVULUS REDUCTION  [id number]    There were no vitals filed for this visit.  Subjective Assessment - 01/11/18 1352    Subjective  Patient stated he is having 3/10 pain today and he reported he has been doing his exercises.     Patient Stated Goals  get back to normal self and be able to drive to breakfast with my friends     Currently in Pain?  Yes    Pain Score  3     Pain Location  Knee    Pain Orientation  Right    Pain Descriptors / Indicators  Sore    Pain Type  Surgical pain    Pain Onset  More than a month ago                No data recorded       [facility]  Adult PT Treatment/Exercise - 01/11/18 0001      Knee/Hip Exercises: Stretches   Passive Hamstring Stretch  3 reps;30 seconds;Limitations    Passive Hamstring Stretch Limitations  on 12 inch step    Knee: Self-Stretch to increase Flexion  5 reps;10 seconds    Knee: Self-Stretch Limitations  knee drive on 61WE step    Gastroc Stretch  3 reps;30 seconds Slant board      Knee/Hip Exercises: Aerobic   Stationary Bike  4 mins full revolution on seat 19, no resistance      Knee/Hip Exercises: Standing   Heel Raises  10 reps 10 reps heel raises 10 reps toe raises. Bilat UE support    Knee Flexion  AROM;Strengthening;1 set;10 reps;Right;Other (comment) Hamstring curl with bilateral upper extremity support    Terminal Knee Extension  Right;Theraband;2 sets;10 reps 10 second holds    Theraband Level (Terminal Knee Extension)  Level 3 (Green)      Knee/Hip Exercises: Supine   Quad Sets  AROM;Right;10 reps;Limitations;2 sets    [employer] Limitations  5'' holds    Short Arc [employer]  15 reps;Right;1 set;Limitations    Short Arc [employer] Limitations  Over foam roll    Heel Slides  10 reps;Right;AROM    Knee Extension  AROM    Knee Extension Limitations  15    Knee Flexion  AROM    Knee Flexion Limitations  111      Manual Therapy   Manual Therapy  Joint mobilization    Manual therapy comments  All manual therapy was completed separately from all other skilled interventions    Joint Mobilization  Patient supine. 3x 30-45 seconds all mobilization: AP/PA glide to tibiofemoral joint for flexion.extension, grade III; patella mobs all directions; move limited superior/inferior than R/L right lower extremity             PT Education - 01/11/18 1435    Education provided  Yes    Education Details  Patient educated on purpose and technique of exercises throughout session.     Person(s) Educated  Patient    Methods  Explanation;Demonstration;Tactile cues;Verbal cues    Comprehension   Verbalized understanding;Returned demonstration;Verbal cues required;Tactile cues required       PT Short Term Goals - 12/31/17 1805      PT SHORT TERM GOAL #1   Title  Patient will be independent with HEP to reduce edema, and improve functional strength  to return to PLOF and improve gait/mobility mechanics.     Time  3    Period  Weeks    Status  New    Target Date  01/21/18      PT SHORT TERM GOAL #2   Title  Patient will improve MMT by 1/2 grade for limited muscle groups to improve muscle endurance and gait/mobility performance.    Time  3    Period  Weeks    Status  New      PT SHORT TERM GOAL #3   Title  Patient will improve ROM for right knee extension/flexion by 10 degrees both directions to improve functional gait/mobility to improve stair ambulation to enter home an increase community access.    Time  3    Period  Weeks    Status  New      PT SHORT TERM GOAL #4   Title  Patient will reduce edema by of Rt knee to be no more than 1/2 an inch larger than the Lt knee to improve joint ROM, decrease pain, and improve muscle activation for greater functional strength.    Time  3    Period  Weeks    Status  New        PT Long Term Goals - 12/31/17 1809      PT LONG TERM GOAL #1   Title  Patient will improve MMT by 1 grade for limited muscle groups to improve muscle endurance and gait/mobility performance.    Time  6    Period  Weeks    Status  New    Target Date  02/11/18      PT LONG TERM GOAL #2   Title  Patient will perform 3MWT with LRAD and gait velocity of 1.0 m/s to indicate he is at decreased risk of falling and fall into the community ambulator category to retrn to PLOF with community acitvities with friends.     Time  6    Period  Weeks    Status  New      PT LONG TERM GOAL #3   Title  Patient with improve ROM for Rt knee for 0-110 degrees for more functional gait and improved ability to ascend/descend stairs.    Time  6    Period  Weeks    Status  New             Plan - 01/11/18 1438    Clinical Impression Statement  This session initially began with patient performing supine exercises with a focus on improving range of motion. Patient demonstrated 1 degree of improvement in right knee flexion active range of motion this session. Then session progressed to patient performing standing exercises with stretches first. Patient performed terminal knee extension this session with green theraband for 2 sets of ten holding for 10 seconds. This session added standing hamstring curls. Session ended with manual therapy to improve patient's knee joint mobility. Patient's right knee range of motion is still limited and upcoming sessions should continue to focus on improving patient's knee range of motion with a focus on extension more than flexion.     Rehab Potential  Fair    Clinical Impairments Affecting Rehab Potential  (+) motivation, (+) support system, (-) financial burden, (-) discipline/compliance with HEP    PT Frequency  3x / week    PT Duration  6 weeks    PT Treatment/Interventions  ADLs/Self Care Home Management;Aquatic Therapy;Cryotherapy;Electrical Stimulation;DME Instruction;Gait training;Stair training;Functional mobility  training;Therapeutic activities;Therapeutic exercise;Balance training;Neuromuscular re-education;Cognitive remediation;Patient/family education;Manual techniques;Scar mobilization;Passive range of motion;Energy conservation;Taping    PT Next Visit Plan  Continue focus with ROM extension > flexion.  Continue with manual for edema and education on scar massage. Continue with bike for ROM. If edema continues patient may benefit from compression garment.    PT Home Exercise Plan  Eval: quad set; heels slides, SAQ, LAQ, HS stretch    Consulted and Agree with Plan of Care  Patient       Patient will benefit from skilled therapeutic intervention in order to improve the following deficits and impairments:  Abnormal gait,  Decreased balance, Decreased endurance, Decreased mobility, Decreased skin integrity, Difficulty walking, Hypomobility, Decreased range of motion, Decreased scar mobility, Increased edema, Improper body mechanics, Obesity, Decreased activity tolerance, Decreased knowledge of use of DME, Decreased safety awareness, Decreased strength, Impaired flexibility, Pain  Visit Diagnosis: Acute pain of right knee  Stiffness of right knee, not elsewhere classified  Localized edema  Other abnormalities of gait and mobility     Problem List Patient Active Problem List   Diagnosis Date Noted  . Status post total right knee replacement 12/07/2017  . Unilateral primary osteoarthritis, right knee 11/05/2017  . Primary osteoarthritis of both knees 05/28/2017  . S/P foot surgery, right 12/05/2016  . Pain in right foot 12/05/2016  . Pain from implanted hardware 12/05/2016  . Bilateral carotid bruits 07/08/2014  . Encounter for therapeutic drug monitoring 11/13/2013  . Claudication of left lower extremity ([place]) 12/02/2012  . Neck pain 09/16/2012  . Cervical spondylosis without myelopathy 09/10/2012  . Hx of adenomatous colonic polyps 05/07/2012  . Rectal bleeding 05/07/2012  . GERD (gastroesophageal reflux disease) 05/07/2012  . Long term current use of anticoagulant 01/25/2011  . HYPERLIPIDEMIA 11/21/2010  . DYSPNEA 10/20/2010  . MORBID OBESITY 09/01/2009  . Atrial fibrillation ([place]) 09/01/2009  . Essential hypertension 06/14/2009  . ESOPHAGEAL STRICTURE 06/14/2009  . DIZZINESS 06/14/2009   Clarene Critchley PT, DPT 2:41 PM, 01/11/18 [place], [place], [address] Phone: [phone]   Fax:  [phone]  Name: Brent Mason MRN: [id number] Date of Birth: 1945-12-17

## 2018-01-14 ENCOUNTER — Ambulatory Visit (HOSPITAL_COMMUNITY): Payer: Medicare Other | Attending: Orthopaedic Surgery

## 2018-01-14 DIAGNOSIS — M25561 Pain in right knee: Secondary | ICD-10-CM | POA: Insufficient documentation

## 2018-01-14 DIAGNOSIS — R2689 Other abnormalities of gait and mobility: Secondary | ICD-10-CM | POA: Insufficient documentation

## 2018-01-14 DIAGNOSIS — R600 Localized edema: Secondary | ICD-10-CM | POA: Diagnosis not present

## 2018-01-14 DIAGNOSIS — M25661 Stiffness of right knee, not elsewhere classified: Secondary | ICD-10-CM | POA: Diagnosis not present

## 2018-01-14 NOTE — Therapy (Signed)
[place], [place], [address] Phone: [phone]   Fax:  [phone]  Physical Therapy Treatment  Patient Details  Name: Brent Mason MRN: [id number] Date of Birth: 15-Apr-1946 Referring Provider: Jean Rosenthal, MD   Encounter Date: 01/14/2018  PT End of Session - 01/14/18 1529    Visit Number  7    Number of Visits  19    Date for PT Re-Evaluation  02/11/18    Authorization Type  [employer] [facility] Medicare    Authorization Time Period  12/31/17 - 02/11/18 (reassessment on 4/5)     PT Start Time  1520    PT Stop Time  1600    PT Time Calculation (min)  40 min    Activity Tolerance  Patient tolerated treatment well;No increased pain    Behavior During Therapy  WFL for tasks assessed/performed       Past Medical History:  Diagnosis Date  . Anemia   . Anticoagulated on Coumadin    long-term  . Chronic kidney disease    frequent uti's last uti few yrs ago  . Diastolic CHF, chronic ([place])   . Dysrhythmia   . GERD (gastroesophageal reflux disease)   . Headache   . Hiatal hernia   . History of adenomatous polyp of colon   . History of esophageal stricture    s/p  dilatations  . History of GI bleed    03/ 2003  per EGD gastritis  . HOH (hard of hearing)    both ears  . Hypertension   . Non-rheumatic aortic stenosis    per last echo 12-14-2015    . OA (osteoarthritis)    oa  . Persistent atrial fibrillation [facility]) first dx  1999   primary cardiologist-  dr taylor-- hx DCCV  . Pneumonia 2009   with cpap usage  . Shortness of breath    when exertion  . Sleep apnea    last sleep study > 5 yrs   no cpap used    Past Surgical History:  Procedure Laterality Date  . ABDOMINAL HERNIA REPAIR  1990s  . ANTERIOR CERVICAL DECOMP/DISCECTOMY FUSION N/A 02/14/2013   Procedure: ANTERIOR CERVICAL DECOMPRESSION/DISCECTOMY FUSION 2 LEVELS;  Surgeon: Charlie Pitter, MD;  Location: [place] NEURO ORS;  Service:  Neurosurgery;  Laterality: N/A;  . BALLOON DILATION N/A 12/11/2017   Procedure: BALLOON DILATION;  Surgeon: Clarene Essex, MD;  [facility];  Service: Endoscopy;  Laterality: N/A;  . COLONOSCOPY  last one 06-03-2012  . COLONOSCOPY  06/03/2012   Procedure: COLONOSCOPY;  Surgeon: Daneil Dolin, MD;  [facility];  Service: Endoscopy;  Laterality: N/A;  8:30  . esophageal strecching  yrs ago  . ESOPHAGOGASTRODUODENOSCOPY (EGD) WITH PROPOFOL N/A 12/11/2017   Procedure: ESOPHAGOGASTRODUODENOSCOPY (EGD) WITH PROPOFOL;  Surgeon: Clarene Essex, MD;  [facility];  Service: Endoscopy;  Laterality: N/A;  . ESOPHAGUS SURGERY  20 yrs ago   for food sticking  . FOOT SURGERY Right 04-27-2010   dr duda   joint arthrodesis  . right foot surgery  2018   to remove screw  . TOTAL KNEE ARTHROPLASTY Right 12/07/2017   Procedure: RIGHT TOTAL KNEE ARTHROPLASTY;  Surgeon: Mcarthur Rossetti, MD;  [facility];  Service: Orthopedics;  Laterality: Right;  . TRANSTHORACIC ECHOCARDIOGRAM  12-14-2015  dr taylor   moderate LVH, ef 55-60%, indeterminate diastolic function in setting of AFib/  moderate LAE and RAE/  mild to  moderate calcific aoric stenosis with mild AR (valve area 1.33cm^2, peak grandient36mmHg, mean grandient 61mmHg)/ trivial MR/ mild TR/ PASP 75mmHg  . VOLVULUS REDUCTION  [id number]    There were no vitals filed for this visit.  Subjective Assessment - 01/14/18 1523    Subjective  Pt doing ok today. HEP going 'pretty good.' Knee pain is about 4/10.     Currently in Pain?  Yes    Pain Score  3     Pain Location  Knee    Pain Orientation  Right                       [facility] Adult PT Treatment/Exercise - 01/14/18 0001      Bed Mobility   Bed Mobility  -- 000.........................................................      Transfers   Five time sit to stand comments   14sec hands free      Ambulation/Gait   Ambulation/Gait Assistance  6: Modified  independent (Device/Increase time)    Ambulation Distance (Feet)  225 Feet    Assistive device  Straight cane    Gait velocity  0.73m/s      Knee/Hip Exercises: Stretches   Passive Hamstring Stretch  3 reps;30 seconds;Limitations    Passive Hamstring Stretch Limitations  on 12 inch step    Knee: Self-Stretch to increase Flexion  Right 10x5sec     Knee: Self-Stretch Limitations  knee drive on 56OZ step    Gastroc Stretch  30 seconds;2 reps Slant board    Other Knee/Hip Stretches  supine, TKE stretch c 7.5lb weight over knee 5x60sec      Knee/Hip Exercises: Standing   Heel Raises  10 reps 10 reps heel raises 10 reps toe raises. Bilat UE support    Knee Flexion  AROM;Strengthening;1 set;10 reps;Right;Other (comment) Hamstring curl with bilateral upper extremity support    Other Standing Knee Exercises  narrow stance: balance on airex, 3x30sec arm cross chest VC for TKE       Knee/Hip Exercises: Seated   Sit to Sand  2 sets;10 reps;without UE support      Knee/Hip Exercises: Supine   Short Arc Quad Sets  Right;Limitations;5 sets;10 reps    Short Arc Quad Sets Limitations  over basketball    Heel Slides  10 reps;Right;AROM    Bridges  Both;2 sets;10 reps extremely weak, will be functionally limiting     Knee Extension  PROM    Knee Extension Limitations  29 degrees correct femoral ER prior to measuring    Knee Flexion  PROM    Knee Flexion Limitations  108      Manual Therapy   Manual Therapy  Joint mobilization    Joint Mobilization  5x30sec Superior patella glide with SAQ assist  (mobilization with movement)                PT Short Term Goals - 12/31/17 1805      PT SHORT TERM GOAL #1   Title  Patient will be independent with HEP to reduce edema, and improve functional strength to return to PLOF and improve gait/mobility mechanics.     Time  3    Period  Weeks    Status  New    Target Date  01/21/18      PT SHORT TERM GOAL #2   Title  Patient will improve MMT by  1/2 grade for limited muscle groups to improve muscle endurance and gait/mobility performance.    Time  3    Period  Weeks    Status  New      PT SHORT TERM GOAL #3   Title  Patient will improve ROM for right knee extension/flexion by 10 degrees both directions to improve functional gait/mobility to improve stair ambulation to enter home an increase community access.    Time  3    Period  Weeks    Status  New      PT SHORT TERM GOAL #4   Title  Patient will reduce edema by of Rt knee to be no more than 1/2 an inch larger than the Lt knee to improve joint ROM, decrease pain, and improve muscle activation for greater functional strength.    Time  3    Period  Weeks    Status  New        PT Long Term Goals - 12/31/17 1809      PT LONG TERM GOAL #1   Title  Patient will improve MMT by 1 grade for limited muscle groups to improve muscle endurance and gait/mobility performance.    Time  6    Period  Weeks    Status  New    Target Date  02/11/18      PT LONG TERM GOAL #2   Title  Patient will perform 3MWT with LRAD and gait velocity of 1.0 m/s to indicate he is at decreased risk of falling and fall into the community ambulator category to retrn to PLOF with community acitvities with friends.     Time  6    Period  Weeks    Status  New      PT LONG TERM GOAL #3   Title  Patient with improve ROM for Rt knee for 0-110 degrees for more functional gait and improved ability to ascend/descend stairs.    Time  6    Period  Weeks    Status  New            Plan - 01/14/18 1531    Clinical Impression Statement  Continued focus on joint mobility and isolated activation. Pt needs close supervision for quality and counting of exercises in session. Calves remain extrememly weak, would benefit from seated heel raisesd for better ROM. Incorporated some airex pad this session for joint stabilization as well. Pt progressing well toward goals. Strength with transfers is improving, but AMB  remains very limited. Flexion ROM remains extrmemly limited, at 29 degrees today, surprising that patient is not having difficulty with buckling during gait.     Rehab Potential  Fair    Clinical Impairments Affecting Rehab Potential  (+) motivation, (+) support system, (-) financial burden, (-) discipline/compliance with HEP    PT Frequency  3x / week    PT Duration  6 weeks    PT Treatment/Interventions  ADLs/Self Care Home Management;Aquatic Therapy;Cryotherapy;Electrical Stimulation;DME Instruction;Gait training;Stair training;Functional mobility training;Therapeutic activities;Therapeutic exercise;Balance training;Neuromuscular re-education;Cognitive remediation;Patient/family education;Manual techniques;Scar mobilization;Passive range of motion;Energy conservation;Taping    PT Next Visit Plan  Continue focus with ROM extension > flexion.  Continue with manual for edema and education on scar massage. Continue with bike for ROM. If edema continues patient may benefit from compression garment.    PT Home Exercise Plan  Eval: quad set; heels slides, SAQ, LAQ, HS stretch    Consulted and Agree with Plan of Care  Patient       Patient will benefit from skilled therapeutic intervention in order to improve the following deficits and  impairments:  Abnormal gait, Decreased balance, Decreased endurance, Decreased mobility, Decreased skin integrity, Difficulty walking, Hypomobility, Decreased range of motion, Decreased scar mobility, Increased edema, Improper body mechanics, Obesity, Decreased activity tolerance, Decreased knowledge of use of DME, Decreased safety awareness, Decreased strength, Impaired flexibility, Pain  Visit Diagnosis: Acute pain of right knee  Stiffness of right knee, not elsewhere classified  Localized edema  Other abnormalities of gait and mobility     Problem List Patient Active Problem List   Diagnosis Date Noted  . Status post total right knee replacement 12/07/2017   . Unilateral primary osteoarthritis, right knee 11/05/2017  . Primary osteoarthritis of both knees 05/28/2017  . S/P foot surgery, right 12/05/2016  . Pain in right foot 12/05/2016  . Pain from implanted hardware 12/05/2016  . Bilateral carotid bruits 07/08/2014  . Encounter for therapeutic drug monitoring 11/13/2013  . Claudication of left lower extremity ([place]) 12/02/2012  . Neck pain 09/16/2012  . Cervical spondylosis without myelopathy 09/10/2012  . Hx of adenomatous colonic polyps 05/07/2012  . Rectal bleeding 05/07/2012  . GERD (gastroesophageal reflux disease) 05/07/2012  . Long term current use of anticoagulant 01/25/2011  . HYPERLIPIDEMIA 11/21/2010  . DYSPNEA 10/20/2010  . MORBID OBESITY 09/01/2009  . Atrial fibrillation ([place]) 09/01/2009  . Essential hypertension 06/14/2009  . ESOPHAGEAL STRICTURE 06/14/2009  . DIZZINESS 06/14/2009   4:01 PM, 01/14/18 Etta Grandchild, PT, DPT Physical Therapist at [facility] [phone] (office)      Etta Grandchild 01/14/2018, 4:00 PM  [place] [address] [place], [place], [address] Phone: [phone]   Fax:  [phone]  Name: Brent Mason MRN: [id number] Date of Birth: 1946-01-21

## 2018-01-16 ENCOUNTER — Other Ambulatory Visit: Payer: Self-pay

## 2018-01-16 ENCOUNTER — Encounter (HOSPITAL_COMMUNITY): Payer: Self-pay

## 2018-01-16 ENCOUNTER — Ambulatory Visit (HOSPITAL_COMMUNITY): Payer: Medicare Other

## 2018-01-16 DIAGNOSIS — R2689 Other abnormalities of gait and mobility: Secondary | ICD-10-CM

## 2018-01-16 DIAGNOSIS — M25661 Stiffness of right knee, not elsewhere classified: Secondary | ICD-10-CM

## 2018-01-16 DIAGNOSIS — M25561 Pain in right knee: Secondary | ICD-10-CM | POA: Diagnosis not present

## 2018-01-16 DIAGNOSIS — R600 Localized edema: Secondary | ICD-10-CM

## 2018-01-16 NOTE — Therapy (Signed)
[facility] [place], [place], [address] Phone: [phone]   Fax:  [phone]  Physical Therapy Treatment  Patient Details  Name: Brent Mason MRN: [id number] Date of Birth: [birth year] Referring Provider: Jean Rosenthal    Encounter Date: 01/16/2018  PT End of Session - 01/16/18 1800    Visit Number  8    Number of Visits  19    Date for PT Re-Evaluation  02/11/18 mini-reassess on 01/18/18 prior to MD appointment    Authorization Type  [facility] Medicare    Authorization Time Period  12/31/17 - 02/11/18 (reassessment on 4/5)     PT Start Time  1650    PT Stop Time  1732    PT Time Calculation (min)  42 min    Activity Tolerance  Patient tolerated treatment well;No increased pain    Behavior During Therapy  WFL for tasks assessed/performed       Past Medical History:  Diagnosis Date  . Anemia   . Anticoagulated on Coumadin    long-term  . Chronic kidney disease    frequent uti's last uti few yrs ago  . Diastolic CHF, chronic ([place])   . Dysrhythmia   . GERD (gastroesophageal reflux disease)   . Headache   . Hiatal hernia   . History of adenomatous polyp of colon   . History of esophageal stricture    s/p  dilatations  . History of GI bleed    03/ 2003  per EGD gastritis  . HOH (hard of hearing)    both ears  . Hypertension   . Non-rheumatic aortic stenosis    per last echo 12-14-2015    . OA (osteoarthritis)    oa  . Persistent atrial fibrillation [facility]) first dx  1999   primary cardiologist-  dr taylor-- hx DCCV  . Pneumonia 2009   with cpap usage  . Shortness of breath    when exertion  . Sleep apnea    last sleep study > 5 yrs   no cpap used    Past Surgical History:  Procedure Laterality Date  . ABDOMINAL HERNIA REPAIR  1990s  . ANTERIOR CERVICAL DECOMP/DISCECTOMY FUSION N/A 02/14/2013   Procedure: ANTERIOR CERVICAL DECOMPRESSION/DISCECTOMY FUSION 2 LEVELS;  Surgeon: Charlie Pitter, MD;  Location: [facility];  Service: Neurosurgery;  Laterality: N/A;  . BALLOON DILATION N/A 12/11/2017   Procedure: BALLOON DILATION;  Surgeon: Clarene Essex, MD;  Location: [facility];  Service: Endoscopy;  Laterality: N/A;  . COLONOSCOPY  last one 06-03-2012  . COLONOSCOPY  06/03/2012   Procedure: COLONOSCOPY;  Surgeon: Daneil Dolin, MD;  Location: [facility];  Service: Endoscopy;  Laterality: N/A;  8:30  . esophageal strecching  yrs ago  . ESOPHAGOGASTRODUODENOSCOPY (EGD) WITH PROPOFOL N/A 12/11/2017   Procedure: ESOPHAGOGASTRODUODENOSCOPY (EGD) WITH PROPOFOL;  Surgeon: Clarene Essex, MD;  Location: [facility];  Service: Endoscopy;  Laterality: N/A;  . ESOPHAGUS SURGERY  20 yrs ago   for food sticking  . FOOT SURGERY Right 04-27-2010   dr duda   joint arthrodesis  . right foot surgery  2018   to remove screw  . TOTAL KNEE ARTHROPLASTY Right 12/07/2017   Procedure: RIGHT TOTAL KNEE ARTHROPLASTY;  Surgeon: Mcarthur Rossetti, MD;  Location: [facility];  Service: Orthopedics;  Laterality: Right;  . TRANSTHORACIC ECHOCARDIOGRAM  12-14-2015  dr taylor   moderate LVH, ef 55-60%, indeterminate diastolic function in setting of AFib/  moderate LAE and RAE/  mild to moderate calcific aoric stenosis with mild AR (valve area 1.33cm^2, peak grandient18mmHg, mean grandient 77mmHg)/ trivial MR/ mild TR/ PASP 59mmHg  . VOLVULUS REDUCTION  [id number]    There were no vitals filed for this visit.  Subjective Assessment - 01/16/18 1654    Subjective  Patient is doing alright today, he reports his exercises are going well and he is doing them about 2x per day. He is overall very frustrated with rehab process and wants to be able to drive again.     Limitations  Sitting;Standing;Walking;House hold activities    Patient Stated Goals  get back to normal self and be able to drive to breakfast with my friends     Currently in Pain?  Yes    Pain Score  4     Pain Location  Knee    Pain Orientation   Right    Pain Descriptors / Indicators  Aching;Tightness;Sore    Pain Type  Surgical pain;Chronic pain    Pain Onset  More than a month ago    Pain Frequency  Constant    Aggravating Factors   bendign knee    Pain Relieving Factors  pain medicine    Effect of Pain on Daily Activities  difficult to walk        [facility] Adult PT Treatment/Exercise - 01/16/18 0001      Knee/Hip Exercises: Stretches   Passive Hamstring Stretch  3 reps;30 seconds;Limitations    Passive Hamstring Stretch Limitations  on 12 inch step; AP pressure on femur for increased stretch    Knee: Self-Stretch to increase Flexion  Right;3 reps;30 seconds    Knee: Self-Stretch Limitations  knee drive on 62XB step    Gastroc Stretch  30 seconds;3 reps;Both;Limitations    Gastroc Stretch Limitations  slant board      Knee/Hip Exercises: Standing   Heel Raises  2 sets;10 reps      Knee/Hip Exercises: Seated   Long Arc Quad  Right;2 sets;10 reps;Limitations    Long Arc Quad Limitations  3 second holds, cues for full ROM      Knee/Hip Exercises: Supine   Quad Sets  AROM;Right;10 reps;Limitations;2 sets    Quad Sets Limitations  5 second hold    Heel Slides  10 reps;Right;AROM;2 sets        PT Education - 01/16/18 1656    Education provided  Yes    Education Details  Extensive time spent this session educating patient on benefits of therapy and importance of HEP participation to improve mobility and ROM. Reviewed and educated on HEP and importance of moving through entire available ROM during exercises. Educated on typical progression of TKA rehab and timeline of therapy. Educated on proper precautions such as not placing pillows under his knee and performing LAQ when sitting for extended amounts of time to improve knee extension ROM.    Person(s) Educated  Patient    Methods  Explanation    Comprehension  Need further instruction;Verbalized understanding       PT Short Term Goals - 12/31/17 1805      PT SHORT TERM  GOAL #1   Title  Patient will be independent with HEP to reduce edema, and improve functional strength to return to PLOF and improve gait/mobility mechanics.     Time  3    Period  Weeks    Status  New    Target Date  01/21/18      PT  SHORT TERM GOAL #2   Title  Patient will improve MMT by 1/2 grade for limited muscle groups to improve muscle endurance and gait/mobility performance.    Time  3    Period  Weeks    Status  New      PT SHORT TERM GOAL #3   Title  Patient will improve ROM for right knee extension/flexion by 10 degrees both directions to improve functional gait/mobility to improve stair ambulation to enter home an increase community access.    Time  3    Period  Weeks    Status  New      PT SHORT TERM GOAL #4   Title  Patient will reduce edema by of Rt knee to be no more than 1/2 an inch larger than the Lt knee to improve joint ROM, decrease pain, and improve muscle activation for greater functional strength.    Time  3    Period  Weeks    Status  New        PT Long Term Goals - 12/31/17 1809      PT LONG TERM GOAL #1   Title  Patient will improve MMT by 1 grade for limited muscle groups to improve muscle endurance and gait/mobility performance.    Time  6    Period  Weeks    Status  New    Target Date  02/11/18      PT LONG TERM GOAL #2   Title  Patient will perform 3MWT with LRAD and gait velocity of 1.0 m/s to indicate he is at decreased risk of falling and fall into the community ambulator category to retrn to PLOF with community acitvities with friends.     Time  6    Period  Weeks    Status  New      PT LONG TERM GOAL #3   Title  Patient with improve ROM for Rt knee for 0-110 degrees for more functional gait and improved ability to ascend/descend stairs.    Time  6    Period  Weeks    Status  New            Plan - 01/16/18 1802    Clinical Impression Statement  Extensive time spent this session educating patient on benefits of therapy and  importance of HEP participation to improve mobility and ROM. Patient had poor form while performing HEP exercises and required maximal verbal/tactile cues to achieve proper form and perform complete set/repetitions. He required cues to move through full ROM during exercises. He has expressed frustration with rehab process and therapy and will continue to require encouragement to improve mobility and ROM impairments. He will continue to benefit from skilled PT services to address impairments and continue to educate on HEP to improve independence with strengthening and mobility.     Rehab Potential  Fair    Clinical Impairments Affecting Rehab Potential  (+) motivation, (+) support system, (-) financial burden, (-) discipline/compliance with HEP    PT Frequency  3x / week    PT Duration  6 weeks    PT Treatment/Interventions  ADLs/Self Care Home Management;Aquatic Therapy;Cryotherapy;Electrical Stimulation;DME Instruction;Gait training;Stair training;Functional mobility training;Therapeutic activities;Therapeutic exercise;Balance training;Neuromuscular re-education;Cognitive remediation;Patient/family education;Manual techniques;Scar mobilization;Passive range of motion;Energy conservation;Taping    PT Next Visit Plan  Re-assess for half way point. Continue focus with ROM extension > flexion.  Continue with manual for edema and education on scar massage. Continue with bike for ROM. If edema continues patient  may benefit from compression garment.    PT Home Exercise Plan  Eval: quad set; heels slides, SAQ, LAQ, HS stretch    Consulted and Agree with Plan of Care  Patient       Patient will benefit from skilled therapeutic intervention in order to improve the following deficits and impairments:  Abnormal gait, Decreased balance, Decreased endurance, Decreased mobility, Decreased skin integrity, Difficulty walking, Hypomobility, Decreased range of motion, Decreased scar mobility, Increased edema, Improper  body mechanics, Obesity, Decreased activity tolerance, Decreased knowledge of use of DME, Decreased safety awareness, Decreased strength, Impaired flexibility, Pain  Visit Diagnosis: Acute pain of right knee  Stiffness of right knee, not elsewhere classified  Localized edema  Other abnormalities of gait and mobility     Problem List Patient Active Problem List   Diagnosis Date Noted  . Status post total right knee replacement 12/07/2017  . Unilateral primary osteoarthritis, right knee 11/05/2017  . Primary osteoarthritis of both knees 05/28/2017  . S/P foot surgery, right 12/05/2016  . Pain in right foot 12/05/2016  . Pain from implanted hardware 12/05/2016  . Bilateral carotid bruits 07/08/2014  . Encounter for therapeutic drug monitoring 11/13/2013  . Claudication of left lower extremity ([place]) 12/02/2012  . Neck pain 09/16/2012  . Cervical spondylosis without myelopathy 09/10/2012  . Hx of adenomatous colonic polyps 05/07/2012  . Rectal bleeding 05/07/2012  . GERD (gastroesophageal reflux disease) 05/07/2012  . Long term current use of anticoagulant 01/25/2011  . HYPERLIPIDEMIA 11/21/2010  . DYSPNEA 10/20/2010  . MORBID OBESITY 09/01/2009  . Atrial fibrillation ([place]) 09/01/2009  . Essential hypertension 06/14/2009  . ESOPHAGEAL STRICTURE 06/14/2009  . DIZZINESS 06/14/2009    Kipp Brood, PT, DPT Physical Therapist with [facility]  01/16/2018 6:15 PM    [facility] [address] [place], [place], [address] Phone: [phone]   Fax:  [phone]  Name: Brent Mason MRN: [id number] Date of Birth: June 02, 1946

## 2018-01-18 ENCOUNTER — Telehealth (HOSPITAL_COMMUNITY): Payer: Self-pay | Admitting: Internal Medicine

## 2018-01-18 ENCOUNTER — Ambulatory Visit (HOSPITAL_COMMUNITY): Payer: Medicare Other | Admitting: Physical Therapy

## 2018-01-18 NOTE — Telephone Encounter (Signed)
01/18/18  patient's daughter left a message to cx this appt but no reason was given

## 2018-01-21 ENCOUNTER — Encounter (INDEPENDENT_AMBULATORY_CARE_PROVIDER_SITE_OTHER): Payer: Self-pay | Admitting: Orthopaedic Surgery

## 2018-01-21 ENCOUNTER — Ambulatory Visit (INDEPENDENT_AMBULATORY_CARE_PROVIDER_SITE_OTHER): Payer: Medicare Other | Admitting: Orthopaedic Surgery

## 2018-01-21 DIAGNOSIS — Z96651 Presence of right artificial knee joint: Secondary | ICD-10-CM

## 2018-01-21 MED ORDER — DOXYCYCLINE HYCLATE 100 MG PO TABS: 100.0000 mg | ORAL_TABLET | Freq: Two times a day (BID) | ORAL | 0 refills | Status: DC

## 2018-01-21 NOTE — Progress Notes (Signed)
The patient is now about 6 weeks status post a right total knee arthroplasty.  He is ambulating with a walking stick.  He is been going to physical therapy on his knee.  On exam he still lacks full extension by about 5 degrees but his flexion is much improved to past 100 degrees.  The knee feels like mostly stable.  There is just a slight reddish hue at one part of the incision but this is minimal.  The knee feels like mostly stable otherwise.  He will continue outpatient physical therapy.  He can drive from my standpoint.  We want him to work on aggressive therapy at home as well daily.  I would have him try just 2 weeks of doxycycline due to the small redness that I am saying but again there is no warmth no drainage and no pain at all.  We will see him back in a month see how is doing overall other than if these have any other issues acutely he will see [place] sooner.

## 2018-01-22 ENCOUNTER — Telehealth: Payer: Self-pay | Admitting: Internal Medicine

## 2018-01-22 NOTE — Telephone Encounter (Signed)
I will forward to Dr Lovena Le for decision

## 2018-01-22 NOTE — Telephone Encounter (Signed)
Pt had ECHO in February, no reason to repeat in April.  Appears to be a duplicate order.  Order cancelled.  Notified Pt. No further action needed.

## 2018-01-22 NOTE — Telephone Encounter (Signed)
Spoke with pt. He questioned whether he would need to do a repeat echo in April since he has already had one in February during a hospital stay. Please advise.

## 2018-01-22 NOTE — Telephone Encounter (Signed)
Patient is scheduled for echo on 4/22. Had one done on hosp admission on 2/27. Does he still need the 4/22 one? / tg

## 2018-01-23 ENCOUNTER — Ambulatory Visit (HOSPITAL_COMMUNITY): Payer: Medicare Other

## 2018-01-23 ENCOUNTER — Encounter (HOSPITAL_COMMUNITY): Payer: Self-pay

## 2018-01-23 DIAGNOSIS — M25661 Stiffness of right knee, not elsewhere classified: Secondary | ICD-10-CM

## 2018-01-23 DIAGNOSIS — M25561 Pain in right knee: Secondary | ICD-10-CM

## 2018-01-23 DIAGNOSIS — R2689 Other abnormalities of gait and mobility: Secondary | ICD-10-CM

## 2018-01-23 DIAGNOSIS — R600 Localized edema: Secondary | ICD-10-CM

## 2018-01-23 NOTE — Therapy (Signed)
[place], [place], [address] Phone: [phone]   Fax:  [phone]  Physical Therapy Treatment  Patient Details  Name: Brent Mason MRN: [id number] Date of Birth: 04/07/1946 Referring Provider: Forrestine Him, MD   Encounter Date: 01/23/2018  PT End of Session - 01/23/18 1529    Visit Number  9    Number of Visits  19    Date for PT Re-Evaluation  02/11/18 minireassessment complete on 01/23/18    Authorization Type  [employer] [facility] Medicare    Authorization Time Period  12/31/17 - 02/11/18 (reassessment on 4/5)     Authorization - Visit Number  9    Authorization - Number of Visits  19    PT Start Time  7408    PT Stop Time  1448    PT Time Calculation (min)  41 min    Activity Tolerance  Patient tolerated treatment well;No increased pain    Behavior During Therapy  WFL for tasks assessed/performed       Past Medical History:  Diagnosis Date  . Anemia   . Anticoagulated on Coumadin    long-term  . Chronic kidney disease    frequent uti's last uti few yrs ago  . Diastolic CHF, chronic ([place])   . Dysrhythmia   . GERD (gastroesophageal reflux disease)   . Headache   . Hiatal hernia   . History of adenomatous polyp of colon   . History of esophageal stricture    s/p  dilatations  . History of GI bleed    03/ 2003  per EGD gastritis  . HOH (hard of hearing)    both ears  . Hypertension   . Non-rheumatic aortic stenosis    per last echo 12-14-2015    . OA (osteoarthritis)    oa  . Persistent atrial fibrillation [facility]) first dx  1999   primary cardiologist-  dr taylor-- hx DCCV  . Pneumonia 2009   with cpap usage  . Shortness of breath    when exertion  . Sleep apnea    last sleep study > 5 yrs   no cpap used    Past Surgical History:  Procedure Laterality Date  . ABDOMINAL HERNIA REPAIR  1990s  . ANTERIOR CERVICAL DECOMP/DISCECTOMY FUSION N/A 02/14/2013   Procedure: ANTERIOR  CERVICAL DECOMPRESSION/DISCECTOMY FUSION 2 LEVELS;  Surgeon: Charlie Pitter, MD;  Location: [facility];  Service: Neurosurgery;  Laterality: N/A;  . BALLOON DILATION N/A 12/11/2017   Procedure: BALLOON DILATION;  Surgeon: Clarene Essex, MD;  [facility]: [facility];  Service: Endoscopy;  Laterality: N/A;  . COLONOSCOPY  last one 06-03-2012  . COLONOSCOPY  06/03/2012   Procedure: COLONOSCOPY;  Surgeon: Daneil Dolin, MD;  [facility];  Service: Endoscopy;  Laterality: N/A;  8:30  . esophageal strecching  yrs ago  . ESOPHAGOGASTRODUODENOSCOPY (EGD) WITH PROPOFOL N/A 12/11/2017   Procedure: ESOPHAGOGASTRODUODENOSCOPY (EGD) WITH PROPOFOL;  Surgeon: Clarene Essex, MD;  [facility];  Service: Endoscopy;  Laterality: N/A;  . ESOPHAGUS SURGERY  20 yrs ago   for food sticking  . FOOT SURGERY Right 04-27-2010   dr duda   joint arthrodesis  . right foot surgery  2018   to remove screw  . TOTAL KNEE ARTHROPLASTY Right 12/07/2017   Procedure: RIGHT TOTAL KNEE ARTHROPLASTY;  Surgeon: Mcarthur Rossetti, MD;  [facility]: [facility];  Service: Orthopedics;  Laterality: Right;  . TRANSTHORACIC ECHOCARDIOGRAM  12-14-2015  dr taylor   moderate LVH, ef 55-60%, indeterminate diastolic function in setting of AFib/  moderate LAE and RAE/  mild to moderate calcific aoric stenosis with mild AR (valve area 1.33cm^2, peak grandient27mHg, mean grandient 117mg)/ trivial MR/ mild TR/ PASP 3372m  . VOLVULUS REDUCTION  [id number] There were no vitals filed for this visit.  Subjective Assessment - 01/23/18 1522    Subjective  Pt stated he went to [place] on Monday, encouraged to continue for 2 more weeks.  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  Pt stated his wife fell on last apt day, unable to make it.  Pt stated the knee is feeling okay, pain minimal 1-2/10 mainly stiff and achey pain.      How long can you sit comfortably?  15-20 minutes (was several minutes but it is stiff  after prolonged sitting)    How long can you stand comfortably?  10-15 minutes (a couple minutes before it starts gettign more sore)    How long can you walk comfortably?  10-15 minutes (was a few minutes)    Patient Stated Goals  get back to normal self and be able to drive to breakfast with my friends     Currently in Pain?  Yes    Pain Score  2     Pain Location  Knee    Pain Orientation  Right    Pain Descriptors / Indicators  Tightness;Aching    Pain Type  Surgical pain;Chronic pain    Pain Onset  More than a month ago    Pain Frequency  Constant    Aggravating Factors   bending knee    Pain Relieving Factors  pain medicine    Effect of Pain on Daily Activities  difficult to walk         [facility] - 01/23/18 0001      Assessment   Medical Diagnosis  Right TKA    Referring Provider  BlaForrestine HimD    Onset Date/Surgical Date  12/07/17    Next MD Visit  ~4 more weeks    Prior Therapy  HHPT, Friday last ession      Observation/Other Assessments   Focus on Therapeutic Outcomes (FOTO)   50% limited was 68% limited      Observation/Other Assessments-Edema    Edema  Circumferential      Circumferential Edema   Circumferential - Right  17 was 17.5    Circumferential - Left   15.5 was 15.5      AROM   AROM Assessment Site  Knee    Right/Left Knee  Right;Left    Right Knee Extension  14 was 18    Right Knee Flexion  110 was 94    Left Knee Extension  4    Left Knee Flexion  130      Strength   Strength Assessment Site  Hip;Knee    Right/Left Hip  Right;Left    Right Hip Flexion  4/5 was 3+    Right Hip Extension  3/5 was 3-    Right Hip ABduction  4-/5 was 3+    Left Hip Flexion  4+/5 was 3+    Left Hip Extension  3/5 was 3-    Left Hip ABduction  4+/5 was 3+    Right/Left Knee  Right;Left    Right Knee Flexion  4+/5 was 4+    Right Knee Extension  4+/5  was 4+    Left Knee Flexion  5/5    Left Knee Extension  5/5    Right Ankle  Dorsiflexion  4+/5 was 4+/5    Left Ankle Dorsiflexion  5/5      Ambulation/Gait   Ambulation Distance (Feet)  512 Feet    Assistive device  None    Gait Pattern  Step-to pattern;Decreased step length - left;Decreased stance time - right;Decreased stride length;Decreased hip/knee flexion - right;Decreased weight shift to right;Right flexed knee in stance;Antalgic;Wide base of support    Ambulation Surface  Level    Gait velocity  .75                   [facility] Adult PT Treatment/Exercise - 01/23/18 0001      Knee/Hip Exercises: Stretches   Passive Hamstring Stretch  3 reps;30 seconds;Limitations    Passive Hamstring Stretch Limitations  on 12 inch step; AP pressure on femur for increased stretch    Knee: Self-Stretch to increase Flexion  Right;10 seconds    Knee: Self-Stretch Limitations  knee drive on 94HW step    Gastroc Stretch  30 seconds;3 reps;Both;Limitations    Gastroc Stretch Limitations  slant board      Knee/Hip Exercises: Standing   Stairs  2RT 7in height initially step to then reciprocal up and step to down, handrails needed and cueing for mechanics    Gait Training  3MWT: 512 ft= .79ms      Knee/Hip Exercises: Seated   Long Arc Quad  Right;2 sets;10 reps;Limitations    Long Arc Quad Limitations  3 second holds, cues for full ROM      Knee/Hip Exercises: Supine   Quad Sets  AROM;Right;10 reps;Limitations;2 sets    [employer] CorporationLimitations  5" holds    Short Arc [employer] 15 reps    Short Arc Quad Sets Limitations  over bolster     Heel Slides  10 reps;Right;AROM;2 sets    Knee Extension  AROM    Knee Extension Limitations  14    Knee Flexion  AROM    Knee Flexion Limitations  110               PT Short Term Goals - 01/23/18 1530      PT SHORT TERM GOAL #1   Title  Patient will be independent with HEP to reduce edema, and improve functional strength to return to PLOF and improve gait/mobility mechanics.     Baseline  01/23/18:  Reports  complaince 2x/ daily though unable to recall    Status  Partially Met      PT SHORT TERM GOAL #2   Title  Patient will improve MMT by 1/2 grade for limited muscle groups to improve muscle endurance and gait/mobility performance.    Baseline  See MMT    Status  Partially Met      PT SHORT TERM GOAL #3   Title  Patient will improve ROM for right knee extension/flexion by 10 degrees both directions to improve functional gait/mobility to improve stair ambulation to enter home an increase community access.    Baseline  4/10: AROM 14-110 degrees    Status  On-going      PT SHORT TERM GOAL #4   Title  Patient will reduce edema by of Rt knee to be no more than 1/2 an inch larger than the Lt knee to improve joint ROM, decrease pain, and improve muscle activation for greater functional strength.  Baseline  4/10: No improvements with edema measurements.  Discussed benefits with compression hose to assist with swelling, pt not interested in hose to assist with edema.    Status  Not Met        PT Long Term Goals - 01/23/18 1539      PT LONG TERM GOAL #1   Title  Patient will improve MMT by 1 grade for limited muscle groups to improve muscle endurance and gait/mobility performance.    Status  On-going      PT LONG TERM GOAL #2   Title  Patient will perform 3MWT with LRAD and gait velocity of 1.0 m/s to indicate he is at decreased risk of falling and fall into the community ambulator category to retrn to PLOF with community acitvities with friends.     Baseline  4/10: 3MWT no AD and gait velocity .86 m/s    Status  On-going      PT LONG TERM GOAL #3   Title  Patient with improve ROM for Rt knee for 0-110 degrees for more functional gait and improved ability to ascend/descend stairs.    Baseline  4/10: AROM 14-110 degrees     Status  Partially Met            Plan - 01/23/18 1614    Clinical Impression Statement  Minireassessment complete this session.  Pt reports to continues OPPT for  2 more weeks per MD.  Pt presents wihtout AD this session and minimal reoprts of pain.  AROM 14-110 degrees with verbal and tactile cueing to improve quadricep contractions and reduce gluteal activation.  Pt reports compliance with HEP 2x/daily though unable to recall current HEP.  Strength has improved 1/2 grade with hip musculature.  Edema continues proximal knee.  Pt educated on benefits with compression hose though reports he is not interested.  Pt reports ability to complete all ADLs now with minimal difficulty and ability to drive now.  Pt will continue to benefit from skilled PT services to address impairments.    Rehab Potential  Fair    Clinical Impairments Affecting Rehab Potential  (+) motivation, (+) support system, (-) financial burden, (-) discipline/compliance with HEP    PT Frequency  3x / week    PT Duration  6 weeks    PT Treatment/Interventions  ADLs/Self Care Home Management;Aquatic Therapy;Cryotherapy;Electrical Stimulation;DME Instruction;Gait training;Stair training;Functional mobility training;Therapeutic activities;Therapeutic exercise;Balance training;Neuromuscular re-education;Cognitive remediation;Patient/family education;Manual techniques;Scar mobilization;Passive range of motion;Energy conservation;Taping    PT Next Visit Plan  Continue focus with ROM extension > flexion.  Continue with manual for edema and education on scar massage. Continue with bike for ROM. If edema continues patient may benefit from compression garment.    PT Home Exercise Plan  Eval: quad set; heels slides, SAQ, LAQ, HS stretch       Patient will benefit from skilled therapeutic intervention in order to improve the following deficits and impairments:  Abnormal gait, Decreased balance, Decreased endurance, Decreased mobility, Decreased skin integrity, Difficulty walking, Hypomobility, Decreased range of motion, Decreased scar mobility, Increased edema, Improper body mechanics, Obesity, Decreased activity  tolerance, Decreased knowledge of use of DME, Decreased safety awareness, Decreased strength, Impaired flexibility, Pain  Visit Diagnosis: Acute pain of right knee  Stiffness of right knee, not elsewhere classified  Localized edema  Other abnormalities of gait and mobility     Problem List Patient Active Problem List   Diagnosis Date Noted  . Status post total right knee replacement 12/07/2017  . Unilateral  primary osteoarthritis, right knee 11/05/2017  . Primary osteoarthritis of both knees 05/28/2017  . S/P foot surgery, right 12/05/2016  . Pain in right foot 12/05/2016  . Pain from implanted hardware 12/05/2016  . Bilateral carotid bruits 07/08/2014  . Encounter for therapeutic drug monitoring 11/13/2013  . Claudication of left lower extremity ([place]) 12/02/2012  . Neck pain 09/16/2012  . Cervical spondylosis without myelopathy 09/10/2012  . Hx of adenomatous colonic polyps 05/07/2012  . Rectal bleeding 05/07/2012  . GERD (gastroesophageal reflux disease) 05/07/2012  . Long term current use of anticoagulant 01/25/2011  . HYPERLIPIDEMIA 11/21/2010  . DYSPNEA 10/20/2010  . MORBID OBESITY 09/01/2009  . Atrial fibrillation ([place]) 09/01/2009  . Essential hypertension 06/14/2009  . ESOPHAGEAL STRICTURE 06/14/2009  . DIZZINESS 06/14/2009   Brent Mason, [place]; [place]  Brent Mason 01/23/2018, 4:32 PM  [place] [address] [place], [place], [address] Phone: [phone]   Fax:  [phone]  Name: Brent Mason MRN: [id number] Date of Birth: 06/04/1946

## 2018-01-25 ENCOUNTER — Ambulatory Visit (HOSPITAL_COMMUNITY): Payer: Medicare Other

## 2018-01-25 ENCOUNTER — Encounter (HOSPITAL_COMMUNITY): Payer: Self-pay

## 2018-01-25 DIAGNOSIS — M25661 Stiffness of right knee, not elsewhere classified: Secondary | ICD-10-CM

## 2018-01-25 DIAGNOSIS — M25561 Pain in right knee: Secondary | ICD-10-CM | POA: Diagnosis not present

## 2018-01-25 DIAGNOSIS — R2689 Other abnormalities of gait and mobility: Secondary | ICD-10-CM

## 2018-01-25 DIAGNOSIS — R600 Localized edema: Secondary | ICD-10-CM

## 2018-01-25 NOTE — Therapy (Signed)
[place], [place], [address] Phone: [phone]   Fax:  [phone]  Physical Therapy Treatment  Patient Details  Name: Brent Mason MRN: [id number] Date of Birth: 05/20/1946 Referring Provider: Forrestine Him, MD   Encounter Date: 01/25/2018  PT End of Session - 01/25/18 1600    Visit Number  10    Number of Visits  19    Date for PT Re-Evaluation  02/11/18 minireassess completed on 01/23/18, visit #9    Authorization Type  [employer] [facility] Medicare    Authorization Time Period  12/31/17 - 02/11/18 (reassessment complete on 4/10)    Authorization - Visit Number  10    Authorization - Number of Visits  19    PT Start Time  1522    PT Stop Time  1600    PT Time Calculation (min)  38 min    Activity Tolerance  Patient tolerated treatment well;No increased pain    Behavior During Therapy  WFL for tasks assessed/performed       Past Medical History:  Diagnosis Date  . Anemia   . Anticoagulated on Coumadin    long-term  . Chronic kidney disease    frequent uti's last uti few yrs ago  . Diastolic CHF, chronic ([place])   . Dysrhythmia   . GERD (gastroesophageal reflux disease)   . Headache   . Hiatal hernia   . History of adenomatous polyp of colon   . History of esophageal stricture    s/p  dilatations  . History of GI bleed    03/ 2003  per EGD gastritis  . HOH (hard of hearing)    both ears  . Hypertension   . Non-rheumatic aortic stenosis    per last echo 12-14-2015    . OA (osteoarthritis)    oa  . Persistent atrial fibrillation [facility]) first dx  1999   primary cardiologist-  dr taylor-- hx DCCV  . Pneumonia 2009   with cpap usage  . Shortness of breath    when exertion  . Sleep apnea    last sleep study > 5 yrs   no cpap used    Past Surgical History:  Procedure Laterality Date  . ABDOMINAL HERNIA REPAIR  1990s  . ANTERIOR CERVICAL DECOMP/DISCECTOMY FUSION N/A 02/14/2013    Procedure: ANTERIOR CERVICAL DECOMPRESSION/DISCECTOMY FUSION 2 LEVELS;  Surgeon: Charlie Pitter, MD;  Location: [place] [facility];  Service: Neurosurgery;  Laterality: N/A;  . BALLOON DILATION N/A 12/11/2017   Procedure: BALLOON DILATION;  Surgeon: Clarene Essex, MD;  [facility];  Service: Endoscopy;  Laterality: N/A;  . COLONOSCOPY  last one 06-03-2012  . COLONOSCOPY  06/03/2012   Procedure: COLONOSCOPY;  Surgeon: Daneil Dolin, MD;  [facility];  Service: Endoscopy;  Laterality: N/A;  8:30  . esophageal strecching  yrs ago  . ESOPHAGOGASTRODUODENOSCOPY (EGD) WITH PROPOFOL N/A 12/11/2017   Procedure: ESOPHAGOGASTRODUODENOSCOPY (EGD) WITH PROPOFOL;  Surgeon: Clarene Essex, MD;  [facility];  Service: Endoscopy;  Laterality: N/A;  . ESOPHAGUS SURGERY  20 yrs ago   for food sticking  . FOOT SURGERY Right 04-27-2010   dr duda   joint arthrodesis  . right foot surgery  2018   to remove screw  . TOTAL KNEE ARTHROPLASTY Right 12/07/2017   Procedure: RIGHT TOTAL KNEE ARTHROPLASTY;  Surgeon: Mcarthur Rossetti, MD;  [facility];  Service: Orthopedics;  Laterality: Right;  . TRANSTHORACIC ECHOCARDIOGRAM  12-14-2015  dr taylor   moderate LVH, ef 55-60%, indeterminate diastolic function in setting of AFib/  moderate LAE and RAE/  mild to moderate calcific aoric stenosis with mild AR (valve area 1.33cm^2, peak grandient17mHg, mean grandient 172mg)/ trivial MR/ mild TR/ PASP 331m  . VOLVULUS REDUCTION  [id number] There were no vitals filed for this visit.  Subjective Assessment - 01/25/18 1524    Subjective  Pt stated he moved his yard for the first time this year, got a little sun burn on knee.  Pain scale 3/10 currently sore pain.      Patient Stated Goals  get back to normal self and be able to drive to breakfast with my friends     Currently in Pain?  Yes    Pain Score  3     Pain Location  Knee                       [facility] Adult PT  Treatment/Exercise - 01/25/18 0001      Knee/Hip Exercises: Stretches   Passive Hamstring Stretch  3 reps;30 seconds;Limitations    Passive Hamstring Stretch Limitations  on 12 inch step; AP pressure on femur for increased stretch    Knee: Self-Stretch to increase Flexion  Right;10 seconds    Knee: Self-Stretch Limitations  knee drive on [id number]    Gastroc Stretch  30 seconds;3 reps;Both;Limitations    Gastroc Stretch Limitations  slant board      Knee/Hip Exercises: Standing   Heel Raises  2 sets;10 reps    Terminal Knee Extension  Right;Theraband;2 sets;10 reps    Theraband Level (Terminal Knee Extension)  Level 3 (Green)    SLS  Lt 3", Rt 2" max of 5    Other Standing Knee Exercises  tandem stance on level surface 2x 20"      Knee/Hip Exercises: Seated   Long Arc Quad  15 reps    Long Arc Quad Limitations  5" holds, cueing for full range      Knee/Hip Exercises: Supine   Quad Sets  AROM;Right;10 reps;Limitations;2 sets    Qua[employer] Corporationmitations  5" holds    Short Arc [employer]5 reps    Short Arc Quad Sets Limitations  over bolster    Heel Slides  10 reps;Right;AROM;2 sets    Knee Extension  PROM;3 sets    Knee Extension Limitations  PROM per pt tolerance 30" holds    Other Supine Knee/Hip Exercises  prolonged stretch with 8# proximal knee and ankle proped on bolster x 3'               PT Short Term Goals - 01/23/18 1530      PT SHORT TERM GOAL #1   Title  Patient will be independent with HEP to reduce edema, and improve functional strength to return to PLOF and improve gait/mobility mechanics.     Baseline  01/23/18:  Reports complaince 2x/ daily though unable to recall    Status  Partially Met      PT SHORT TERM GOAL #2   Title  Patient will improve MMT by 1/2 grade for limited muscle groups to improve muscle endurance and gait/mobility performance.    Baseline  See MMT    Status  Partially Met      PT SHORT TERM GOAL #3   Title  Patient will improve ROM  for right knee extension/flexion by 10 degrees both directions  to improve functional gait/mobility to improve stair ambulation to enter home an increase community access.    Baseline  4/10: AROM 14-110 degrees    Status  On-going      PT SHORT TERM GOAL #4   Title  Patient will reduce edema by of Rt knee to be no more than 1/2 an inch larger than the Lt knee to improve joint ROM, decrease pain, and improve muscle activation for greater functional strength.    Baseline  4/10: No improvements with edema measurements.  Discussed benefits with compression hose to assist with swelling, pt not interested in hose to assist with edema.    Status  Not Met        PT Long Term Goals - 01/23/18 1539      PT LONG TERM GOAL #1   Title  Patient will improve MMT by 1 grade for limited muscle groups to improve muscle endurance and gait/mobility performance.    Status  On-going      PT LONG TERM GOAL #2   Title  Patient will perform 3MWT with LRAD and gait velocity of 1.0 m/s to indicate he is at decreased risk of falling and fall into the community ambulator category to retrn to PLOF with community acitvities with friends.     Baseline  4/10: 3MWT no AD and gait velocity .86 m/s    Status  On-going      PT LONG TERM GOAL #3   Title  Patient with improve ROM for Rt knee for 0-110 degrees for more functional gait and improved ability to ascend/descend stairs.    Baseline  4/10: AROM 14-110 degrees     Status  Partially Met            Plan - 01/25/18 1718    Clinical Impression Statement  Extensive education/discussion held about importance of adherence to HEP.  Pt reports he complete 5-10 minutes 2x daily though unable to recall exercises he is doing at home.  Pt continues to have ~14-18 degrees extension lag altering his gait mechanics.  Added prolonged stretches with 8# proximal knee and heel supported and gentle PROM to assist with knee extension.  Pt explained importance of quad strengthening to  assist.  Added balance activities this session to improve stability with max SLS at 2-3" BLE.  Pt given additional SLS to improve balance for fall prevention, encouraged to complete daily and increased adherence for ROM based exercises.    Rehab Potential  Fair    Clinical Impairments Affecting Rehab Potential  (+) motivation, (+) support system, (-) financial burden, (-) discipline/compliance with HEP    PT Frequency  3x / week    PT Duration  6 weeks    PT Treatment/Interventions  ADLs/Self Care Home Management;Aquatic Therapy;Cryotherapy;Electrical Stimulation;DME Instruction;Gait training;Stair training;Functional mobility training;Therapeutic activities;Therapeutic exercise;Balance training;Neuromuscular re-education;Cognitive remediation;Patient/family education;Manual techniques;Scar mobilization;Passive range of motion;Energy conservation;Taping    PT Next Visit Plan  Continue focus with ROM extension > flexion.  Continue with manual for edema and education on scar massage. Continue with bike for ROM. If edema continues patient may benefit from compression garment.    PT Home Exercise Plan  Eval: quad set; heels slides, SAQ, LAQ, HS stretch; 4/12; SLS and tandem stance by counter        Patient will benefit from skilled therapeutic intervention in order to improve the following deficits and impairments:  Abnormal gait, Decreased balance, Decreased endurance, Decreased mobility, Decreased skin integrity, Difficulty walking, Hypomobility, Decreased range of motion,  Decreased scar mobility, Increased edema, Improper body mechanics, Obesity, Decreased activity tolerance, Decreased knowledge of use of DME, Decreased safety awareness, Decreased strength, Impaired flexibility, Pain  Visit Diagnosis: Acute pain of right knee  Stiffness of right knee, not elsewhere classified  Localized edema  Other abnormalities of gait and mobility     Problem List Patient Active Problem List   Diagnosis  Date Noted  . Status post total right knee replacement 12/07/2017  . Unilateral primary osteoarthritis, right knee 11/05/2017  . Primary osteoarthritis of both knees 05/28/2017  . S/P foot surgery, right 12/05/2016  . Pain in right foot 12/05/2016  . Pain from implanted hardware 12/05/2016  . Bilateral carotid bruits 07/08/2014  . Encounter for therapeutic drug monitoring 11/13/2013  . Claudication of left lower extremity ([place]) 12/02/2012  . Neck pain 09/16/2012  . Cervical spondylosis without myelopathy 09/10/2012  . Hx of adenomatous colonic polyps 05/07/2012  . Rectal bleeding 05/07/2012  . GERD (gastroesophageal reflux disease) 05/07/2012  . Long term current use of anticoagulant 01/25/2011  . HYPERLIPIDEMIA 11/21/2010  . DYSPNEA 10/20/2010  . MORBID OBESITY 09/01/2009  . Atrial fibrillation ([place]) 09/01/2009  . Essential hypertension 06/14/2009  . ESOPHAGEAL STRICTURE 06/14/2009  . DIZZINESS 06/14/2009   Ihor Austin, [place]; [place]  Aldona Lento 01/25/2018, 5:23 PM  [place] [address] [place], [place], [address] Phone: [phone]   Fax:  [phone]  Name: Brent Mason MRN: [id number] Date of Birth: 1946/10/11

## 2018-01-25 NOTE — Patient Instructions (Signed)
SINGLE LIMB STANCE    Stance: single leg on floor. Raise leg. Hold 30 seconds. Repeat with other leg. 5 reps per set, 1-2 sets per day, 6 days per week  Copyright  VHI. All rights reserved.   Tandem Stance    Right foot in front of left, heel touching toe both feet "straight ahead". Stand on Foot Triangle of Support with both feet. Balance in this position 20 seconds. Do with left foot in front of right.  Copyright  VHI. All rights reserved.

## 2018-01-28 ENCOUNTER — Encounter (HOSPITAL_COMMUNITY): Payer: Self-pay

## 2018-01-28 ENCOUNTER — Other Ambulatory Visit: Payer: Self-pay

## 2018-01-28 ENCOUNTER — Ambulatory Visit (HOSPITAL_COMMUNITY): Payer: Medicare Other

## 2018-01-28 DIAGNOSIS — M25561 Pain in right knee: Secondary | ICD-10-CM | POA: Diagnosis not present

## 2018-01-28 DIAGNOSIS — R2689 Other abnormalities of gait and mobility: Secondary | ICD-10-CM

## 2018-01-28 DIAGNOSIS — M25661 Stiffness of right knee, not elsewhere classified: Secondary | ICD-10-CM

## 2018-01-28 DIAGNOSIS — R600 Localized edema: Secondary | ICD-10-CM

## 2018-01-28 NOTE — Therapy (Signed)
[place], [place], [address] Phone: [phone]   Fax:  [phone]  Physical Therapy Treatment  Patient Details  Name: Brent Mason MRN: [id number] Date of Birth: 19-Feb-1946 Referring Provider: Forrestine Him, MD   Encounter Date: 01/28/2018  PT End of Session - 01/28/18 1703    Visit Number  11    Number of Visits  19    Date for PT Re-Evaluation  02/11/18 minireassess completed on 01/23/18, visit #9    Authorization Type  [employer] [facility] Medicare    Authorization Time Period  12/31/17 - 02/11/18 (reassessment complete on 4/10)    Authorization - Visit Number  11    Authorization - Number of Visits  19    PT Start Time  9528    PT Stop Time  1729    PT Time Calculation (min)  39 min    Activity Tolerance  Patient tolerated treatment well;No increased pain    Behavior During Therapy  WFL for tasks assessed/performed       Past Medical History:  Diagnosis Date  . Anemia   . Anticoagulated on Coumadin    long-term  . Chronic kidney disease    frequent uti's last uti few yrs ago  . Diastolic CHF, chronic ([place])   . Dysrhythmia   . GERD (gastroesophageal reflux disease)   . Headache   . Hiatal hernia   . History of adenomatous polyp of colon   . History of esophageal stricture    s/p  dilatations  . History of GI bleed    03/ 2003  per EGD gastritis  . HOH (hard of hearing)    both ears  . Hypertension   . Non-rheumatic aortic stenosis    per last echo 12-14-2015    . OA (osteoarthritis)    oa  . Persistent atrial fibrillation [facility]) first dx  1999   primary cardiologist-  dr taylor-- hx DCCV  . Pneumonia 2009   with cpap usage  . Shortness of breath    when exertion  . Sleep apnea    last sleep study > 5 yrs   no cpap used    Past Surgical History:  Procedure Laterality Date  . ABDOMINAL HERNIA REPAIR  1990s  . ANTERIOR CERVICAL DECOMP/DISCECTOMY FUSION N/A 02/14/2013   Procedure: ANTERIOR CERVICAL DECOMPRESSION/DISCECTOMY FUSION 2 LEVELS;  Surgeon: Charlie Pitter, MD;  Location: [place] [facility];  Service: Neurosurgery;  Laterality: N/A;  . BALLOON DILATION N/A 12/11/2017   Procedure: BALLOON DILATION;  Surgeon: Clarene Essex, MD;  [facility];  Service: Endoscopy;  Laterality: N/A;  . COLONOSCOPY  last one 06-03-2012  . COLONOSCOPY  06/03/2012   Procedure: COLONOSCOPY;  Surgeon: Daneil Dolin, MD;  [facility];  Service: Endoscopy;  Laterality: N/A;  8:30  . esophageal strecching  yrs ago  . ESOPHAGOGASTRODUODENOSCOPY (EGD) WITH PROPOFOL N/A 12/11/2017   Procedure: ESOPHAGOGASTRODUODENOSCOPY (EGD) WITH PROPOFOL;  Surgeon: Clarene Essex, MD;  [facility];  Service: Endoscopy;  Laterality: N/A;  . ESOPHAGUS SURGERY  20 yrs ago   for food sticking  . FOOT SURGERY Right 04-27-2010   dr duda   joint arthrodesis  . right foot surgery  2018   to remove screw  . TOTAL KNEE ARTHROPLASTY Right 12/07/2017   Procedure: RIGHT TOTAL KNEE ARTHROPLASTY;  Surgeon: Mcarthur Rossetti, MD;  [facility];  Service: Orthopedics;  Laterality: Right;  . TRANSTHORACIC ECHOCARDIOGRAM  12-14-2015  dr taylor   moderate LVH, ef 55-60%, indeterminate diastolic function in setting of AFib/  moderate LAE and RAE/  mild to moderate calcific aoric stenosis with mild AR (valve area 1.33cm^2, peak grandient46mHg, mean grandient 183mg)/ trivial MR/ mild TR/ PASP 3354m  . VOLVULUS REDUCTION  [id number] There were no vitals filed for this visit.  Subjective Assessment - 01/28/18 1656    Subjective  Patient reports he is well today but has a lot going on right now. He is taking care of his wife now since she fell last week and also has been able to mow his lawn and drive some.     Limitations  Sitting;Standing;Walking;House hold activities    Patient Stated Goals  get back to normal self and be able to drive to breakfast with my friends     Currently in  Pain?  Yes    Pain Score  2     Pain Location  Knee    Pain Orientation  Right    Pain Descriptors / Indicators  Aching;Tightness    Pain Type  Surgical pain;Chronic pain    Pain Onset  More than a month ago    Pain Frequency  Constant    Aggravating Factors   moveing knee     Pain Relieving Factors  medicine    Effect of Pain on Daily Activities  difficulty with walking and activities like mowing the lawn        [facility] - 01/28/18 0001      Knee/Hip Exercises: Stretches   Passive Hamstring Stretch  3 reps;30 seconds;Limitations    Passive Hamstring Stretch Limitations  on 12 inch step; AP pressure on femur for increased stretch    Knee: Self-Stretch to increase Flexion  Right;10 seconds    Knee: Self-Stretch Limitations  knee drive on [id number]      Knee/Hip Exercises: Standing   Heel Raises  Both;2 sets;20 reps    Terminal Knee Extension  Right;Theraband;2 sets;15 reps    Theraband Level (Terminal Knee Extension)  Level 4 (Blue)      Knee/Hip Exercises: Seated   Long Arc Quad  15 reps    Long Arc Quad Limitations  5" holds, cueing for full range      Knee/Hip Exercises: Supine   Quad Sets  AROM;Right;10 reps;Limitations;2 sets    Quad Sets Limitations  5 sec holds    Other Supine Knee/Hip Exercises  prolonged stretch with 8# proximal knee and ankle proped on bolster x 3'        Balance Exercises - 01/28/18 1719      Balance Exercises: Standing   Tandem Stance  Eyes open;4 reps;10 secs;15 secs 4 reps alternating foot alignment    SLS  Eyes open;Solid surface;Intermittent upper extremity support;2 reps;10 secs 2 reps Bil LE (4 total)    Standing, One Foot on a Step  Eyes open;2 inch;2 reps;15 secs 2 reps bil LE (4 total)        PT Education - 01/28/18 1702    Education provided  Yes    Education Details  educated on exercise throughout and reviewed stretchign for HEP.    Person(s) Educated  Patient    Methods  Explanation     Comprehension  Verbalized understanding;Need further instruction       PT Short Term Goals - 01/23/18 1530      PT SHORT TERM GOAL #1   Title  Patient will be independent  with HEP to reduce edema, and improve functional strength to return to PLOF and improve gait/mobility mechanics.     Baseline  01/23/18:  Reports complaince 2x/ daily though unable to recall    Status  Partially Met      PT SHORT TERM GOAL #2   Title  Patient will improve MMT by 1/2 grade for limited muscle groups to improve muscle endurance and gait/mobility performance.    Baseline  See MMT    Status  Partially Met      PT SHORT TERM GOAL #3   Title  Patient will improve ROM for right knee extension/flexion by 10 degrees both directions to improve functional gait/mobility to improve stair ambulation to enter home an increase community access.    Baseline  4/10: AROM 14-110 degrees    Status  On-going      PT SHORT TERM GOAL #4   Title  Patient will reduce edema by of Rt knee to be no more than 1/2 an inch larger than the Lt knee to improve joint ROM, decrease pain, and improve muscle activation for greater functional strength.    Baseline  4/10: No improvements with edema measurements.  Discussed benefits with compression hose to assist with swelling, pt not interested in hose to assist with edema.    Status  Not Met        PT Long Term Goals - 01/23/18 1539      PT LONG TERM GOAL #1   Title  Patient will improve MMT by 1 grade for limited muscle groups to improve muscle endurance and gait/mobility performance.    Status  On-going      PT LONG TERM GOAL #2   Title  Patient will perform 3MWT with LRAD and gait velocity of 1.0 m/s to indicate he is at decreased risk of falling and fall into the community ambulator category to retrn to PLOF with community acitvities with friends.     Baseline  4/10: 3MWT no AD and gait velocity .86 m/s    Status  On-going      PT LONG TERM GOAL #3   Title  Patient with improve  ROM for Rt knee for 0-110 degrees for more functional gait and improved ability to ascend/descend stairs.    Baseline  4/10: AROM 14-110 degrees     Status  Partially Met        Plan - 01/28/18 1703    Clinical Impression Statement  Today's session continued to focus on ROM exercises and stretching. He continues to require verbal/tactile cues to achieve proper form and quad activation throughout exercises and requires ongoing reminders to participate in HEP. Education provided on importance of proper form, full activation/stretch into end range, and HEP participation to improve in functional mobility. Continued with prolonged stretches with 8# proximal knee and heel supported for stretch to assist with knee extension. He will continue to benefit from skilled PT services to address impairments and continue to educate on HEP to improve independence with strengthening and mobility.    Rehab Potential  Fair    Clinical Impairments Affecting Rehab Potential  (+) motivation, (+) support system, (-) financial burden, (-) discipline/compliance with HEP    PT Frequency  3x / week    PT Duration  6 weeks    PT Treatment/Interventions  ADLs/Self Care Home Management;Aquatic Therapy;Cryotherapy;Electrical Stimulation;DME Instruction;Gait training;Stair training;Functional mobility training;Therapeutic activities;Therapeutic exercise;Balance training;Neuromuscular re-education;Cognitive remediation;Patient/family education;Manual techniques;Scar mobilization;Passive range of motion;Energy conservation;Taping    PT Next Visit Plan  Continue focus with ROM extension > flexion. Continue with prolonged stretching. Continue with manual for edema and education on scar massage. Continue with bike for ROM.    PT Home Exercise Plan  Eval: quad set; heels slides, SAQ, LAQ, HS stretch; 4/12; SLS and tandem stance by counter     Consulted and Agree with Plan of Care  Patient       Patient will benefit from skilled  therapeutic intervention in order to improve the following deficits and impairments:  Abnormal gait, Decreased balance, Decreased endurance, Decreased mobility, Decreased skin integrity, Difficulty walking, Hypomobility, Decreased range of motion, Decreased scar mobility, Increased edema, Improper body mechanics, Obesity, Decreased activity tolerance, Decreased knowledge of use of DME, Decreased safety awareness, Decreased strength, Impaired flexibility, Pain  Visit Diagnosis: Acute pain of right knee  Stiffness of right knee, not elsewhere classified  Localized edema  Other abnormalities of gait and mobility     Problem List Patient Active Problem List   Diagnosis Date Noted  . Status post total right knee replacement 12/07/2017  . Unilateral primary osteoarthritis, right knee 11/05/2017  . Primary osteoarthritis of both knees 05/28/2017  . S/P foot surgery, right 12/05/2016  . Pain in right foot 12/05/2016  . Pain from implanted hardware 12/05/2016  . Bilateral carotid bruits 07/08/2014  . Encounter for therapeutic drug monitoring 11/13/2013  . Claudication of left lower extremity ([place]) 12/02/2012  . Neck pain 09/16/2012  . Cervical spondylosis without myelopathy 09/10/2012  . Hx of adenomatous colonic polyps 05/07/2012  . Rectal bleeding 05/07/2012  . GERD (gastroesophageal reflux disease) 05/07/2012  . Long term current use of anticoagulant 01/25/2011  . HYPERLIPIDEMIA 11/21/2010  . DYSPNEA 10/20/2010  . MORBID OBESITY 09/01/2009  . Atrial fibrillation ([place]) 09/01/2009  . Essential hypertension 06/14/2009  . ESOPHAGEAL STRICTURE 06/14/2009  . DIZZINESS 06/14/2009    Kipp Brood, PT, DPT Physical Therapist with [facility]  01/28/2018 5:26 PM    [place] [address] [place], [place], [address] Phone: [phone]   Fax:  [phone]  Name: DAELEN BELVEDERE MRN: [id number] Date of Birth:  Aug 28, 1946

## 2018-01-30 ENCOUNTER — Encounter (HOSPITAL_COMMUNITY): Payer: Self-pay

## 2018-01-30 ENCOUNTER — Ambulatory Visit (HOSPITAL_COMMUNITY): Payer: Medicare Other

## 2018-01-30 DIAGNOSIS — R600 Localized edema: Secondary | ICD-10-CM

## 2018-01-30 DIAGNOSIS — M25561 Pain in right knee: Secondary | ICD-10-CM

## 2018-01-30 DIAGNOSIS — R2689 Other abnormalities of gait and mobility: Secondary | ICD-10-CM

## 2018-01-30 DIAGNOSIS — M25661 Stiffness of right knee, not elsewhere classified: Secondary | ICD-10-CM

## 2018-01-30 NOTE — Therapy (Signed)
[place], [place], [address] Phone: [phone]   Fax:  [phone]  Physical Therapy Treatment  Patient Details  Name: Brent Mason MRN: [id number] Date of Birth: [birth year] Referring Provider: Forrestine Him, MD   Encounter Date: 01/30/2018  PT End of Session - 01/30/18 1523    Visit Number  12    Number of Visits  19    Date for PT Re-Evaluation  02/11/18 minireassess completed on 01/23/18, visit #9    Authorization Type  [employer] [facility] Medicare    Authorization Time Period  12/31/17 - 02/11/18 (reassessment complete on 4/10)    Authorization - Visit Number  12    Authorization - Number of Visits  19    PT Start Time  1519    PT Stop Time  1602    PT Time Calculation (min)  43 min    Activity Tolerance  Patient tolerated treatment well;No increased pain    Behavior During Therapy  WFL for tasks assessed/performed       Past Medical History:  Diagnosis Date  . Anemia   . Anticoagulated on Coumadin    long-term  . Chronic kidney disease    frequent uti's last uti few yrs ago  . Diastolic CHF, chronic ([place])   . Dysrhythmia   . GERD (gastroesophageal reflux disease)   . Headache   . Hiatal hernia   . History of adenomatous polyp of colon   . History of esophageal stricture    s/p  dilatations  . History of GI bleed    03/ 2003  per EGD gastritis  . HOH (hard of hearing)    both ears  . Hypertension   . Non-rheumatic aortic stenosis    per last echo 12-14-2015    . OA (osteoarthritis)    oa  . Persistent atrial fibrillation [facility]) first dx  1999   primary cardiologist-  dr taylor-- hx DCCV  . Pneumonia 2009   with cpap usage  . Shortness of breath    when exertion  . Sleep apnea    last sleep study > 5 yrs   no cpap used    Past Surgical History:  Procedure Laterality Date  . ABDOMINAL HERNIA REPAIR  1990s  . ANTERIOR CERVICAL DECOMP/DISCECTOMY FUSION N/A 02/14/2013   Procedure: ANTERIOR CERVICAL DECOMPRESSION/DISCECTOMY FUSION 2 LEVELS;  Surgeon: Charlie Pitter, MD;  Location: [place] [facility]URO ORS;  Service: Neurosurgery;  Laterality: N/A;  . BALLOON DILATION N/A 12/11/2017   Procedure: BALLOON DILATION;  Surgeon: Clarene Essex, MD;  [facility];  Service: Endoscopy;  Laterality: N/A;  . COLONOSCOPY  last one 06-03-2012  . COLONOSCOPY  06/03/2012   Procedure: COLONOSCOPY;  Surgeon: Daneil Dolin, MD;  [facility];  Service: Endoscopy;  Laterality: N/A;  8:30  . esophageal strecching  yrs ago  . ESOPHAGOGASTRODUODENOSCOPY (EGD) WITH PROPOFOL N/A 12/11/2017   Procedure: ESOPHAGOGASTRODUODENOSCOPY (EGD) WITH PROPOFOL;  Surgeon: Clarene Essex, MD;  [facility];  Service: Endoscopy;  Laterality: N/A;  . ESOPHAGUS SURGERY  20 yrs ago   for food sticking  . FOOT SURGERY Right 04-27-2010   dr duda   joint arthrodesis  . right foot surgery  2018   to remove screw  . TOTAL KNEE ARTHROPLASTY Right 12/07/2017   Procedure: RIGHT TOTAL KNEE ARTHROPLASTY;  Surgeon: Mcarthur Rossetti, MD;  [facility];  Service: Orthopedics;  Laterality: Right;  . TRANSTHORACIC ECHOCARDIOGRAM  12-14-2015  dr taylor   moderate LVH, ef 55-60%, indeterminate diastolic function in setting of AFib/  moderate LAE and RAE/  mild to moderate calcific aoric stenosis with mild AR (valve area 1.33cm^2, peak grandient34mHg, mean grandient 164mg)/ trivial MR/ mild TR/ PASP 3355m  . VOLVULUS REDUCTION  [id number] There were no vitals filed for this visit.  Subjective Assessment - 01/30/18 1522    Subjective  Pt reports he's alright today. Rates his knee pain as 2/10.     Limitations  Sitting;Standing;Walking;House hold activities    Patient Stated Goals  get back to normal self and be able to drive to breakfast with my friends     Currently in Pain?  Yes    Pain Score  2     Pain Location  Knee    Pain Orientation  Right    Pain Descriptors / Indicators   Aching;Tightness    Pain Type  Surgical pain    Pain Onset  More than a month ago    Pain Frequency  Constant    Aggravating Factors   moving knee    Pain Relieving Factors  medicine    Effect of Pain on Daily Activities  difficulty with walking and activities like mowing the lawn           [facility]/Exercise - 01/30/18 0001      Knee/Hip Exercises: Stretches   Passive Hamstring Stretch  3 reps;30 seconds;Limitations    Passive Hamstring Stretch Limitations  on 12 inch step; AP pressure on femur for increased stretch    Knee: Self-Stretch to increase Flexion  Right;10 seconds    Knee: Self-Stretch Limitations  10x10"; knee drive on [id number]    Gastroc Stretch  30 seconds;3 reps;Both;Limitations    Gastroc Stretch Limitations  slant board      Knee/Hip Exercises: Machines for Strengthening   Other Machine  Biodex PROM: 83% extension      Knee/Hip Exercises: Standing   Terminal Knee Extension  Right;15 reps;Theraband    Theraband Level (Terminal Knee Extension)  Level 4 (Blue)    Terminal Knee Extension Limitations  10" holds    Lateral Step Up  Right;15 reps;Step Height: 4";Limitations    Lateral Step Up Limitations  cues for TKE at top    Gait Training  retro walking for increaed quad engagement/knee extension [id number]      Knee/Hip Exercises: Seated   Long Arc Quad  Right;15 reps;Weights;Limitations    Long Arc Quad Weight  3 lbs.    Long Arc [employer] Corporationitations  3-5" holds      Knee/Hip Exercises: Supine   Short Arc QuadTarget Corporationght;15 reps    Short Arc QuadTarget Corporationitations  over large bolster; 3#, 3-5" holds      Manual Therapy   Manual Therapy  Joint mobilization    Manual therapy comments  All manual therapy was completed separately from all other skilled interventions    Joint Mobilization  Grade IV PA tibiofemoral and superior patellar mobs            PT Short Term Goals - 01/23/18 1530      PT SHORT TERM GOAL #1   Title  Patient will be  independent with HEP to reduce edema, and improve functional strength to return to PLOF and improve gait/mobility mechanics.     Baseline  01/23/18:  Reports complaince 2x/ daily though unable to recall    Status  Partially  Met      PT SHORT TERM GOAL #2   Title  Patient will improve MMT by 1/2 grade for limited muscle groups to improve muscle endurance and gait/mobility performance.    Baseline  See MMT    Status  Partially Met      PT SHORT TERM GOAL #3   Title  Patient will improve ROM for right knee extension/flexion by 10 degrees both directions to improve functional gait/mobility to improve stair ambulation to enter home an increase community access.    Baseline  4/10: AROM 14-110 degrees    Status  On-going      PT SHORT TERM GOAL #4   Title  Patient will reduce edema by of Rt knee to be no more than 1/2 an inch larger than the Lt knee to improve joint ROM, decrease pain, and improve muscle activation for greater functional strength.    Baseline  4/10: No improvements with edema measurements.  Discussed benefits with compression hose to assist with swelling, pt not interested in hose to assist with edema.    Status  Not Met        PT Long Term Goals - 01/23/18 1539      PT LONG TERM GOAL #1   Title  Patient will improve MMT by 1 grade for limited muscle groups to improve muscle endurance and gait/mobility performance.    Status  On-going      PT LONG TERM GOAL #2   Title  Patient will perform 3MWT with LRAD and gait velocity of 1.0 m/s to indicate he is at decreased risk of falling and fall into the community ambulator category to retrn to PLOF with community acitvities with friends.     Baseline  4/10: 3MWT no AD and gait velocity .86 m/s    Status  On-going      PT LONG TERM GOAL #3   Title  Patient with improve ROM for Rt knee for 0-110 degrees for more functional gait and improved ability to ascend/descend stairs.    Baseline  4/10: AROM 14-110 degrees     Status   Partially Met            Plan - 01/30/18 1607    Clinical Impression Statement  Continued with established POC focusing on improving overall knee mobility. PT added lateral step ups for quad strengthening, retro ambulation and PROM on the Biodex to address overall deficits in knee extension. He tolerated all well, not reporting any pain during session and only requiring min cues for proper exercise technique. Attempted to add prone TKE and prone quad stretch but pt stating he is unable to lay on his stomach d/t a surgery.  Pt's AROM 16 to 120deg this date. Pt stating that Friday will be his last appointment and states that he wants to be d/c because he is happy with his current level of function.    Rehab Potential  Fair    Clinical Impairments Affecting Rehab Potential  (+) motivation, (+) support system, (-) financial burden, (-) discipline/compliance with HEP    PT Frequency  3x / week    PT Duration  6 weeks    PT Treatment/Interventions  ADLs/Self Care Home Management;Aquatic Therapy;Cryotherapy;Electrical Stimulation;DME Instruction;Gait training;Stair training;Functional mobility training;Therapeutic activities;Therapeutic exercise;Balance training;Neuromuscular re-education;Cognitive remediation;Patient/family education;Manual techniques;Scar mobilization;Passive range of motion;Energy conservation;Taping    PT Next Visit Plan  reassess and d/c per pt request    PT Home Exercise Plan  Eval: quad set; heels slides, SAQ, LAQ,  HS stretch; 4/12; SLS and tandem stance by counter     Consulted and Agree with Plan of Care  Patient       Patient will benefit from skilled therapeutic intervention in order to improve the following deficits and impairments:  Abnormal gait, Decreased balance, Decreased endurance, Decreased mobility, Decreased skin integrity, Difficulty walking, Hypomobility, Decreased range of motion, Decreased scar mobility, Increased edema, Improper body mechanics, Obesity,  Decreased activity tolerance, Decreased knowledge of use of DME, Decreased safety awareness, Decreased strength, Impaired flexibility, Pain  Visit Diagnosis: Acute pain of right knee  Stiffness of right knee, not elsewhere classified  Localized edema  Other abnormalities of gait and mobility     Problem List Patient Active Problem List   Diagnosis Date Noted  . Status post total right knee replacement 12/07/2017  . Unilateral primary osteoarthritis, right knee 11/05/2017  . Primary osteoarthritis of both knees 05/28/2017  . S/P foot surgery, right 12/05/2016  . Pain in right foot 12/05/2016  . Pain from implanted hardware 12/05/2016  . Bilateral carotid bruits 07/08/2014  . Encounter for therapeutic drug monitoring 11/13/2013  . Claudication of left lower extremity ([place]) 12/02/2012  . Neck pain 09/16/2012  . Cervical spondylosis without myelopathy 09/10/2012  . Hx of adenomatous colonic polyps 05/07/2012  . Rectal bleeding 05/07/2012  . GERD (gastroesophageal reflux disease) 05/07/2012  . Long term current use of anticoagulant 01/25/2011  . HYPERLIPIDEMIA 11/21/2010  . DYSPNEA 10/20/2010  . MORBID OBESITY 09/01/2009  . Atrial fibrillation ([place]) 09/01/2009  . Essential hypertension 06/14/2009  . ESOPHAGEAL STRICTURE 06/14/2009  . DIZZINESS 06/14/2009         Geraldine Solar PT, [place] [address] [place], [place], [address] Phone: [phone]   Fax:  [phone]  Name: Brent Mason MRN: [id number] Date of Birth: [birth year]

## 2018-02-01 ENCOUNTER — Encounter (HOSPITAL_COMMUNITY): Payer: Self-pay | Admitting: Physical Therapy

## 2018-02-01 ENCOUNTER — Ambulatory Visit (HOSPITAL_COMMUNITY): Payer: Medicare Other | Admitting: Physical Therapy

## 2018-02-01 DIAGNOSIS — R600 Localized edema: Secondary | ICD-10-CM | POA: Diagnosis not present

## 2018-02-01 DIAGNOSIS — M25561 Pain in right knee: Secondary | ICD-10-CM | POA: Diagnosis not present

## 2018-02-01 DIAGNOSIS — M25661 Stiffness of right knee, not elsewhere classified: Secondary | ICD-10-CM | POA: Diagnosis not present

## 2018-02-01 DIAGNOSIS — R2689 Other abnormalities of gait and mobility: Secondary | ICD-10-CM | POA: Diagnosis not present

## 2018-02-01 NOTE — Therapy (Addendum)
[facility] [address] [place], [place], [address] Phone: [phone]   Fax:  [phone]  Physical Therapy Treatment / Re-assessment / Discharge Summary  Patient Details  Name: Brent Mason MRN: [id number] Date of Birth: 26-Feb-1946 Referring Provider: Mcarthur Rossetti, MD   Encounter Date: 02/01/2018  PT End of Session - 02/01/18 1605    Visit Number  13    Number of Visits  19    Date for PT Re-Evaluation  02/11/18 minireassess completed on 01/23/18, visit #9    Authorization Type  [employer] [facility] Medicare    Authorization Time Period  12/31/17 - 02/11/18 (reassessment complete on 4/10)    Authorization - Visit Number  13    Authorization - Number of Visits  19    PT Start Time  8127    PT Stop Time  5170 Some time unbilled for re-assessment    PT Time Calculation (min)  40 min    Activity Tolerance  Patient tolerated treatment well;No increased pain    Behavior During Therapy  WFL for tasks assessed/performed       Past Medical History:  Diagnosis Date  . Anemia   . Anticoagulated on Coumadin    long-term  . Chronic kidney disease    frequent uti's last uti few yrs ago  . Diastolic CHF, chronic ([place])   . Dysrhythmia   . GERD (gastroesophageal reflux disease)   . Headache   . Hiatal hernia   . History of adenomatous polyp of colon   . History of esophageal stricture    s/p  dilatations  . History of GI bleed    03/ 2003  per EGD gastritis  . HOH (hard of hearing)    both ears  . Hypertension   . Non-rheumatic aortic stenosis    per last echo 12-14-2015    . OA (osteoarthritis)    oa  . Persistent atrial fibrillation [facility]) first dx  1999   primary cardiologist-  dr taylor-- hx DCCV  . Pneumonia 2009   with cpap usage  . Shortness of breath    when exertion  . Sleep apnea    last sleep study > 5 yrs   no cpap used    Past Surgical History:  Procedure Laterality Date  . ABDOMINAL HERNIA REPAIR   1990s  . ANTERIOR CERVICAL DECOMP/DISCECTOMY FUSION N/A 02/14/2013   Procedure: ANTERIOR CERVICAL DECOMPRESSION/DISCECTOMY FUSION 2 LEVELS;  Surgeon: Charlie Pitter, MD;  Location: [facility];  Service: Neurosurgery;  Laterality: N/A;  . BALLOON DILATION N/A 12/11/2017   Procedure: BALLOON DILATION;  Surgeon: Clarene Essex, MD;  [facility];  Service: Endoscopy;  Laterality: N/A;  . COLONOSCOPY  last one 06-03-2012  . COLONOSCOPY  06/03/2012   Procedure: COLONOSCOPY;  Surgeon: Daneil Dolin, MD;  [facility];  Service: Endoscopy;  Laterality: N/A;  8:30  . esophageal strecching  yrs ago  . ESOPHAGOGASTRODUODENOSCOPY (EGD) WITH PROPOFOL N/A 12/11/2017   Procedure: ESOPHAGOGASTRODUODENOSCOPY (EGD) WITH PROPOFOL;  Surgeon: Clarene Essex, MD;  [facility];  Service: Endoscopy;  Laterality: N/A;  . ESOPHAGUS SURGERY  20 yrs ago   for food sticking  . FOOT SURGERY Right 04-27-2010   dr duda   joint arthrodesis  . right foot surgery  2018   to remove screw  . TOTAL KNEE ARTHROPLASTY Right 12/07/2017   Procedure: RIGHT TOTAL KNEE ARTHROPLASTY;  Surgeon: Mcarthur Rossetti, MD;  [facility];  Service: Orthopedics;  Laterality: Right;  . TRANSTHORACIC ECHOCARDIOGRAM  12-14-2015  dr taylor   moderate LVH, ef 55-60%, indeterminate diastolic function in setting of AFib/  moderate LAE and RAE/  mild to moderate calcific aoric stenosis with mild AR (valve area 1.33cm^2, peak grandient29mHg, mean grandient 135mg)/ trivial MR/ mild TR/ PASP 3370m  . VOLVULUS REDUCTION  [id number] There were no vitals filed for this visit.  Subjective Assessment - 02/01/18 1520    Subjective  Patient stated that he is only having 1/10 pain in his knee. Patient stated that he wants today to be his last therapy session.     Limitations  Sitting;Standing;Walking;House hold activities    How long can you sit comfortably?  15-20 minutes (was several minutes but it is stiff after  prolonged sitting)    How long can you stand comfortably?  10-15 minutes (a couple minutes before it starts gettign more sore)    How long can you walk comfortably?  10-15 minutes (was a few minutes)    Patient Stated Goals  get back to normal self and be able to drive to breakfast with my friends     Currently in Pain?  Yes    Pain Score  1     Pain Location  Knee    Pain Orientation  Right    Pain Descriptors / Indicators  Aching;Tightness    Pain Type  Surgical pain    Pain Onset  More than a month ago    Aggravating Factors   Moving knee    Pain Relieving Factors  Medicine    Effect of Pain on Daily Activities  Patient reported increased ease of mowing the lawn    Multiple Pain Sites  No         [facility] PT Assessment - 02/01/18 0001      Assessment   Medical Diagnosis  Right TKA    Referring Provider  BlaMcarthur RossettiD    Onset Date/Surgical Date  12/07/17    Prior Therapy  HHPT      Prior Function   Level of Independence  Independent with community mobility with device    Vocation  Retired      CogAssociate ProfessorOverall Cognitive Status  Within Functional Limits for tasks assessed      Observation/Other Assessments   Focus on Therapeutic Outcomes (FOTO)   53% (47% limited)      Observation/Other Assessments-Edema    Edema  Circumferential      Circumferential Edema   Circumferential - Right  17 inches was 17 inches    Circumferential - Left   15.5 inches      Posture/Postural Control   Posture/Postural Control  Postural limitations    Postural Limitations  Rounded Shoulders;Forward head      AROM   Right Knee Extension  14 was 18 then 14    Right Knee Flexion  110 was 94 then 110    Left Knee Extension  4    Left Knee Flexion  128      Strength   Right Hip Flexion  4+/5 was 3+ then 4    Right Hip Extension  4/5 was 3- then 3    Right Hip ABduction  4/5 was 3+ then 4-    Left Hip Flexion  4+/5 was 3+ then 4+    Left Hip Extension  3+/5 was 3- then 3     Left Hip ABduction  4+/5 was 3+  then 4+    Right Knee Flexion  4+/5 was 4+    Right Knee Extension  4+/5 was 4+    Left Knee Flexion  5/5    Left Knee Extension  5/5    Right Ankle Dorsiflexion  5/5 was 4+    Left Ankle Dorsiflexion  5/5      Palpation   Patella mobility  hypomobile    Palpation comment  tenderness along superior/anterior Rt knee      Ambulation/Gait   Ambulation Distance (Feet)  470 Feet 3MWT    Assistive device  None    Gait Pattern  Decreased step length - left;Decreased stance time - right;Decreased stride length;Decreased hip/knee flexion - right;Decreased weight shift to right;Right flexed knee in stance;Antalgic;Wide base of support    Ambulation Surface  Level    Gait velocity  0.79 m/s    Gait Comments  Patient ambulated without assistive device with a slow pace      Static Standing Balance   Static Standing - Balance Support  No upper extremity supported    Static Standing Balance -  Activities   Single Leg Stance - Right Leg;Single Leg Stance - Left Leg    Static Standing - Comment/# of Minutes  SLS left 3 seconds, SLS right 0 seconds                   [facility] Adult PT Treatment/Exercise - 02/01/18 0001      Knee/Hip Exercises: Seated   Heel Slides  10 reps;Other (comment) 10 second holds             PT Education - 02/01/18 1604    Education provided  Yes    Education Details  Educated patient on exercises to continue at home, results of re-assessment and how patient would continue to benefit from therapy.     Person(s) Educated  Patient    Methods  Explanation    Comprehension  Verbalized understanding;Returned demonstration       PT Short Term Goals - 02/01/18 1606      PT SHORT TERM GOAL #1   Title  Patient will be independent with HEP to reduce edema, and improve functional strength to return to PLOF and improve gait/mobility mechanics.     Baseline  02/01/18:  Reports complaince 2x/ daily though unable to recall     Status  Partially Met      PT SHORT TERM GOAL #2   Title  Patient will improve MMT by 1/2 grade for limited muscle groups to improve muscle endurance and gait/mobility performance.    Baseline  02/01/18: Patient achieved this in some, but not all tested musculature. See objective measurements.     Status  Partially Met      PT SHORT TERM GOAL #3   Title  Patient will improve ROM for right knee extension/flexion by 10 degrees both directions to improve functional gait/mobility to improve stair ambulation to enter home an increase community access.    Baseline  02/01/18: Patient's right knee AROM 14 extension 110 degrees flexion, was 18 and 94 so improved by 10 degrees with flexion, but not extension.     Status  Partially Met      PT SHORT TERM GOAL #4   Title  Patient will reduce edema by of Rt knee to be no more than 1/2 an inch larger than the Lt knee to improve joint ROM, decrease pain, and improve muscle activation for greater functional strength.  Baseline  02/01/18: No improvements with edema measurements.  Discussed benefits with compression hose to assist with swelling, pt not interested in hose to assist with edema.    Status  Not Met        PT Long Term Goals - 02/01/18 1610      PT LONG TERM GOAL #1   Title  Patient will improve MMT by 1 grade for limited muscle groups to improve muscle endurance and gait/mobility performance.    Baseline  02/01/18: Met in some, but not all musculature tested. See objective measurements.     Status  Partially Met      PT LONG TERM GOAL #2   Title  Patient will perform 3MWT with LRAD and gait velocity of 1.0 m/s to indicate he is at decreased risk of falling and fall into the community ambulator category to retrn to PLOF with community acitvities with friends.     Baseline  02/01/18: 3MWT no AD and gait velocity 0.79 m/s    Status  On-going      PT LONG TERM GOAL #3   Title  Patient with improve ROM for Rt knee for 0-110 degrees for more  functional gait and improved ability to ascend/descend stairs.    Baseline  02/01/18: Patient's right knee AROM was 14 degrees extension to 110 degrees flexion    Status  Partially Met            Plan - 02/01/18 1606    Clinical Impression Statement  This session performed a re-assessment of patient's progress as patient reported upon entering therapy that this would be his last therapy session. Patient partially met 3 out of 4 of his short term goals. Patient partially met 2 out of 3 of his long term goals. Patient continued to demonstrate deficits in strength, balance, gait, functional mobility, and range of motion with right knee active range of motion of flexion of 110 degrees and knee extension of 14 degrees. Therapist described that patient was still limited and would benefit from physical therapy, patient reported that he wanted to be done with therapy at this time. Remainder of session therapist reviewed patient's home exercises with patient and taught patient a seated heel slide to perform at home. Patient was provided the contact information if he needed to contact the clinic about any questions or concerns. Patient is being discharged at this time due to patient's request.     Rehab Potential  Fair    Clinical Impairments Affecting Rehab Potential  (+) motivation, (+) support system, (-) financial burden, (-) discipline/compliance with HEP    PT Frequency  3x / week    PT Duration  6 weeks    PT Treatment/Interventions  ADLs/Self Care Home Management;Aquatic Therapy;Cryotherapy;Electrical Stimulation;DME Instruction;Gait training;Stair training;Functional mobility training;Therapeutic activities;Therapeutic exercise;Balance training;Neuromuscular re-education;Cognitive remediation;Patient/family education;Manual techniques;Scar mobilization;Passive range of motion;Energy conservation;Taping    PT Next Visit Plan  Patient being discharged at this time.     PT Home Exercise Plan  Eval:  quad set; heels slides, SAQ, LAQ, HS stretch; 4/12; SLS and tandem stance by counter; 02/01/18: Seated heel slide x10 with 5 second holds    Consulted and Agree with Plan of Care  Patient       Patient will benefit from skilled therapeutic intervention in order to improve the following deficits and impairments:  Abnormal gait, Decreased balance, Decreased endurance, Decreased mobility, Decreased skin integrity, Difficulty walking, Hypomobility, Decreased range of motion, Decreased scar mobility, Increased edema, Improper body mechanics, Obesity,  Decreased activity tolerance, Decreased knowledge of use of DME, Decreased safety awareness, Decreased strength, Impaired flexibility, Pain  Visit Diagnosis: Acute pain of right knee  Stiffness of right knee, not elsewhere classified  Localized edema  Other abnormalities of gait and mobility     Problem List Patient Active Problem List   Diagnosis Date Noted  . Status post total right knee replacement 12/07/2017  . Unilateral primary osteoarthritis, right knee 11/05/2017  . Primary osteoarthritis of both knees 05/28/2017  . S/P foot surgery, right 12/05/2016  . Pain in right foot 12/05/2016  . Pain from implanted hardware 12/05/2016  . Bilateral carotid bruits 07/08/2014  . Encounter for therapeutic drug monitoring 11/13/2013  . Claudication of left lower extremity ([place]) 12/02/2012  . Neck pain 09/16/2012  . Cervical spondylosis without myelopathy 09/10/2012  . Hx of adenomatous colonic polyps 05/07/2012  . Rectal bleeding 05/07/2012  . GERD (gastroesophageal reflux disease) 05/07/2012  . Long term current use of anticoagulant 01/25/2011  . HYPERLIPIDEMIA 11/21/2010  . DYSPNEA 10/20/2010  . MORBID OBESITY 09/01/2009  . Atrial fibrillation ([place]) 09/01/2009  . Essential hypertension 06/14/2009  . ESOPHAGEAL STRICTURE 06/14/2009  . DIZZINESS 06/14/2009    PHYSICAL THERAPY DISCHARGE SUMMARY  Visits from Start of Care: 13  Current  functional level related to goals / functional outcomes: See above   Remaining deficits: See above   Education / Equipment: See above. Educated patient on remaining deficits and on potential benefits of continuing therapy. Educated patient on home exercise program and discussed benefits of consistent performance.  Plan: Patient agrees to discharge.  Patient goals were not met. Patient is being discharged due to the patient's request.  ?????          Clarene Critchley PT, DPT 4:24 PM, 02/01/18 [place], [place], [address] Phone: [phone]   Fax:  [phone]  Name: Brent Mason MRN: [id number] Date of Birth: Sep 11, 1946

## 2018-02-01 NOTE — Patient Instructions (Signed)
  HEEL SLIDES - SELF ASSISTED While seated, slide your heel towards your buttock with the assist of the unaffected leg. Right leg with left leg helping.  Repeat 10 Times Hold 5 Seconds Complete 1 Set Perform 1 Time(s) a Day

## 2018-02-04 ENCOUNTER — Other Ambulatory Visit (HOSPITAL_COMMUNITY): Payer: Medicare Other

## 2018-02-04 ENCOUNTER — Encounter (HOSPITAL_COMMUNITY): Payer: Medicare Other

## 2018-02-06 ENCOUNTER — Encounter (HOSPITAL_COMMUNITY): Payer: Medicare Other

## 2018-02-08 ENCOUNTER — Encounter (HOSPITAL_COMMUNITY): Payer: Medicare Other

## 2018-02-11 ENCOUNTER — Telehealth (HOSPITAL_COMMUNITY): Payer: Self-pay

## 2018-02-11 ENCOUNTER — Encounter (HOSPITAL_COMMUNITY): Payer: Medicare Other

## 2018-02-11 NOTE — Telephone Encounter (Signed)
Daughter called requested that Brent Mason call her back about her father's medical bill. I gave [name] the number to patient accounting as well, [phone]. NF 02/11/18

## 2018-02-13 ENCOUNTER — Encounter (HOSPITAL_COMMUNITY): Payer: Medicare Other

## 2018-02-14 ENCOUNTER — Encounter: Payer: Self-pay | Admitting: Internal Medicine

## 2018-02-14 ENCOUNTER — Ambulatory Visit: Payer: Medicare Other | Admitting: Internal Medicine

## 2018-02-14 VITALS — BP 120/88 | HR 74 | Ht 74.0 in | Wt 265.0 lb

## 2018-02-14 DIAGNOSIS — I4891 Unspecified atrial fibrillation: Secondary | ICD-10-CM | POA: Diagnosis not present

## 2018-02-14 NOTE — Patient Instructions (Signed)
Medication Instructions:  Your physician recommends that you continue on your current medications as directed. Please refer to the Current Medication list given to you today.   Labwork: NONE   Testing/Procedures: NONE   Follow-Up: Your physician wants you to follow-up in: 1 Year with Dr. Taylor. You will receive a reminder letter in the mail two months in advance. If you don't receive a letter, please call our office to schedule the follow-up appointment.   Any Other Special Instructions Will Be Listed Below (If Applicable).     If you need a refill on your cardiac medications before your next appointment, please call your pharmacy.  Thank you for choosing [facility]!   

## 2018-02-14 NOTE — Progress Notes (Signed)
HPI [name] returns today for follow-up of his atrial fibrillation, hypertension, dyslipidemia, and obesity.  He continues to exercise and continues to lose weight.  He is down 20 pounds in the last 6 months.  He has been busier taking care of his sick wife and has not been eating as much.  He denies chest pain or shortness of breath.  No peripheral edema.  No palpitations.  He recently had knee replacement surgery and is healed up very nicely.  No Known Allergies   Current Outpatient Medications  Medication Sig Dispense Refill  . atorvastatin (LIPITOR) 10 MG tablet Take 10 mg by mouth daily.   10  . carvedilol (COREG) 3.125 MG tablet Take 1 tablet (3.125 mg total) by mouth 2 (two) times daily with a meal. 180 tablet 3  . ferrous sulfate 325 (65 FE) MG tablet Take 325 mg by mouth daily with breakfast.      . fluticasone (FLONASE) 50 MCG/ACT nasal spray Place 1 spray into the nose daily.     . furosemide (LASIX) 40 MG tablet Take 1 tablet (40 mg total) by mouth daily. 90 tablet 3  . losartan (COZAAR) 100 MG tablet Take 100 mg by mouth daily.    . methocarbamol (ROBAXIN) 500 MG tablet Take 1 tablet (500 mg total) by mouth every 6 (six) hours as needed for muscle spasms. 60 tablet 0  . omeprazole (PRILOSEC) 20 MG capsule Take 20 mg by mouth at bedtime.     Marland Kitchen oxyCODONE (ROXICODONE) 5 MG immediate release tablet Take 1-2 tablets (5-10 mg total) by mouth every 4 (four) hours as needed. 60 tablet 0  . polyethylene glycol (MIRALAX / GLYCOLAX) packet Take 17 g by mouth daily as needed for mild constipation. 14 each 0  . warfarin (COUMADIN) 5 MG tablet TAKE ONE TABLET BY MOUTH ONCE DAILY, EXCEPT TUESDAYS AND THURSDAYS TAKE 1/2 TABLET. (Patient taking differently: Take 2.5mg  by mouth once daily at night on Sun, Tue, Thurs, and Sat, take 5 mg by mouth once daily at night on Mon, Wed, and Fri) 30 tablet 3   No current facility-administered medications for this visit.      Past Medical  History:  Diagnosis Date  . Anemia   . Anticoagulated on Coumadin    long-term  . Chronic kidney disease    frequent uti's last uti few yrs ago  . Diastolic CHF, chronic ([place])   . Dysrhythmia   . GERD (gastroesophageal reflux disease)   . Headache   . Hiatal hernia   . History of adenomatous polyp of colon   . History of esophageal stricture    s/p  dilatations  . History of GI bleed    03/ 2003  per EGD gastritis  . HOH (hard of hearing)    both ears  . Hypertension   . Non-rheumatic aortic stenosis    per last echo 12-14-2015    . OA (osteoarthritis)    oa  . Persistent atrial fibrillation [facility]) first dx  1999   primary cardiologist-  dr taylor-- hx DCCV  . Pneumonia 2009   with cpap usage  . Shortness of breath    when exertion  . Sleep apnea    last sleep study > 5 yrs   no cpap used    ROS:   All systems reviewed and negative except as noted in the HPI.   Past Surgical History:  Procedure Laterality Date  . ABDOMINAL HERNIA REPAIR  1990s  .  ANTERIOR CERVICAL DECOMP/DISCECTOMY FUSION N/A 02/14/2013   Procedure: ANTERIOR CERVICAL DECOMPRESSION/DISCECTOMY FUSION 2 LEVELS;  Surgeon: Charlie Pitter, MD;  Location: [facility];  Service: Neurosurgery;  Laterality: N/A;  . BALLOON DILATION N/A 12/11/2017   Procedure: BALLOON DILATION;  Surgeon: Clarene Essex, MD;  [facility];  Service: Endoscopy;  Laterality: N/A;  . COLONOSCOPY  last one 06-03-2012  . COLONOSCOPY  06/03/2012   Procedure: COLONOSCOPY;  Surgeon: Daneil Dolin, MD;  [facility];  Service: Endoscopy;  Laterality: N/A;  8:30  . esophageal strecching  yrs ago  . ESOPHAGOGASTRODUODENOSCOPY (EGD) WITH PROPOFOL N/A 12/11/2017   Procedure: ESOPHAGOGASTRODUODENOSCOPY (EGD) WITH PROPOFOL;  Surgeon: Clarene Essex, MD;  [facility];  Service: Endoscopy;  Laterality: N/A;  . ESOPHAGUS SURGERY  20 yrs ago   for food sticking  . FOOT SURGERY Right 04-27-2010   dr duda   joint arthrodesis   . right foot surgery  2018   to remove screw  . TOTAL KNEE ARTHROPLASTY Right 12/07/2017   Procedure: RIGHT TOTAL KNEE ARTHROPLASTY;  Surgeon: Mcarthur Rossetti, MD;  [facility];  Service: Orthopedics;  Laterality: Right;  . TRANSTHORACIC ECHOCARDIOGRAM  12-14-2015  dr taylor   moderate LVH, ef 55-60%, indeterminate diastolic function in setting of AFib/  moderate LAE and RAE/  mild to moderate calcific aoric stenosis with mild AR (valve area 1.33cm^2, peak grandient28mmHg, mean grandient 5mmHg)/ trivial MR/ mild TR/ PASP 36mmHg  . VOLVULUS REDUCTION  3220,2542     Family History  Problem Relation Age of Onset  . Heart disease Mother        Pacemaker  . Diabetes Father   . Colon cancer Neg Hx      Social History   Socioeconomic History  . Marital status: Married    Spouse name: Not on file  . Number of children: Not on file  . Years of education: Not on file  . Highest education level: Not on file  Occupational History  . 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 Financial resource strain: Not on file  . Food insecurity:    Worry: Not on file    Inability: Not on file  . Transportation needs:    Medical: Not on file    Non-medical: Not on file  Tobacco Use  . Smoking status: Never Smoker  . Smokeless tobacco: Never Used  Substance and Sexual Activity  . Alcohol use: No    Alcohol/week: 0.0 oz  . Drug use: No  . Sexual activity: Not on file  Lifestyle  . Physical activity:    Days per week: Not on file    Minutes per session: Not on file  . Stress: Not on file  Relationships  . Social connections:    Talks on phone: Not on file    Gets together: Not on file    Attends religious service: Not on file    Active member of club or organization: Not on file    Attends meetings of clubs or organizations: Not on file    Relationship status: Not on file  . Intimate partner violence:    Fear of current or ex  partner: Not on file    Emotionally abused: Not on file    Physically abused: Not on file    Forced sexual activity: Not on file  Other Topics Concern  . Not on file  Social History Narrative  No regular exercise     BP 120/88   Pulse 74   Ht 6\' 2"  (1.88 m)   Wt 265 lb (120.2 kg)   SpO2 95% Comment: on room air  BMI 34.02 kg/m   Physical Exam:  Well appearing 71 year old man, NAD HEENT: Unremarkable Neck: 7 cm JVD, no thyromegally Lymphatics:  No adenopathy Back:  No CVA tenderness Lungs:  Clear, with no wheezes, rales, or rhonchi HEART:  IRegular rate rhythm, no murmurs, no rubs, no clicks Abd:  soft, positive bowel sounds, no organomegally, no rebound, no guarding Ext:  2 plus pulses, no edema, no cyanosis, no clubbing Skin:  No rashes no nodules Neuro:  CN II through XII intact, motor grossly intact  DEVICE  Normal device function.  See PaceArt for details.   Assess/Plan: 1.  Atrial fibrillation -he has chronic atrial fibrillation and his ventricular rate is well controlled.  He has minimal palpitations. 2.  Hypertension -his blood pressure today is normal.  He has lost weight.  He is encouraged to maintain a low-sodium diet and continue his current medical therapy. 3.  Obesity -the patient has lost 20 pounds in the past 6 months.  I have encouraged him to continue losing weight.  His goal is 250. 4.  Dyslipidemia -he has had no side effects taking atorvastatin.  In addition he is encouraged to maintain a low-fat diet.  Cristopher Peru, MD

## 2018-02-15 ENCOUNTER — Encounter (HOSPITAL_COMMUNITY): Payer: Medicare Other | Admitting: Physical Therapy

## 2018-02-18 ENCOUNTER — Encounter (HOSPITAL_COMMUNITY): Payer: Medicare Other

## 2018-02-20 ENCOUNTER — Encounter (HOSPITAL_COMMUNITY): Payer: Medicare Other

## 2018-02-21 DIAGNOSIS — N400 Enlarged prostate without lower urinary tract symptoms: Secondary | ICD-10-CM | POA: Diagnosis not present

## 2018-02-25 ENCOUNTER — Encounter (INDEPENDENT_AMBULATORY_CARE_PROVIDER_SITE_OTHER): Payer: Self-pay | Admitting: Orthopaedic Surgery

## 2018-02-25 ENCOUNTER — Ambulatory Visit (INDEPENDENT_AMBULATORY_CARE_PROVIDER_SITE_OTHER): Payer: Medicare Other | Admitting: Orthopaedic Surgery

## 2018-02-25 DIAGNOSIS — Z96651 Presence of right artificial knee joint: Secondary | ICD-10-CM

## 2018-02-25 NOTE — Progress Notes (Signed)
The patient is 80 days status post a right total knee arthroplasty.  He is been taking care of his wife because she falls a lot but he says his knee is been doing well in terms of strength and range of motion he is lost a lot of weight.  On exam he lacks full extension by only about 3 degrees but his flexion is full.  The knee feels ligaments is stable.  Swelling is minimal and his calf is soft.  At this point will continue increase his activities as comfort allows.  I will see him back in 6 months for repeat AP and lateral of his right knee.

## 2018-03-14 ENCOUNTER — Other Ambulatory Visit: Payer: Self-pay | Admitting: Internal Medicine

## 2018-05-02 DIAGNOSIS — M1991 Primary osteoarthritis, unspecified site: Secondary | ICD-10-CM | POA: Diagnosis not present

## 2018-05-02 DIAGNOSIS — G894 Chronic pain syndrome: Secondary | ICD-10-CM | POA: Diagnosis not present

## 2018-05-02 DIAGNOSIS — Z6834 Body mass index (BMI) 34.0-34.9, adult: Secondary | ICD-10-CM | POA: Diagnosis not present

## 2018-05-02 DIAGNOSIS — E6609 Other obesity due to excess calories: Secondary | ICD-10-CM | POA: Diagnosis not present

## 2018-05-21 ENCOUNTER — Other Ambulatory Visit: Payer: Self-pay | Admitting: Internal Medicine

## 2018-05-23 ENCOUNTER — Other Ambulatory Visit: Payer: Self-pay | Admitting: *Deleted

## 2018-05-23 MED ORDER — WARFARIN SODIUM 5 MG PO TABS: ORAL_TABLET | ORAL | 1 refills | Status: DC

## 2018-05-27 ENCOUNTER — Ambulatory Visit (INDEPENDENT_AMBULATORY_CARE_PROVIDER_SITE_OTHER): Payer: Medicare Other | Admitting: *Deleted

## 2018-05-27 DIAGNOSIS — I4891 Unspecified atrial fibrillation: Secondary | ICD-10-CM

## 2018-05-27 DIAGNOSIS — Z5181 Encounter for therapeutic drug level monitoring: Secondary | ICD-10-CM

## 2018-05-27 DIAGNOSIS — Z7901 Long term (current) use of anticoagulants: Secondary | ICD-10-CM | POA: Diagnosis not present

## 2018-05-27 LAB — POCT INR: INR: 1.9 — AB (ref 2.0–3.0)

## 2018-05-27 NOTE — Patient Instructions (Signed)
Increase coumadin to 1 1/2 tablets daily except 1 tablet on Sundays, Tuesdays and Thursdays  Recheck INR in 4 weeks

## 2018-06-24 ENCOUNTER — Ambulatory Visit (INDEPENDENT_AMBULATORY_CARE_PROVIDER_SITE_OTHER): Payer: Medicare Other | Admitting: *Deleted

## 2018-06-24 DIAGNOSIS — Z5181 Encounter for therapeutic drug level monitoring: Secondary | ICD-10-CM

## 2018-06-24 DIAGNOSIS — I4891 Unspecified atrial fibrillation: Secondary | ICD-10-CM | POA: Diagnosis not present

## 2018-06-24 LAB — POCT INR: INR: 2.4 (ref 2.0–3.0)

## 2018-06-24 NOTE — Patient Instructions (Addendum)
Take 1 tablet daily except 1/2 tablet on Sundays, Tuesdays and Thursdays  Recheck INR in 5 weeks

## 2018-07-01 ENCOUNTER — Other Ambulatory Visit: Payer: Self-pay | Admitting: Internal Medicine

## 2018-07-13 ENCOUNTER — Other Ambulatory Visit: Payer: Self-pay | Admitting: Internal Medicine

## 2018-07-13 DIAGNOSIS — I5032 Chronic diastolic (congestive) heart failure: Secondary | ICD-10-CM

## 2018-07-29 ENCOUNTER — Ambulatory Visit (INDEPENDENT_AMBULATORY_CARE_PROVIDER_SITE_OTHER): Payer: Medicare Other | Admitting: *Deleted

## 2018-07-29 DIAGNOSIS — Z5181 Encounter for therapeutic drug level monitoring: Secondary | ICD-10-CM | POA: Diagnosis not present

## 2018-07-29 DIAGNOSIS — I4891 Unspecified atrial fibrillation: Secondary | ICD-10-CM | POA: Diagnosis not present

## 2018-07-29 LAB — POCT INR: INR: 2.1 (ref 2.0–3.0)

## 2018-07-29 NOTE — Patient Instructions (Signed)
Take 1 tablet daily except 1/2 tablet on Sundays, Tuesdays and Thursdays  Recheck INR in 6 weeks 

## 2018-08-01 DIAGNOSIS — Z23 Encounter for immunization: Secondary | ICD-10-CM | POA: Diagnosis not present

## 2018-08-28 ENCOUNTER — Ambulatory Visit (INDEPENDENT_AMBULATORY_CARE_PROVIDER_SITE_OTHER): Payer: Self-pay

## 2018-08-28 ENCOUNTER — Encounter (INDEPENDENT_AMBULATORY_CARE_PROVIDER_SITE_OTHER): Payer: Self-pay | Admitting: Orthopaedic Surgery

## 2018-08-28 ENCOUNTER — Ambulatory Visit (INDEPENDENT_AMBULATORY_CARE_PROVIDER_SITE_OTHER): Payer: Medicare Other | Admitting: Orthopaedic Surgery

## 2018-08-28 DIAGNOSIS — M1712 Unilateral primary osteoarthritis, left knee: Secondary | ICD-10-CM | POA: Diagnosis not present

## 2018-08-28 DIAGNOSIS — G8929 Other chronic pain: Secondary | ICD-10-CM | POA: Diagnosis not present

## 2018-08-28 DIAGNOSIS — M25562 Pain in left knee: Secondary | ICD-10-CM

## 2018-08-28 DIAGNOSIS — Z96651 Presence of right artificial knee joint: Secondary | ICD-10-CM

## 2018-08-28 MED ORDER — METHYLPREDNISOLONE ACETATE 40 MG/ML IJ SUSP
40.0000 mg | INTRAMUSCULAR | Status: AC | PRN
  Administered 2018-08-28: 40 mg via INTRA_ARTICULAR

## 2018-08-28 MED ORDER — LIDOCAINE HCL 1 % IJ SOLN
3.0000 mL | INTRAMUSCULAR | Status: AC | PRN
  Administered 2018-08-28: 3 mL

## 2018-08-28 NOTE — Progress Notes (Signed)
Office Visit Note   Patient: Brent Mason           Date of Birth: 1946-03-20           MRN: [id number] Visit Date: 08/28/2018              Requested by: Redmond School, [place], [place] [address] PCP: Redmond School, MD   Assessment & Plan: Visit Diagnoses:  1. History of total right knee replacement   2. Chronic pain of left knee   3. Unilateral primary osteoarthritis, left knee     Plan: From a right knee standpoint he is doing well and can follow-up as needed.  I agree with trying steroid injection in his left knee and he tolerated this well.  He understands the risk and benefits of injections.  For his left knee he will also follow-up as needed.  He understands if he gets bothering him enough to let us know.  Follow-Up Instructions: Return if symptoms worsen or fail to improve.   Orders:  Orders Placed This Encounter  Procedures  . Large Joint Inj  . XR Knee 1-2 Views Right   No orders of the defined types were placed in this encounter.     Procedures: Large Joint Inj: L knee on 08/28/2018 3:25 PM Indications: diagnostic evaluation and pain Details: 22 G 1.5 in needle, superolateral approach  Arthrogram: No  Medications: 3 mL lidocaine 1 %; 40 mg methylPREDNISolone acetate 40 MG/ML Outcome: tolerated well, no immediate complications Procedure, treatment alternatives, risks and benefits explained, specific risks discussed. Consent was given by the patient. Immediately prior to procedure a time out was called to verify the correct patient, procedure, equipment, support staff and site/side marked as required. Patient was prepped and draped in the usual sterile fashion.       Clinical Data: No additional findings.   Subjective: Chief Complaint  Patient presents with  . Right Knee - Follow-up  The patient is now 9 months status post a right total knee arthroplasty.  He has no issues with the knee at all he states.  His left knee has  been bothering him.  He would like to have a steroid injection in that today.  HPI  Review of Systems He currently denies any systemic illnesses.  Patient  Objective: Vital Signs: There were no vitals taken for this visit.  Physical Exam He is alert and oriented x3 and in no acute distress.  He is not walking with any assistive device. Ortho Exam His right operative knee has full flexion but he lacks full extension by at least 3 to 5 degrees.  Is otherwise feels ligamentously stable.  Incisions well-healed.  There is no evidence of infection or there is no effusion.  Examination of his left knee shows full range of motion of that knee with slight flexion contracture otherwise the terminal extension.  He has some slight medial lateral joint line tenderness but no effusion.  Specialty Comments:  No specialty comments available.  Imaging: Xr Knee 1-2 Views Right  Result Date: 08/28/2018 2 views of the right knee show a total knee arthroplasty with no complicating features.  There is no evidence of loosening.  There is no effusion.    PMFS History: Patient Active Problem List   Diagnosis Date Noted  . Status post total right knee replacement 12/07/2017  . Unilateral primary osteoarthritis, right knee 11/05/2017  . Primary osteoarthritis of both knees 05/28/2017  . S/P foot surgery, right  12/05/2016  . Pain in right foot 12/05/2016  . Pain from implanted hardware 12/05/2016  . Bilateral carotid bruits 07/08/2014  . Encounter for therapeutic drug monitoring 11/13/2013  . Claudication of left lower extremity ([place]) 12/02/2012  . Neck pain 09/16/2012  . Cervical spondylosis without myelopathy 09/10/2012  . Hx of adenomatous colonic polyps 05/07/2012  . Rectal bleeding 05/07/2012  . GERD (gastroesophageal reflux disease) 05/07/2012  . Long term current use of anticoagulant 01/25/2011  . HYPERLIPIDEMIA 11/21/2010  . DYSPNEA 10/20/2010  . MORBID OBESITY 09/01/2009  . Atrial  fibrillation ([place]) 09/01/2009  . Essential hypertension 06/14/2009  . ESOPHAGEAL STRICTURE 06/14/2009  . DIZZINESS 06/14/2009   Past Medical History:  Diagnosis Date  . Anemia   . Anticoagulated on Coumadin    long-term  . Chronic kidney disease    frequent uti's last uti few yrs ago  . Diastolic CHF, chronic ([place])   . Dysrhythmia   . GERD (gastroesophageal reflux disease)   . Headache   . Hiatal hernia   . History of adenomatous polyp of colon   . History of esophageal stricture    s/p  dilatations  . History of GI bleed    03/ 2003  per EGD gastritis  . HOH (hard of hearing)    both ears  . Hypertension   . Non-rheumatic aortic stenosis    per last echo 12-14-2015    . OA (osteoarthritis)    oa  . Persistent atrial fibrillation first dx  1999   primary cardiologist-  dr taylor-- hx DCCV  . Pneumonia 2009   with cpap usage  . Shortness of breath    when exertion  . Sleep apnea    last sleep study > 5 yrs   no cpap used    Family History  Problem Relation Age of Onset  . Heart disease Mother        Pacemaker  . Diabetes Father   . Colon cancer Neg Hx     Past Surgical History:  Procedure Laterality Date  . ABDOMINAL HERNIA REPAIR  1990s  . ANTERIOR CERVICAL DECOMP/DISCECTOMY FUSION N/A 02/14/2013   Procedure: ANTERIOR CERVICAL DECOMPRESSION/DISCECTOMY FUSION 2 LEVELS;  Surgeon: Charlie Pitter, MD;  Location: [facility];  Service: Neurosurgery;  Laterality: N/A;  . BALLOON DILATION N/A 12/11/2017   Procedure: BALLOON DILATION;  Surgeon: Clarene Essex, MD;  Location: [facility];  Service: Endoscopy;  Laterality: N/A;  . COLONOSCOPY  last one 06-03-2012  . COLONOSCOPY  06/03/2012   Procedure: COLONOSCOPY;  Surgeon: Daneil Dolin, MD;  [facility];  Service: Endoscopy;  Laterality: N/A;  8:30  . esophageal strecching  yrs ago  . ESOPHAGOGASTRODUODENOSCOPY (EGD) WITH PROPOFOL N/A 12/11/2017   Procedure: ESOPHAGOGASTRODUODENOSCOPY (EGD) WITH PROPOFOL;   Surgeon: Clarene Essex, MD;  Location: [facility];  Service: Endoscopy;  Laterality: N/A;  . ESOPHAGUS SURGERY  20 yrs ago   for food sticking  . FOOT SURGERY Right 04-27-2010   dr duda   joint arthrodesis  . right foot surgery  2018   to remove screw  . TOTAL KNEE ARTHROPLASTY Right 12/07/2017   Procedure: RIGHT TOTAL KNEE ARTHROPLASTY;  Surgeon: Mcarthur Rossetti, MD;  Location: [facility];  Service: Orthopedics;  Laterality: Right;  . TRANSTHORACIC ECHOCARDIOGRAM  12-14-2015  dr taylor   moderate LVH, ef 55-60%, indeterminate diastolic function in setting of AFib/  moderate LAE and RAE/  mild to moderate calcific aoric stenosis with mild AR (valve area 1.33cm^2, peak  grandient19mmHg, mean grandient 54mmHg)/ trivial MR/ mild TR/ PASP 81mmHg  . VOLVULUS REDUCTION  [id number]   Social History   Occupational History  . 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 Smoking status: Never Smoker  . Smokeless tobacco: Never Used  Substance and Sexual Activity  . Alcohol use: No    Alcohol/week: 0.0 standard drinks  . Drug use: No  . Sexual activity: Not on file

## 2018-09-09 ENCOUNTER — Ambulatory Visit (INDEPENDENT_AMBULATORY_CARE_PROVIDER_SITE_OTHER): Payer: Medicare Other | Admitting: *Deleted

## 2018-09-09 DIAGNOSIS — I4891 Unspecified atrial fibrillation: Secondary | ICD-10-CM | POA: Diagnosis not present

## 2018-09-09 DIAGNOSIS — Z5181 Encounter for therapeutic drug level monitoring: Secondary | ICD-10-CM | POA: Diagnosis not present

## 2018-09-09 LAB — POCT INR: INR: 2.5 (ref 2.0–3.0)

## 2018-09-09 NOTE — Patient Instructions (Signed)
Take 1 tablet daily except 1/2 tablet on Sundays, Tuesdays and Thursdays  Recheck INR in 6 weeks 

## 2018-09-16 DIAGNOSIS — Z6835 Body mass index (BMI) 35.0-35.9, adult: Secondary | ICD-10-CM | POA: Diagnosis not present

## 2018-09-16 DIAGNOSIS — M2550 Pain in unspecified joint: Secondary | ICD-10-CM | POA: Diagnosis not present

## 2018-09-16 DIAGNOSIS — M1991 Primary osteoarthritis, unspecified site: Secondary | ICD-10-CM | POA: Diagnosis not present

## 2018-09-16 DIAGNOSIS — G8929 Other chronic pain: Secondary | ICD-10-CM | POA: Diagnosis not present

## 2018-09-16 DIAGNOSIS — Z0001 Encounter for general adult medical examination with abnormal findings: Secondary | ICD-10-CM | POA: Diagnosis not present

## 2018-10-21 ENCOUNTER — Ambulatory Visit (INDEPENDENT_AMBULATORY_CARE_PROVIDER_SITE_OTHER): Payer: Medicare Other | Admitting: Pharmacist

## 2018-10-21 DIAGNOSIS — I4891 Unspecified atrial fibrillation: Secondary | ICD-10-CM | POA: Diagnosis not present

## 2018-10-21 DIAGNOSIS — Z5181 Encounter for therapeutic drug level monitoring: Secondary | ICD-10-CM | POA: Diagnosis not present

## 2018-10-21 LAB — POCT INR: INR: 2 (ref 2.0–3.0)

## 2018-10-21 NOTE — Patient Instructions (Signed)
Description   Take 1 tablet daily except 1/2 tablet on Sundays, Tuesdays and Thursdays  Recheck INR in 6 weeks

## 2018-11-16 IMAGING — DX DG KNEE 1-2V PORT*R*
2 series · 2 of 2 positions shown · non-contrast
Comparison: 05/28/2017.

CLINICAL DATA: Total knee replacement.

EXAM:
PORTABLE RIGHT KNEE - 1-2 VIEW

[knee ap]
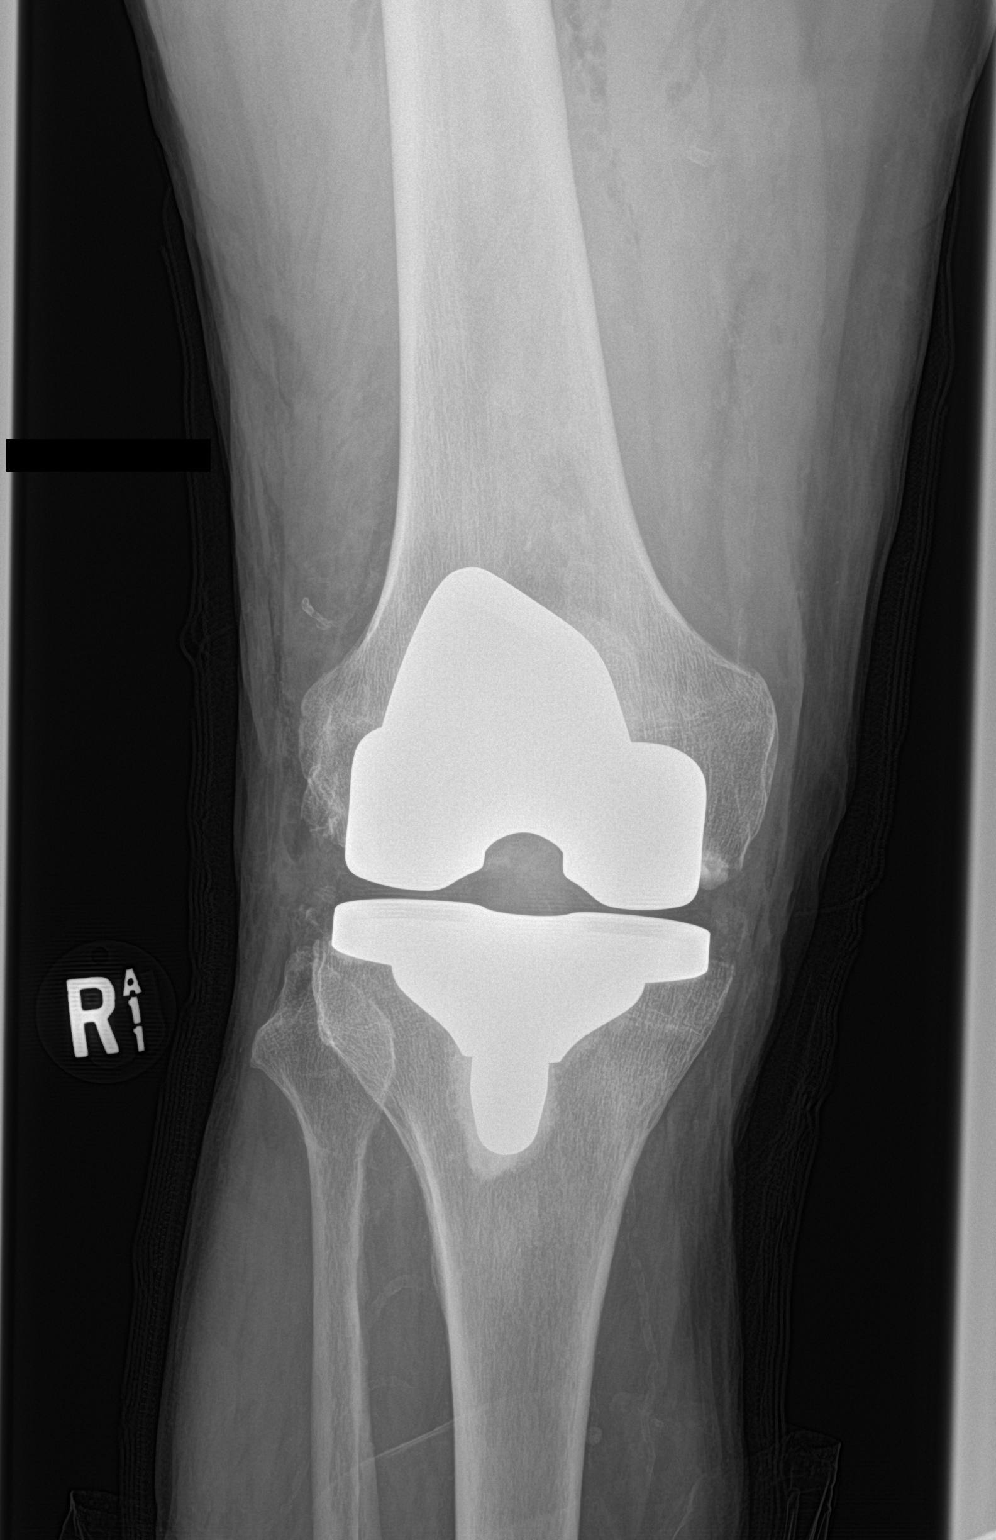

[knee lat]
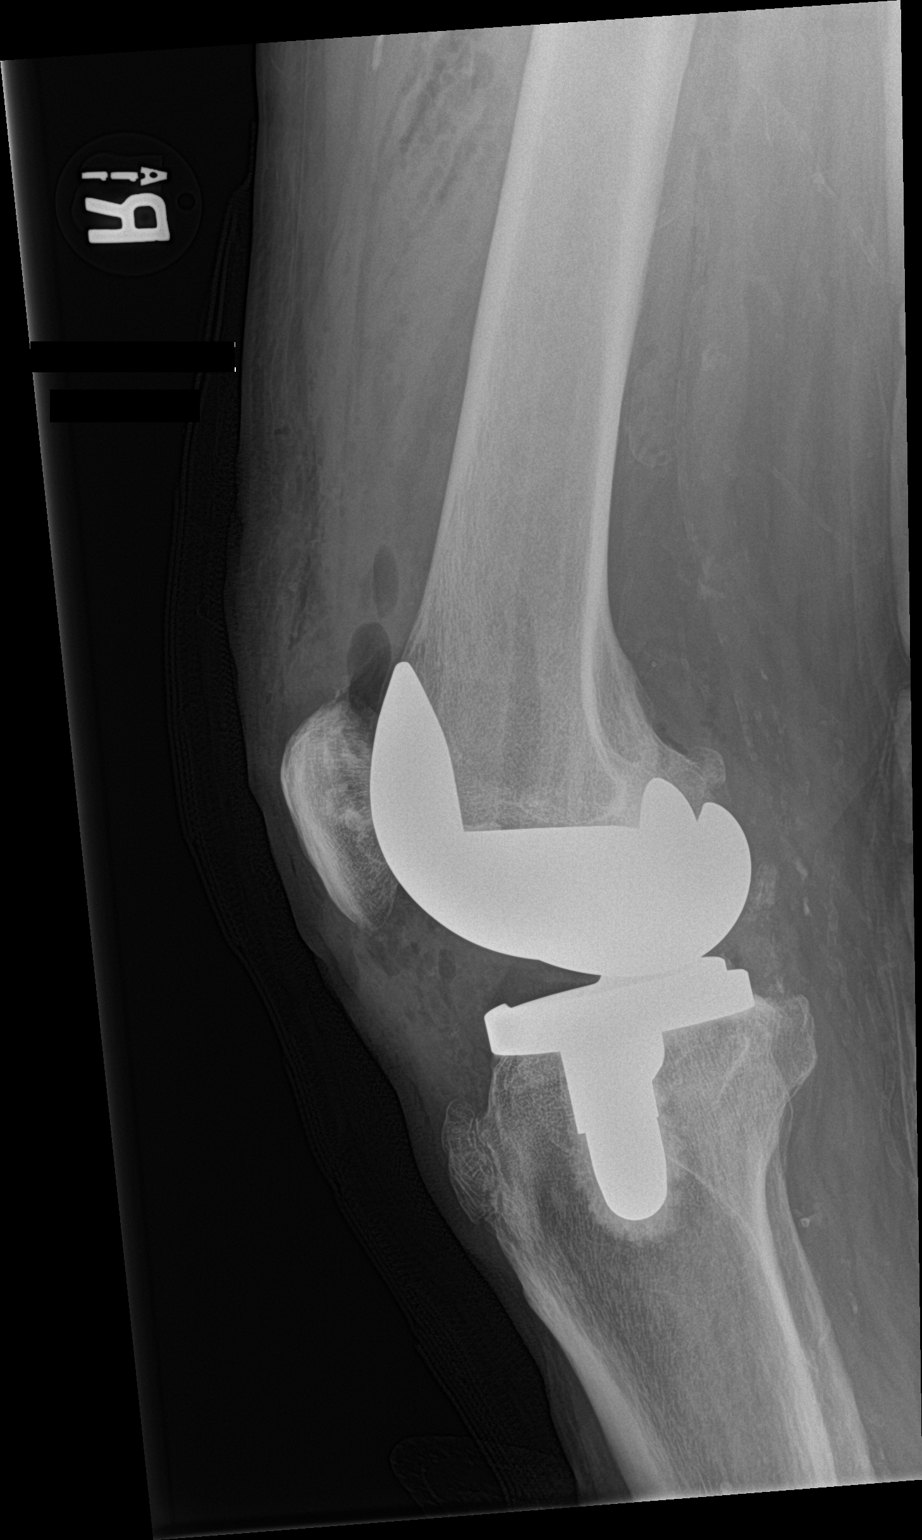

[2 of 2 positions shown; findings below may reference images not displayed]

FINDINGS: Total right knee replacement. Hardware intact. Anatomic alignment.
No acute bony abnormality. Peripheral vascular calcification.
IMPRESSION: 1.  Total right knee replacement anatomic alignment.

2.  Peripheral vascular disease.

## 2018-11-19 IMAGING — RF DG ESOPHAGUS
9 of 11 series · 13 of 24 positions shown · non-contrast
Comparison: None.

CLINICAL DATA: Dysphagia.

EXAM:
ESOPHOGRAM / BARIUM SWALLOW / BARIUM TABLET STUDY
TECHNIQUE: Combined double contrast and single contrast examination performed
using effervescent crystals, thick barium liquid, and thin barium
liquid. The patient was observed with fluoroscopy swallowing a 13 mm
barium sulphate tablet.
FLUOROSCOPY TIME:  Fluoroscopy Time:  3.0 minutes.
Radiation Exposure Index (if provided by the fluoroscopic device):
55.3 mGy.
Number of Acquired Spot Images: 1

[Series 1: cp_standard · 0.51mm/px · 2 of 147 frames shown (1 of 9)]
[frame 6/147]
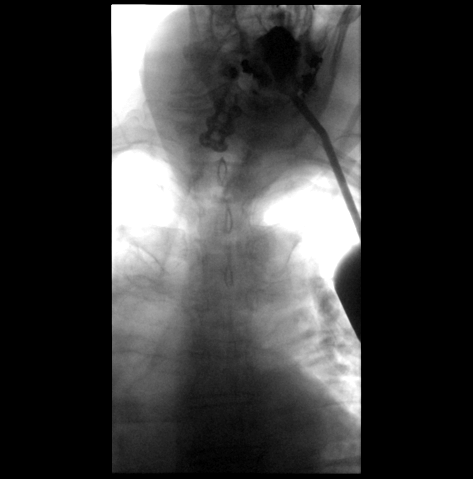
[frame 125/147]
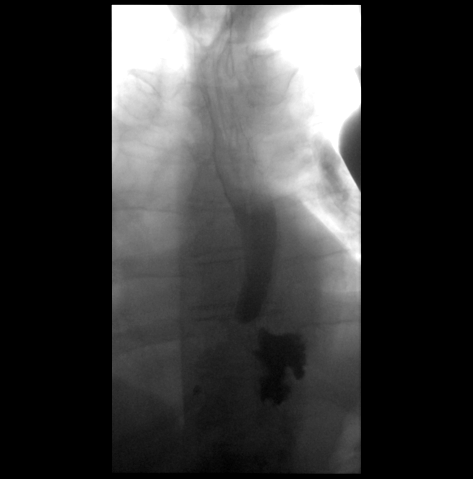

[Series 2: cp_standard · 0.34mm/px · 1 of 102 frames shown (2 of 9)]
[frame 16/102]
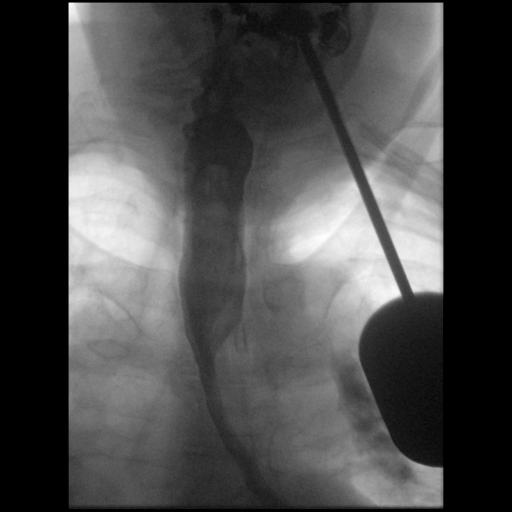

[Series 3: cp_standard · 0.34mm/px · 2 of 66 frames shown (3 of 9)]
[frame 10/66]
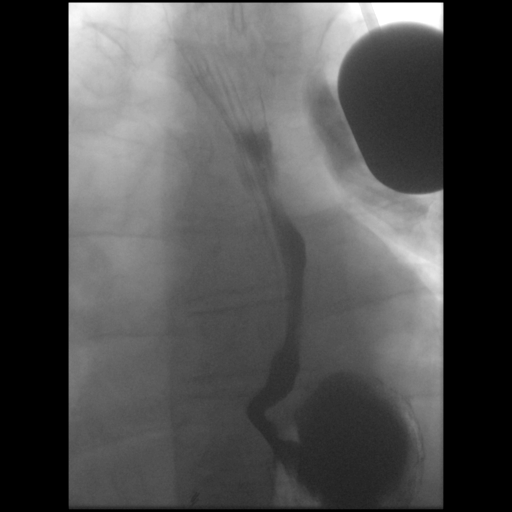
[frame 57/66]
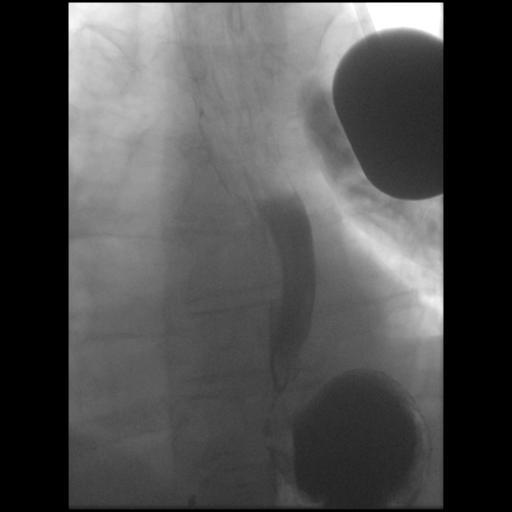

[Series 5: cp_standard · 0.17mm/px · 1 of 1 slices shown (4 of 9)]
[im 1/1]
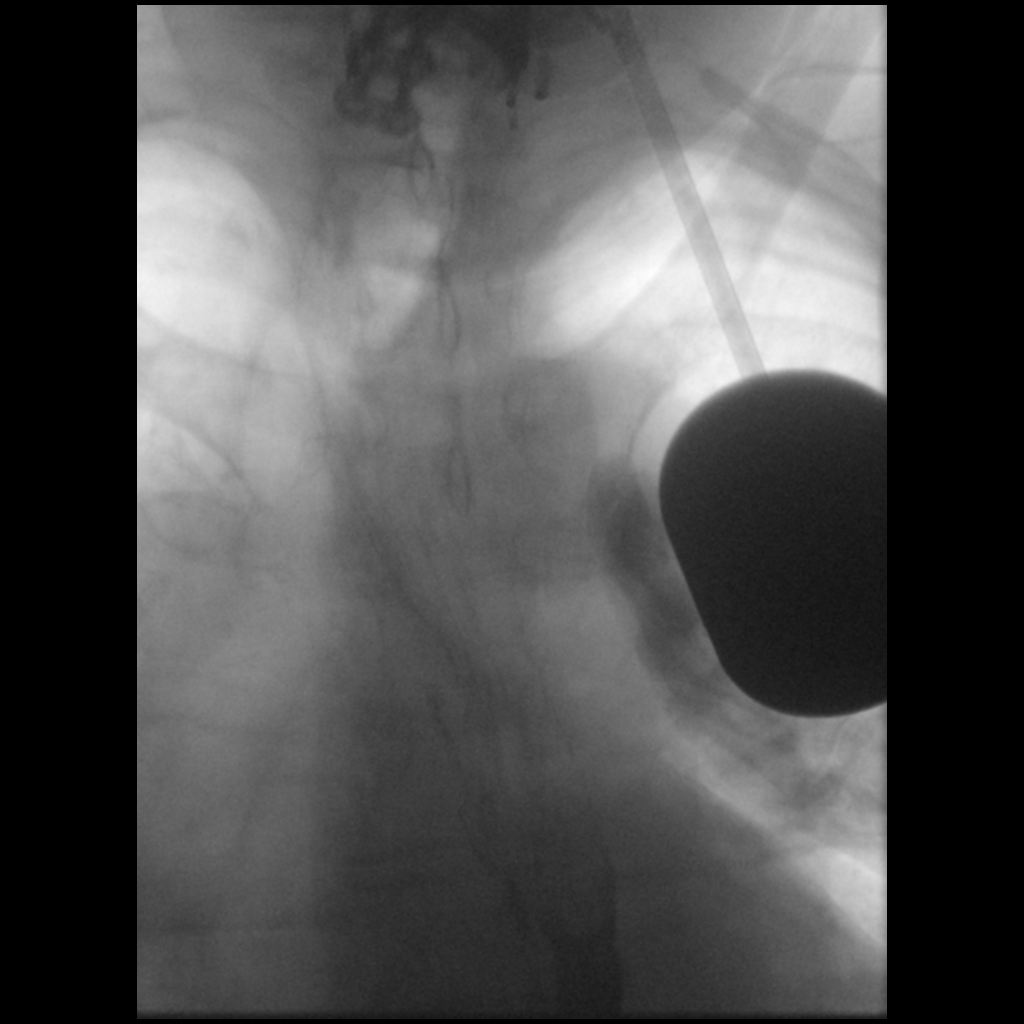

[Series 6: cp_standard · 0.34mm/px · 1 of 41 frames shown (5 of 9)]
[frame 29/41]
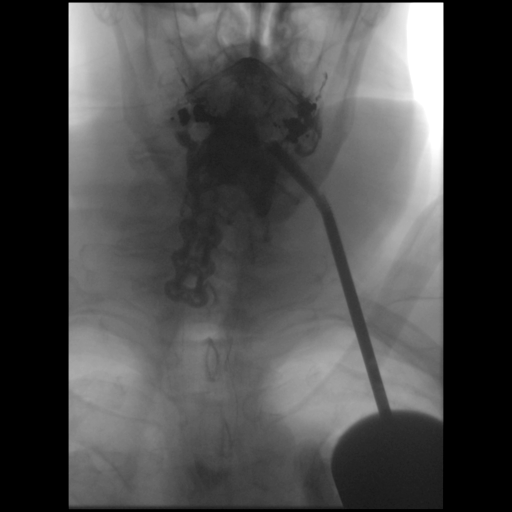

[Series 7: cp_standard · 0.34mm/px · 2 of 97 frames shown (6 of 9)]
[frame 15/97]
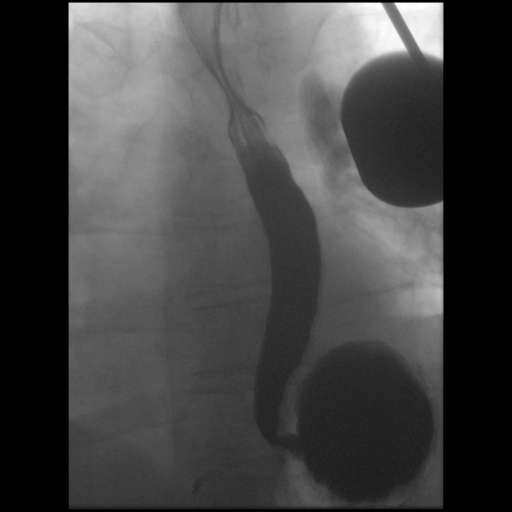
[frame 83/97]
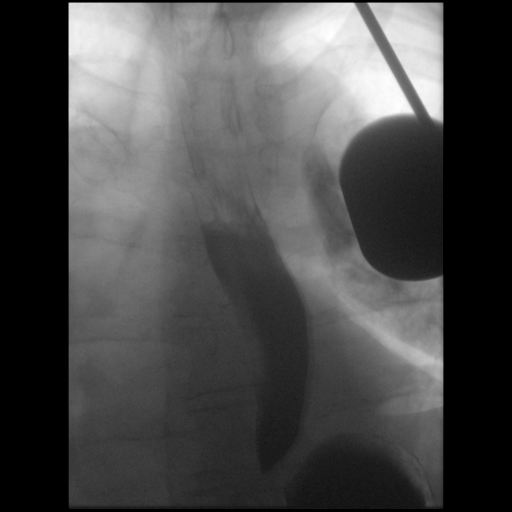

[Series 9: cp_standard · 0.34mm/px · 2 of 47 frames shown (7 of 9)]
[frame 8/47]
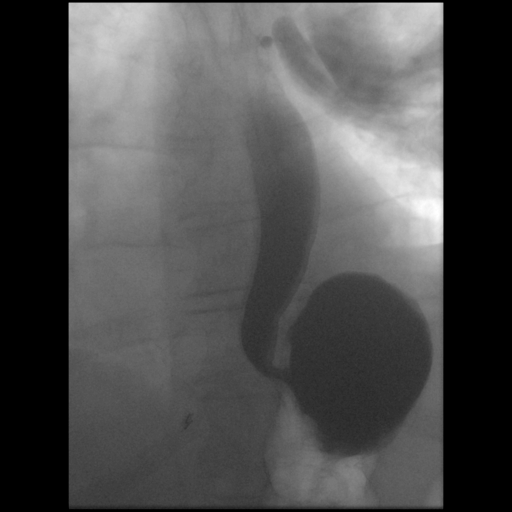
[frame 40/47]
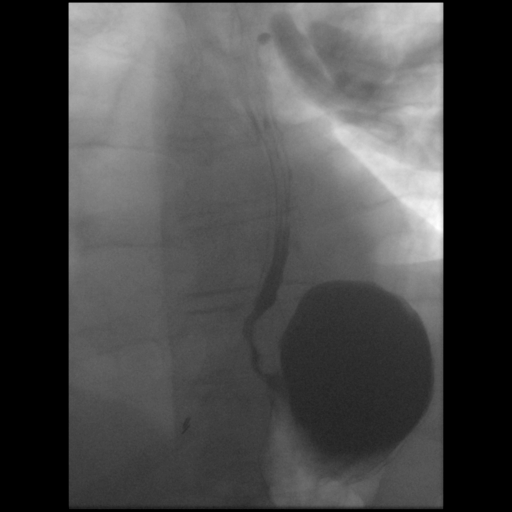

[Series 10: cp_standard · 0.34mm/px · 1 of 106 frames shown (8 of 9)]
[frame 16/106]
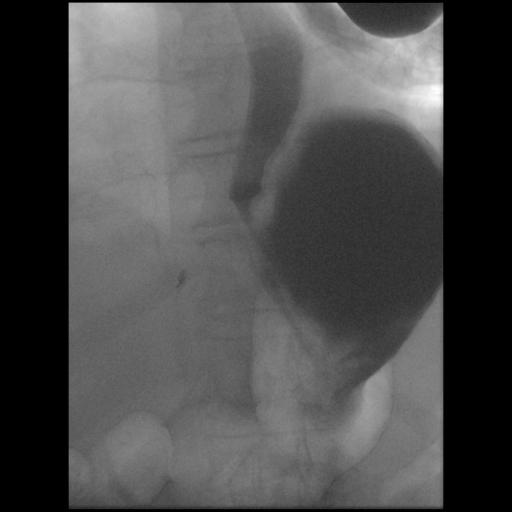

[Series 11: cp_standard · 0.17mm/px · 1 of 1 slices shown (9 of 9)]
[im 1/1]
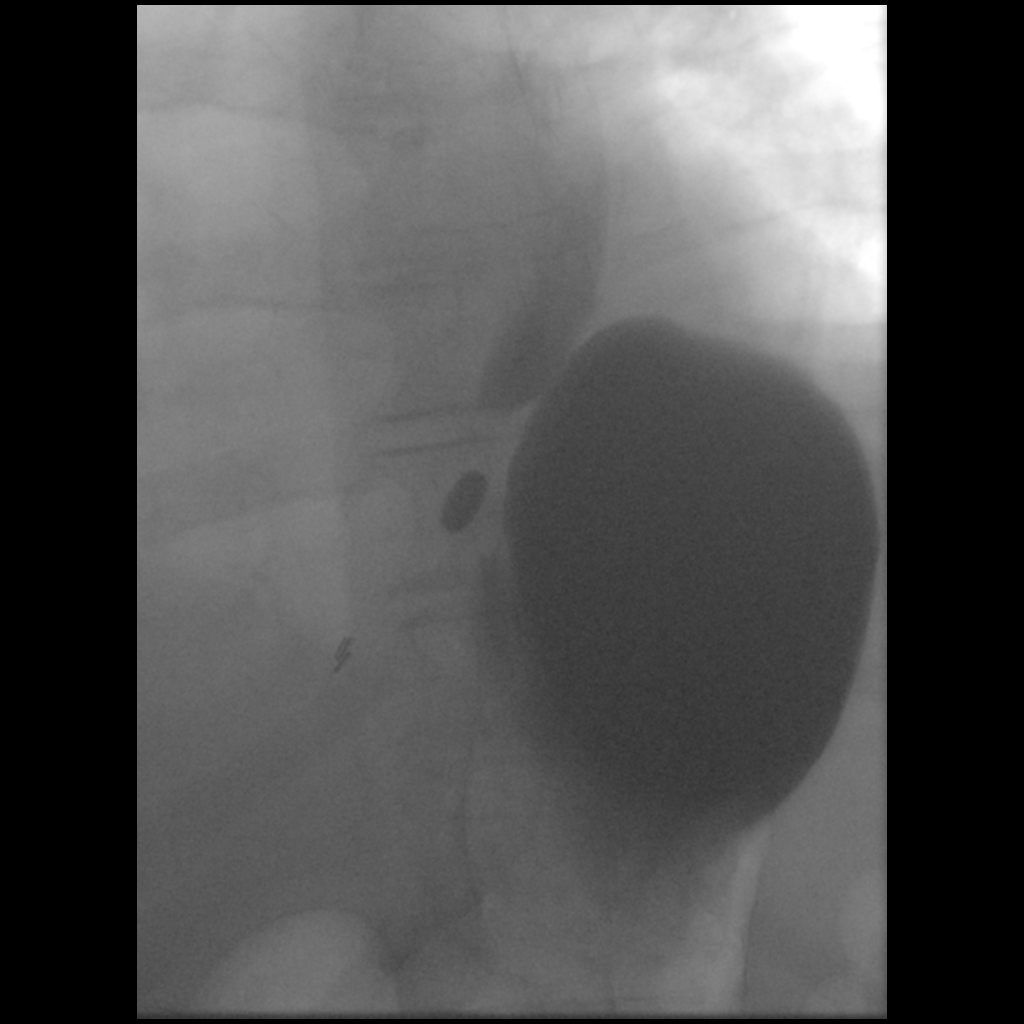

[13 of 24 positions shown; findings below may reference images not displayed]

FINDINGS: The pharyngeal phase of swallowing was normal.

Primary peristaltic waves in the esophagus were normal. Mild
tertiary contractions. No obstruction to the forward flow of
contrast throughout the esophagus and into the stomach.

Normal esophageal course and contour. Normal esophageal mucosal
pattern. There is a short segment, smooth, benign-appearing
stricture of the distal esophagus just proximal to gastroesophageal
junction. No esophageal ulceration or other significant abnormality.

No hiatal hernia. No gastroesophageal reflux occurred spontaneously
or was elicited.

A 13 mm barium tablet did not pass into the stomach.
IMPRESSION: 1. Short segment, smooth, benign-appearing stricture of the distal
esophagus just proximal to the gastroesophageal junction. The barium
tablet would not pass into the stomach.

## 2018-11-20 IMAGING — DX DG CHEST 1V
1 series · 1 of 1 positions shown · non-contrast
Comparison: 12/08/2017

CLINICAL DATA: Shortness of Breath

EXAM:
CHEST 1 VIEW

[chest ap]
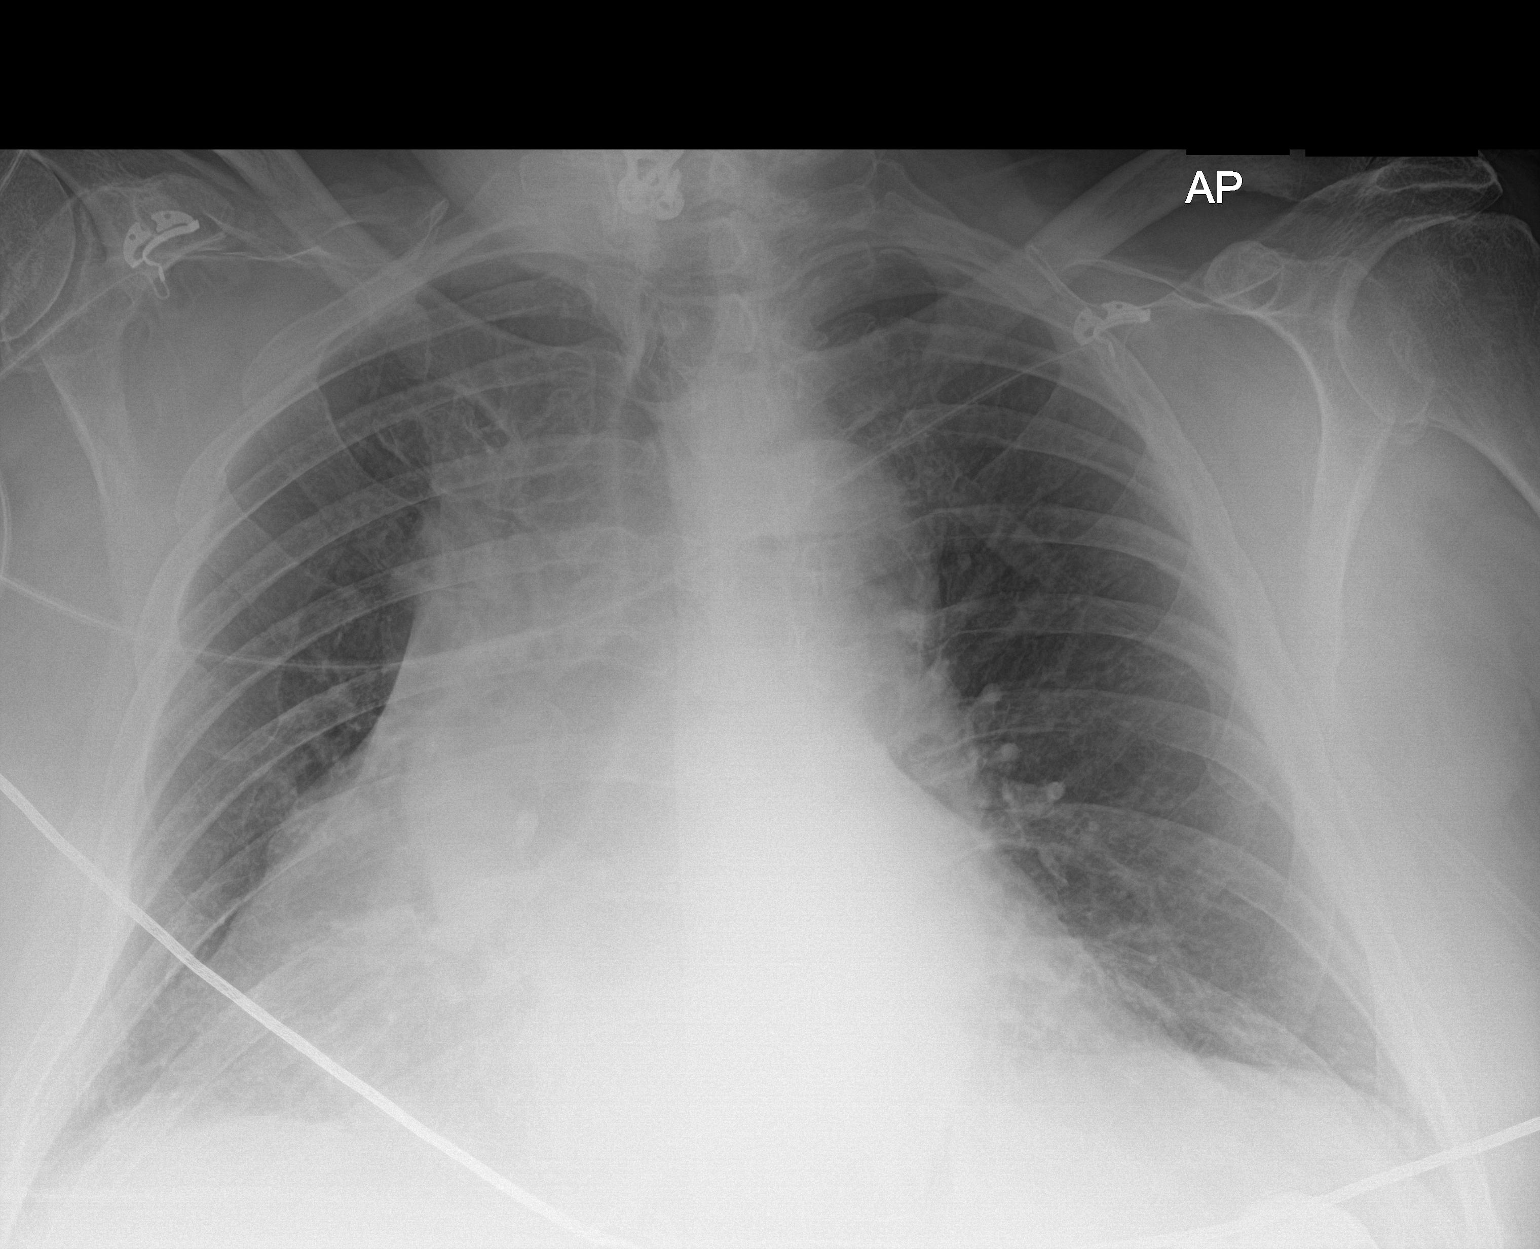

[1 of 1 positions shown; findings below may reference images not displayed]

FINDINGS: Cardiomegaly. Bibasilar atelectasis. No effusions. Mild vascular
congestion. No acute bony abnormality.
IMPRESSION: Cardiomegaly with vascular congestion.  Bibasilar atelectasis.

## 2018-12-02 ENCOUNTER — Ambulatory Visit (INDEPENDENT_AMBULATORY_CARE_PROVIDER_SITE_OTHER): Payer: Medicare Other | Admitting: Pharmacist

## 2018-12-02 DIAGNOSIS — Z5181 Encounter for therapeutic drug level monitoring: Secondary | ICD-10-CM | POA: Diagnosis not present

## 2018-12-02 DIAGNOSIS — I4891 Unspecified atrial fibrillation: Secondary | ICD-10-CM | POA: Diagnosis not present

## 2018-12-02 LAB — POCT INR: INR: 2.3 (ref 2.0–3.0)

## 2018-12-02 NOTE — Patient Instructions (Signed)
Description   Take 1 tablet daily except 1/2 tablet on Sundays, Tuesdays and Thursdays  Recheck INR in 6 weeks

## 2019-01-10 ENCOUNTER — Telehealth: Payer: Self-pay | Admitting: *Deleted

## 2019-01-10 NOTE — Telephone Encounter (Signed)
Spoke with Brent Mason in regards to his appointment for coumadin check on 01/13/2019 . He states that he has too much going on and will need to reschedule when he can be seen in [place].

## 2019-01-13 NOTE — Telephone Encounter (Signed)
LMOM for pt to call and reschedule coumadin appt.  Explained INR appts will be drive thru in [place] thru 02/28/19.

## 2019-01-28 ENCOUNTER — Other Ambulatory Visit: Payer: Self-pay | Admitting: Student

## 2019-01-28 DIAGNOSIS — I5032 Chronic diastolic (congestive) heart failure: Secondary | ICD-10-CM

## 2019-02-10 DIAGNOSIS — G894 Chronic pain syndrome: Secondary | ICD-10-CM | POA: Diagnosis not present

## 2019-02-10 DIAGNOSIS — I10 Essential (primary) hypertension: Secondary | ICD-10-CM | POA: Diagnosis not present

## 2019-02-10 DIAGNOSIS — M1991 Primary osteoarthritis, unspecified site: Secondary | ICD-10-CM | POA: Diagnosis not present

## 2019-02-10 DIAGNOSIS — Z681 Body mass index (BMI) 19 or less, adult: Secondary | ICD-10-CM | POA: Diagnosis not present

## 2019-04-01 DIAGNOSIS — G894 Chronic pain syndrome: Secondary | ICD-10-CM | POA: Diagnosis not present

## 2019-04-10 ENCOUNTER — Other Ambulatory Visit: Payer: Self-pay | Admitting: Internal Medicine

## 2019-04-11 NOTE — Telephone Encounter (Signed)
Called pt as he is overdue for Anticoagulation Appt. He was last seen on 12/02/2018 in the [facility]. Left a message for the pt to call back and schedule an appt and that we could not fill the Warfarin refill request at this time. Will await for the pt to call back to get scheduled for an appt.

## 2019-04-14 ENCOUNTER — Other Ambulatory Visit: Payer: Self-pay

## 2019-04-14 ENCOUNTER — Ambulatory Visit (INDEPENDENT_AMBULATORY_CARE_PROVIDER_SITE_OTHER): Payer: Medicare Other | Admitting: *Deleted

## 2019-04-14 DIAGNOSIS — I4891 Unspecified atrial fibrillation: Secondary | ICD-10-CM | POA: Diagnosis not present

## 2019-04-14 DIAGNOSIS — Z5181 Encounter for therapeutic drug level monitoring: Secondary | ICD-10-CM | POA: Diagnosis not present

## 2019-04-14 LAB — POCT INR: INR: 2 (ref 2.0–3.0)

## 2019-04-14 MED ORDER — WARFARIN SODIUM 5 MG PO TABS: ORAL_TABLET | ORAL | 6 refills | Status: DC

## 2019-04-14 NOTE — Patient Instructions (Signed)
Take 1 tablet daily except 1/2 tablet on Sundays, Tuesdays and Thursdays  Recheck INR in 6 weeks

## 2019-05-16 ENCOUNTER — Other Ambulatory Visit: Payer: Self-pay | Admitting: Student

## 2019-05-16 DIAGNOSIS — I5032 Chronic diastolic (congestive) heart failure: Secondary | ICD-10-CM

## 2019-05-26 DIAGNOSIS — Z0120 Encounter for dental examination and cleaning without abnormal findings: Secondary | ICD-10-CM | POA: Diagnosis not present

## 2019-06-05 DIAGNOSIS — R3915 Urgency of urination: Secondary | ICD-10-CM | POA: Diagnosis not present

## 2019-06-05 DIAGNOSIS — R350 Frequency of micturition: Secondary | ICD-10-CM | POA: Diagnosis not present

## 2019-06-05 DIAGNOSIS — N3020 Other chronic cystitis without hematuria: Secondary | ICD-10-CM | POA: Diagnosis not present

## 2019-06-17 ENCOUNTER — Other Ambulatory Visit: Payer: Self-pay | Admitting: Student

## 2019-06-17 ENCOUNTER — Other Ambulatory Visit: Payer: Self-pay | Admitting: Internal Medicine

## 2019-06-17 DIAGNOSIS — I5032 Chronic diastolic (congestive) heart failure: Secondary | ICD-10-CM

## 2019-08-07 ENCOUNTER — Other Ambulatory Visit: Payer: Self-pay

## 2019-08-07 ENCOUNTER — Encounter: Payer: Self-pay | Admitting: Internal Medicine

## 2019-08-07 ENCOUNTER — Ambulatory Visit (INDEPENDENT_AMBULATORY_CARE_PROVIDER_SITE_OTHER): Payer: Medicare Other | Admitting: Internal Medicine

## 2019-08-07 VITALS — BP 134/74 | HR 63 | Temp 98.5°F | Ht 74.0 in | Wt 272.0 lb

## 2019-08-07 DIAGNOSIS — I480 Paroxysmal atrial fibrillation: Secondary | ICD-10-CM

## 2019-08-07 NOTE — Patient Instructions (Signed)
Medication Instructions:  Your physician recommends that you continue on your current medications as directed. Please refer to the Current Medication list given to you today.  *If you need a refill on your cardiac medications before your next appointment, please call your pharmacy*  Lab Work: NONE  If you have labs (blood work) drawn today and your tests are completely normal, you will receive your results only by: Marland Kitchen MyChart Message (if you have MyChart) OR . A paper copy in the mail If you have any lab test that is abnormal or we need to change your treatment, we will call you to review the results.  Testing/Procedures: NONE   Follow-Up: At [facility], you and your health needs are our priority.  As part of our continuing mission to provide you with exceptional heart care, we have created designated Provider Care Teams.  These Care Teams include your primary Cardiologist (physician) and Advanced Practice Providers (APPs -  Physician Assistants and Nurse Practitioners) who all work together to provide you with the care you need, when you need it.  Your next appointment:   6 Months   The format for your next appointment:   In Person  Provider:   Cristopher Peru, MD  Other Instructions Thank you for choosing [facility]!

## 2019-08-07 NOTE — Progress Notes (Signed)
HPI Brent Mason returns today for follow-up of his atrial fibrillation, hypertension, dyslipidemia, and obesity.  He is saddened by the loss of his wife who died 2 months ago. He denies chest pain or shortness of breath.  No peripheral edema.  No palpitations.  He recently had knee replacement surgery and is healed up very nicely.  No Known Allergies   Current Outpatient Medications  Medication Sig Dispense Refill  . atorvastatin (LIPITOR) 10 MG tablet Take 10 mg by mouth daily.   10  . carvedilol (COREG) 3.125 MG tablet TAKE (1) TABLET BY MOUTH TWICE DAILY. 180 tablet 0  . ferrous sulfate 325 (65 FE) MG tablet Take 325 mg by mouth daily with breakfast.      . fluticasone (FLONASE) 50 MCG/ACT nasal spray Place 1 spray into the nose daily.     . furosemide (LASIX) 40 MG tablet TAKE (1) TABLET BY MOUTH ONCE DAILY. 30 tablet 0  . losartan (COZAAR) 100 MG tablet Take 100 mg by mouth daily.    . methocarbamol (ROBAXIN) 500 MG tablet Take 1 tablet (500 mg total) by mouth every 6 (six) hours as needed for muscle spasms. 60 tablet 0  . omeprazole (PRILOSEC) 20 MG capsule Take 20 mg by mouth at bedtime.     Marland Kitchen oxyCODONE (ROXICODONE) 5 MG immediate release tablet Take 1-2 tablets (5-10 mg total) by mouth every 4 (four) hours as needed. 60 tablet 0  . polyethylene glycol (MIRALAX / GLYCOLAX) packet Take 17 g by mouth daily as needed for mild constipation. 14 each 0  . warfarin (COUMADIN) 5 MG tablet Take 1 tablet daily except 1/2 tablet on Sundays, Tuesdays and Thursdays 30 tablet 6   No current facility-administered medications for this visit.      Past Medical History:  Diagnosis Date  . Anemia   . Anticoagulated on Coumadin    long-term  . Chronic kidney disease    frequent uti's last uti few yrs ago  . Diastolic CHF, chronic ([place])   . Dysrhythmia   . GERD (gastroesophageal reflux disease)   . Headache   . Hiatal hernia   . History of adenomatous polyp of colon   . History of  esophageal stricture    s/p  dilatations  . History of GI bleed    03/ 2003  per EGD gastritis  . HOH (hard of hearing)    both ears  . Hypertension   . Non-rheumatic aortic stenosis    per last echo 12-14-2015    . OA (osteoarthritis)    oa  . Persistent atrial fibrillation [facility]) first dx  1999   primary cardiologist-  dr Emmajean Ratledge-- hx DCCV  . Pneumonia 2009   with cpap usage  . Shortness of breath    when exertion  . Sleep apnea    last sleep study > 5 yrs   no cpap used    ROS:   All systems reviewed and negative except as noted in the HPI.   Past Surgical History:  Procedure Laterality Date  . ABDOMINAL HERNIA REPAIR  1990s  . ANTERIOR CERVICAL DECOMP/DISCECTOMY FUSION N/A 02/14/2013   Procedure: ANTERIOR CERVICAL DECOMPRESSION/DISCECTOMY FUSION 2 LEVELS;  Surgeon: Charlie Pitter, MD;  Location: [place] [facility];  Service: Neurosurgery;  Laterality: N/A;  . BALLOON DILATION N/A 12/11/2017   Procedure: BALLOON DILATION;  Surgeon: Clarene Essex, MD;  [facility];  Service: Endoscopy;  Laterality: N/A;  . COLONOSCOPY  last one 06-03-2012  .  COLONOSCOPY  06/03/2012   Procedure: COLONOSCOPY;  Surgeon: Daneil Dolin, MD;  [facility];  Service: Endoscopy;  Laterality: N/A;  8:30  . esophageal strecching  yrs ago  . ESOPHAGOGASTRODUODENOSCOPY (EGD) WITH PROPOFOL N/A 12/11/2017   Procedure: ESOPHAGOGASTRODUODENOSCOPY (EGD) WITH PROPOFOL;  Surgeon: Clarene Essex, MD;  [facility];  Service: Endoscopy;  Laterality: N/A;  . ESOPHAGUS SURGERY  20 yrs ago   for food sticking  . FOOT SURGERY Right 04-27-2010   dr duda   joint arthrodesis  . right foot surgery  2018   to remove screw  . TOTAL KNEE ARTHROPLASTY Right 12/07/2017   Procedure: RIGHT TOTAL KNEE ARTHROPLASTY;  Surgeon: Mcarthur Rossetti, MD;  [facility];  Service: Orthopedics;  Laterality: Right;  . TRANSTHORACIC ECHOCARDIOGRAM  12-14-2015  dr Shante M[place]et   moderate LVH, ef 55-60%, indeterminate  diastolic function in setting of AFib/  moderate LAE and RAE/  mild to moderate calcific aoric stenosis with mild AR (valve area 1.33cm^2, peak grandient58mmHg, mean grandient 11mmHg)/ trivial MR/ mild TR/ PASP 90mmHg  . VOLVULUS REDUCTION  [id number]     Family History  Problem Relation Age of Onset  . Heart disease Mother        Pacemaker  . Diabetes Father   . Colon cancer Neg Hx      Social History   Socioeconomic History  . Marital status: Married    Spouse name: Not on file  . Number of children: Not on file  . Years of education: Not on file  . Highest education level: Not on file  Occupational History  . 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 Financial resource strain: Not on file  . Food insecurity    Worry: Not on file    Inability: Not on file  . Transportation needs    Medical: Not on file    Non-medical: Not on file  Tobacco Use  . Smoking status: Never Smoker  . Smokeless tobacco: Never Used  Substance and Sexual Activity  . Alcohol use: No    Alcohol/week: 0.0 standard drinks  . Drug use: No  . Sexual activity: Not on file  Lifestyle  . Physical activity    Days per week: Not on file    Minutes per session: Not on file  . Stress: Not on file  Relationships  . Social Herbalist on phone: Not on file    Gets together: Not on file    Attends religious service: Not on file    Active member of club or organization: Not on file    Attends meetings of clubs or organizations: Not on file    Relationship status: Not on file  . Intimate partner violence    Fear of current or ex partner: Not on file    Emotionally abused: Not on file    Physically abused: Not on file    Forced sexual activity: Not on file  Other Topics Concern  . Not on file  Social History Narrative   No regular exercise     BP 134/74   Pulse 63   Temp 98.5 F (36.9 C)   Ht 6\' 2"  (1.88 m)   Wt 272 lb (123.4 kg)   SpO2 97%    BMI 34.92 kg/m   Physical Exam:  Well appearing NAD HEENT: Unremarkable Neck:  No JVD, no thyromegally Lymphatics:  No adenopathy Back:  No CVA tenderness Lungs:  Clear with no wheezes HEART:  IRegular rate rhythm, no murmurs, no rubs, no clicks Abd:  soft, positive bowel sounds, no organomegally, no rebound, no guarding Ext:  2 plus pulses, no edema, no cyanosis, no clubbing Skin:  No rashes no nodules Neuro:  CN II through XII intact, motor grossly intact  EKG - atrial fib with a controlled VR   Assess/Plan: 1. Chronic atrial fib - his VR is well controlled. He will continue his current meds. 2. Coags - he will continue warfarin 3. HTN - his blood pressure is well controlled. 4. Obesity - his weight is controlled. He is encouraged to lose more.  Mikle [email or web address].D.

## 2019-09-03 ENCOUNTER — Other Ambulatory Visit: Payer: Self-pay | Admitting: Student

## 2019-09-03 DIAGNOSIS — I5032 Chronic diastolic (congestive) heart failure: Secondary | ICD-10-CM

## 2019-09-10 ENCOUNTER — Ambulatory Visit (INDEPENDENT_AMBULATORY_CARE_PROVIDER_SITE_OTHER): Payer: Medicare Other | Admitting: *Deleted

## 2019-09-10 ENCOUNTER — Other Ambulatory Visit: Payer: Self-pay

## 2019-09-10 DIAGNOSIS — I480 Paroxysmal atrial fibrillation: Secondary | ICD-10-CM

## 2019-09-10 DIAGNOSIS — Z5181 Encounter for therapeutic drug level monitoring: Secondary | ICD-10-CM

## 2019-09-10 LAB — POCT INR: INR: 1.8 — AB (ref 2.0–3.0)

## 2019-09-10 NOTE — Patient Instructions (Signed)
Increase dose to 1 tablet daily except 1/2 tablet on Tuesdays and Fridays  Recheck INR in 6 weeks

## 2019-09-23 DIAGNOSIS — G894 Chronic pain syndrome: Secondary | ICD-10-CM | POA: Diagnosis not present

## 2019-09-23 DIAGNOSIS — Z0000 Encounter for general adult medical examination without abnormal findings: Secondary | ICD-10-CM | POA: Diagnosis not present

## 2019-09-23 DIAGNOSIS — Z1389 Encounter for screening for other disorder: Secondary | ICD-10-CM | POA: Diagnosis not present

## 2019-09-23 DIAGNOSIS — E6609 Other obesity due to excess calories: Secondary | ICD-10-CM | POA: Diagnosis not present

## 2019-09-23 DIAGNOSIS — Z6835 Body mass index (BMI) 35.0-35.9, adult: Secondary | ICD-10-CM | POA: Diagnosis not present

## 2019-09-23 DIAGNOSIS — N400 Enlarged prostate without lower urinary tract symptoms: Secondary | ICD-10-CM | POA: Diagnosis not present

## 2019-10-03 ENCOUNTER — Ambulatory Visit: Payer: Medicare Other | Attending: Internal Medicine

## 2019-10-03 ENCOUNTER — Other Ambulatory Visit: Payer: Self-pay

## 2019-10-03 DIAGNOSIS — Z20822 Contact with and (suspected) exposure to covid-19: Secondary | ICD-10-CM

## 2019-10-03 DIAGNOSIS — Z20828 Contact with and (suspected) exposure to other viral communicable diseases: Secondary | ICD-10-CM | POA: Diagnosis not present

## 2019-10-04 LAB — NOVEL CORONAVIRUS, NAA: SARS-CoV-2, NAA: NOT DETECTED

## 2019-10-15 ENCOUNTER — Other Ambulatory Visit: Payer: Self-pay | Admitting: Internal Medicine

## 2019-10-15 DIAGNOSIS — I5032 Chronic diastolic (congestive) heart failure: Secondary | ICD-10-CM

## 2019-10-22 ENCOUNTER — Ambulatory Visit (INDEPENDENT_AMBULATORY_CARE_PROVIDER_SITE_OTHER): Payer: Medicare Other | Admitting: *Deleted

## 2019-10-22 ENCOUNTER — Other Ambulatory Visit: Payer: Self-pay

## 2019-10-22 DIAGNOSIS — Z5181 Encounter for therapeutic drug level monitoring: Secondary | ICD-10-CM

## 2019-10-22 DIAGNOSIS — I480 Paroxysmal atrial fibrillation: Secondary | ICD-10-CM | POA: Diagnosis not present

## 2019-10-22 LAB — POCT INR: INR: 2.9 (ref 2.0–3.0)

## 2019-10-22 NOTE — Patient Instructions (Signed)
Continue warfarin 1 tablet daily except 1/2 tablet on Tuesdays and Fridays ?Recheck INR in 6 weeks.  ?

## 2019-11-14 ENCOUNTER — Other Ambulatory Visit: Payer: Self-pay

## 2019-11-18 ENCOUNTER — Other Ambulatory Visit (HOSPITAL_COMMUNITY)
Admission: RE | Admit: 2019-11-18 | Discharge: 2019-11-18 | Disposition: A | Discharge status: Home / Self Care | Payer: Medicare Other | Source: Ambulatory Visit | Attending: Urology | Admitting: Urology

## 2019-11-18 ENCOUNTER — Ambulatory Visit (INDEPENDENT_AMBULATORY_CARE_PROVIDER_SITE_OTHER): Payer: Medicare Other | Admitting: Urology

## 2019-11-18 ENCOUNTER — Encounter: Payer: Self-pay | Admitting: Urology

## 2019-11-18 ENCOUNTER — Other Ambulatory Visit: Payer: Self-pay

## 2019-11-18 VITALS — BP 150/88 | HR 73 | Temp 96.4°F | Ht 74.0 in | Wt 270.0 lb

## 2019-11-18 DIAGNOSIS — N3020 Other chronic cystitis without hematuria: Secondary | ICD-10-CM | POA: Diagnosis not present

## 2019-11-18 DIAGNOSIS — R351 Nocturia: Secondary | ICD-10-CM

## 2019-11-18 DIAGNOSIS — N401 Enlarged prostate with lower urinary tract symptoms: Secondary | ICD-10-CM | POA: Diagnosis not present

## 2019-11-18 DIAGNOSIS — R3915 Urgency of urination: Secondary | ICD-10-CM

## 2019-11-18 DIAGNOSIS — Z87440 Personal history of urinary (tract) infections: Secondary | ICD-10-CM | POA: Insufficient documentation

## 2019-11-18 DIAGNOSIS — R350 Frequency of micturition: Secondary | ICD-10-CM | POA: Insufficient documentation

## 2019-11-18 DIAGNOSIS — R972 Elevated prostate specific antigen [PSA]: Secondary | ICD-10-CM | POA: Insufficient documentation

## 2019-11-18 LAB — POCT URINALYSIS DIPSTICK
Bilirubin, UA: NEGATIVE
Glucose, UA: NEGATIVE
Ketones, UA: NEGATIVE
Nitrite, UA: POSITIVE
Protein, UA: POSITIVE — AB
Spec Grav, UA: 1.01 (ref 1.010–1.025)
Urobilinogen, UA: 0.2 E.U./dL
pH, UA: 6 (ref 5.0–8.0)

## 2019-11-18 NOTE — Addendum Note (Signed)
Addended byIris Pert on: 11/18/2019 09:36 AM   Modules accepted: Orders

## 2019-11-18 NOTE — Progress Notes (Signed)
H&P  Chief Complaint: Elevated PSA  History of Present Illness:   2.2.2021: This patient has been previously seen by our office for recurrent cystitis/prostatitis, but presents today on referral from his primary doctor for a recently elevated PSA (6.2 on 12.8.2020). He was previously seen by Dr Risa Grill (and in the interim Jiles Crocker) for recurrent infections. He denies having been treated for any UTI's recently and does not believe he has had any recent UTI-like sx's. He denies any blood per urine. He denies any family hx of prostate cancer or BPH requirement treatment.  IPSS Questionnaire (AUA-7): Over the past month.   1)  How often have you had a sensation of not emptying your bladder completely after you finish urinating?  2 - Less than half the time  2)  How often have you had to urinate again less than two hours after you finished urinating? 3 - About half the time  3)  How often have you found you stopped and started again several times when you urinated?  3 - About half the time  4) How difficult have you found it to postpone urination?  2 - Less than half the time  5) How often have you had a weak urinary stream?  1 - Less than 1 time in 5  6) How often have you had to push or strain to begin urination?  1 - Less than 1 time in 5  7) How many times did you most typically get up to urinate from the time you went to bed until the time you got up in the morning?  2 - 2 times  Total score:  0-7 mildly symptomatic   8-19 moderately symptomatic   20-35 severely symptomatic  IPSS: 14 QoL: 2  (below copied from [place] records):  PSA on 11.30.2015 was 4.28  8.20.2020:   Brent Mason presents today for routine follow-up. He is currently 74 years of age. I last saw him approximately 2 years ago. He was last seen in our office by Jiles Crocker back in September 2016.   Brent Mason has the following history:  (Dr Risa Grill) I originally saw him in the fall of 2014 with issues with  recurrent/chronic cystitis or mild prostatitis. At that time, he had historically had several cultures positive for Klebsiella pneumonia. He had a repeat culture here, which again showed a positive culture for Klebsiella pneumonia and he was treated with a course of ciprofloxacin and then put on some daily suppressive antibiotics. The patient had had CT imaging in 2013, which had failed to reveal any evidence of obstruction, renal calculi, or other problems. Postvoid residuals have been relatively negligible. The patient also underwent cystoscopy and other than mild visual obstruction, had no concerning findings. He fortunately has not been terribly ill from his recurrent infections, but continues to have chronic/persistent urinary tract infections with Klebsiella pneumonia. The etiology for this persistent bacteruria is unclear.    He has continued to have a fairly pan-sensitive Klebsiella pneumonia, which is a little unusual given continued antibiotic use. His most recent culture in October showed sensitivity to most antibiotics with some resistance to ampicillin and nitrofurantoin. Normally, we would expect with 6 or 7 recurrent infections with the same organism, a pattern of marked resistance.   March 2018: He continues to be without significant symptoms. He has some frequency in the morning after taking a diuretic. Nocturia is generally 1 time per night. No urgency. He reports a relatively good and steady urine stream.  He denies dysuria, hematuria, or any other systemic complaints. His overall health is relatively unchanged, although he did have some foot surgery within the past 12 months. Urinalysis today continues to show chronic pyuria and bacteriuria.   May 2019: Last urine c/s again positive for Klebsiella. Due to the absence of symptoms suggestive of acute infection, antimicrobials were held. He reports since last OV undergoing a right knee replacement. He's also made efforts at changing diet and  increasing exercise routine. He reports losing 27lbs in the past year. The patient reports no changes in voiding symptoms. He still has morning and early afternoon frequency/urgency attributed to diuretic use which improves by evening time. Denies problems with urine stream. Nocturia is stable at 1-2 times per night. He does not leak. Denies dysuria or gross hematuria. Denies any interval treatment for UTI or prostatitis.   August 2020: Pt. presents for overdue annual OV. His wife passed away earlier this month after a long battle with diabetes and dementia. Patient is actively grieving as a result of this. At baseline he continues to have daytime frequency and urgency that resolves by mid afternoon and is directly correlated with some ongoing diuretic use. Nocturia stable 1. He is not having incontinence. Denies new problems starting or stopping his stream. Denies dysuria or gross hematuria. Urinalysis today appears infected but this is a chronic issue with him and he is not exhibiting any signs or symptoms of infection.    Past Medical History:  Diagnosis Date  . Anemia   . Anticoagulated on Coumadin    long-term  . Arrhythmia   . Bleeding disorder ([place])   . Chronic kidney disease    frequent uti's last uti few yrs ago  . Diastolic CHF, chronic ([place])   . Dysrhythmia   . GERD (gastroesophageal reflux disease)   . Headache   . Heart disease   . Hiatal hernia   . History of adenomatous polyp of colon   . History of esophageal stricture    s/p  dilatations  . History of GI bleed    03/ 2003  per EGD gastritis  . HOH (hard of hearing)    both ears  . Hypertension   . Non-rheumatic aortic stenosis    per last echo 12-14-2015    . OA (osteoarthritis)    oa  . Persistent atrial fibrillation [facility]) first dx  1999   primary cardiologist-  dr taylor-- hx DCCV  . Pneumonia 2009   with cpap usage  . Shortness of breath    when exertion  . Sleep apnea    last sleep study > 5 yrs   no cpap  used    Past Surgical History:  Procedure Laterality Date  . ABDOMINAL HERNIA REPAIR  1990s  . ANTERIOR CERVICAL DECOMP/DISCECTOMY FUSION N/A 02/14/2013   Procedure: ANTERIOR CERVICAL DECOMPRESSION/DISCECTOMY FUSION 2 LEVELS;  Surgeon: Charlie Pitter, MD;  Location: [facility];  Service: Neurosurgery;  Laterality: N/A;  . BALLOON DILATION N/A 12/11/2017   Procedure: BALLOON DILATION;  Surgeon: Clarene Essex, MD;  [facility]: [facility];  Service: Endoscopy;  Laterality: N/A;  . COLONOSCOPY  last one 06-03-2012  . COLONOSCOPY  06/03/2012   Procedure: COLONOSCOPY;  Surgeon: Daneil Dolin, MD;  [facility];  Service: Endoscopy;  Laterality: N/A;  8:30  . esophageal strecching  yrs ago  . ESOPHAGOGASTRODUODENOSCOPY (EGD) WITH PROPOFOL N/A 12/11/2017   Procedure: ESOPHAGOGASTRODUODENOSCOPY (EGD) WITH PROPOFOL;  Surgeon: Clarene Essex, MD;  [facility];  Service: Endoscopy;  Laterality: N/A;  . ESOPHAGUS SURGERY  20 yrs ago   for food sticking  . FOOT SURGERY Right 04-27-2010   dr duda   joint arthrodesis  . right foot surgery  2018   to remove screw  . TOTAL KNEE ARTHROPLASTY Right 12/07/2017   Procedure: RIGHT TOTAL KNEE ARTHROPLASTY;  Surgeon: Mcarthur Rossetti, MD;  Location: [facility];  Service: Orthopedics;  Laterality: Right;  . TRANSTHORACIC ECHOCARDIOGRAM  12-14-2015  dr taylor   moderate LVH, ef 55-60%, indeterminate diastolic function in setting of AFib/  moderate LAE and RAE/  mild to moderate calcific aoric stenosis with mild AR (valve area 1.33cm^2, peak grandient51mmHg, mean grandient 41mmHg)/ trivial MR/ mild TR/ PASP 71mmHg  . VOLVULUS REDUCTION  [id number]    Home Medications:  Allergies as of 11/18/2019   No Known Allergies     Medication List       Accurate as of November 18, 2019  9:19 AM. If you have any questions, ask your nurse or doctor.        STOP taking these medications   oxyCODONE 5 MG immediate release tablet Commonly known as:  Roxicodone Stopped by: Jorja Loa, MD   polyethylene glycol 17 g packet Commonly known as: MIRALAX / GLYCOLAX Stopped by: Jorja Loa, MD     TAKE these medications   atorvastatin 20 MG tablet Commonly known as: LIPITOR Take 20 mg by mouth daily. What changed: Another medication with the same name was removed. Continue taking this medication, and follow the directions you see here. Changed by: Jorja Loa, MD   carvedilol 3.125 MG tablet Commonly known as: COREG TAKE (1) TABLET BY MOUTH TWICE DAILY.   Co-Enzyme Q10 100 MG Caps Take by mouth.   ferrous sulfate 325 (65 FE) MG tablet Take 325 mg by mouth daily with breakfast.   Fish Oil 1000 MG Caps Take by mouth.   fluticasone 50 MCG/ACT nasal spray Commonly known as: FLONASE Place 1 spray into the nose daily.   furosemide 40 MG tablet Commonly known as: LASIX TAKE (1) TABLET BY MOUTH ONCE DAILY.   HYDROcodone-acetaminophen 5-325 MG tablet Commonly known as: NORCO/VICODIN   losartan 100 MG tablet Commonly known as: COZAAR Take 100 mg by mouth daily.   methocarbamol 500 MG tablet Commonly known as: ROBAXIN Take 1 tablet (500 mg total) by mouth every 6 (six) hours as needed for muscle spasms.   omeprazole 20 MG capsule Commonly known as: PRILOSEC Take 20 mg by mouth at bedtime.   warfarin 5 MG tablet Commonly known as: COUMADIN Take as directed by the anticoagulation clinic. If you are unsure how to take this medication, talk to your nurse or doctor. Original instructions: Take 1 tablet daily except 1/2 tablet on Sundays, Tuesdays and Thursdays       Allergies: No Known Allergies  Family History  Problem Relation Age of Onset  . Heart disease Mother        Pacemaker  . Diabetes Father   . Kidney failure Father   . Colon cancer Neg Hx     Social History:  reports that he has never smoked. He has never used smokeless tobacco. He reports that he does not drink alcohol or use  drugs.  ROS: A complete review of systems was performed.  All systems are negative except for pertinent findings as noted.  (completed by [facility], LPN)  Urological Symptom Review  Patient is experiencing the following symptoms:  Frequent  urination  Hard to postpone urination  Get up at night to urinate  Leakage of urine  Stream starts and stops  Review of Systems  Gastrointestinal (upper) :  Negative for upper GI symptoms  Gastrointestinal (lower) :  Constipation  Constitutional :  Negative for symptoms  Skin:  Negative for skin symptoms  Eyes:  Negative for eye symptoms  Ear/Nose/Throat :  Negative for Ear/Nose/Throat symptoms  Hematologic/Lymphatic:  Negative for Hematologic/Lymphatic symptoms  Cardiovascular :  Negative for cardiovascular symptoms  Respiratory :  Cough  Shortness of breath  Endocrine:  Negative for endocrine symptoms  Musculoskeletal:  Joint pain  Neurological:  Negative for neurological symptoms  Psychologic:  Negative for psychiatric symptoms     Physical Exam:  Vital signs in last 24 hours: BP (!) 150/88   Pulse 73   Temp (!) 96.4 F (35.8 C)   Ht 6\' 2"  (1.88 m)   Wt 270 lb (122.5 kg)   BMI 34.67 kg/m  Constitutional:  Alert and oriented, No acute distress Cardiovascular: Regular rate  Respiratory: Labored breathing. GI: Abdomen is soft, nontender, nondistended, no abdominal masses. No CVAT. Incisional hernias (midline), no inguinal hernias. Genitourinary: Normal male phallus (uncircumcised), testes are descended bilaterally and non-tender and without masses, scrotum is normal in appearance without lesions or masses, perineum is normal on inspection. Prostate feels 80 grams in size. Lymphatic: No lymphadenopathy Neurologic: Grossly intact, no focal deficits Psychiatric: Normal mood and affect  Laboratory Data:  No results for input(s): WBC, HGB, HCT, PLT in the last 72 hours.  No results for input(s): NA, K, CL, GLUCOSE, BUN,  CALCIUM, CREATININE in the last 72 hours.  Invalid input(s): CO3   No results found for this or any previous visit (from the past 24 hour(s)). No results found for this or any previous visit (from the past 240 hour(s)).  Renal Function: No results for input(s): CREATININE in the last 168 hours. CrCl cannot be calculated (Patient's most recent lab result is older than the maximum 21 days allowed.).  Radiologic Imaging: No results found.  Impression/Assessment:  His PSA is elevated from 4.3 in 2015 up to 6.2 now (Dec 2020). This recently elevated measurement could be the result of a recent infection or another benign cause -- especially because he has a hx of recurrent bladder/prostate infections. Will culture and, if he is POS for infection, will have him return for repeat PSA after being treated with course of abx. Prostate exam normal with appropriately large gland given range of his PSA's.  Plan:  1. Repeat PSA -- will forward results.  2. Culture urine -- will start on abx if needed and repeat PSA after infection cleared  3. Will plan appropriate follow-up pending results.   cc: Dr Gerarda Fraction

## 2019-11-18 NOTE — Progress Notes (Signed)
Urological Symptom Review  Patient is experiencing the following symptoms: Frequent urination Hard to postpone urination Get up at night to urinate Leakage of urine Stream starts and stops   Review of Systems  Gastrointestinal (upper)  : Negative for upper GI symptoms  Gastrointestinal (lower) : Constipation  Constitutional : Negative for symptoms  Skin: Negative for skin symptoms  Eyes: Negative for eye symptoms  Ear/Nose/Throat : Negative for Ear/Nose/Throat symptoms  Hematologic/Lymphatic: Negative for Hematologic/Lymphatic symptoms  Cardiovascular : Negative for cardiovascular symptoms  Respiratory : Cough Shortness of breath  Endocrine: Negative for endocrine symptoms  Musculoskeletal: Joint pain  Neurological: Negative for neurological symptoms  Psychologic: Negative for psychiatric symptoms

## 2019-11-19 ENCOUNTER — Other Ambulatory Visit: Payer: Self-pay

## 2019-11-19 ENCOUNTER — Telehealth: Payer: Self-pay

## 2019-11-19 LAB — PSA: PSA: 4.2 ng/mL — ABNORMAL HIGH (ref <=4.0)

## 2019-11-19 NOTE — Telephone Encounter (Signed)
-----   Message from Franchot Gallo, MD sent at 11/19/2019 10:26 AM EST -----  Notify patient that PSA was 4.2, normal for him. ----- Message ----- From: Dorisann Frames, RN Sent: 11/19/2019   8:28 AM EST To: Franchot Gallo, MD  Please review

## 2019-11-19 NOTE — Telephone Encounter (Signed)
Daughter notified of PSA result.

## 2019-11-20 LAB — URINE CULTURE: Culture: >=100000 — AB

## 2019-11-21 ENCOUNTER — Other Ambulatory Visit: Payer: Self-pay | Admitting: Internal Medicine

## 2019-11-24 ENCOUNTER — Telehealth: Payer: Self-pay

## 2019-11-24 ENCOUNTER — Other Ambulatory Visit: Payer: Self-pay | Admitting: Urology

## 2019-11-24 DIAGNOSIS — N3000 Acute cystitis without hematuria: Secondary | ICD-10-CM

## 2019-11-24 MED ORDER — CEPHALEXIN 500 MG PO CAPS: 500.0000 mg | ORAL_CAPSULE | Freq: Two times a day (BID) | ORAL | 0 refills | Status: DC

## 2019-11-24 NOTE — Telephone Encounter (Signed)
-----   Message from Franchot Gallo, MD sent at 11/24/2019  9:08 AM EST ----- Notify pt--culture + keflex sent in ----- Message ----- From: Dorisann Frames, RN Sent: 11/21/2019   3:35 PM EST To: Franchot Gallo, MD  Urine culture

## 2019-11-24 NOTE — Telephone Encounter (Signed)
Daughter notified 

## 2019-12-03 ENCOUNTER — Other Ambulatory Visit: Payer: Self-pay

## 2019-12-03 ENCOUNTER — Ambulatory Visit (INDEPENDENT_AMBULATORY_CARE_PROVIDER_SITE_OTHER): Payer: Medicare Other | Admitting: *Deleted

## 2019-12-03 DIAGNOSIS — I480 Paroxysmal atrial fibrillation: Secondary | ICD-10-CM | POA: Diagnosis not present

## 2019-12-03 DIAGNOSIS — Z5181 Encounter for therapeutic drug level monitoring: Secondary | ICD-10-CM

## 2019-12-03 LAB — POCT INR: INR: 2.6 (ref 2.0–3.0)

## 2019-12-03 NOTE — Patient Instructions (Signed)
Continue warfarin 1 tablet daily except 1/2 tablet on Tuesdays and Fridays ?Recheck INR in 6 weeks.  ?

## 2019-12-15 ENCOUNTER — Other Ambulatory Visit: Payer: Self-pay | Admitting: Internal Medicine

## 2019-12-15 DIAGNOSIS — I5032 Chronic diastolic (congestive) heart failure: Secondary | ICD-10-CM

## 2020-01-13 ENCOUNTER — Other Ambulatory Visit: Payer: Self-pay

## 2020-01-13 ENCOUNTER — Ambulatory Visit (INDEPENDENT_AMBULATORY_CARE_PROVIDER_SITE_OTHER): Payer: Medicare Other | Admitting: *Deleted

## 2020-01-13 DIAGNOSIS — Z5181 Encounter for therapeutic drug level monitoring: Secondary | ICD-10-CM | POA: Diagnosis not present

## 2020-01-13 DIAGNOSIS — I480 Paroxysmal atrial fibrillation: Secondary | ICD-10-CM

## 2020-01-13 LAB — POCT INR: INR: 2.2 (ref 2.0–3.0)

## 2020-01-13 NOTE — Patient Instructions (Signed)
Continue warfarin 1 tablet daily except 1/2 tablet on Tuesdays and Fridays ?Recheck INR in 6 weeks.  ?

## 2020-01-28 DIAGNOSIS — G894 Chronic pain syndrome: Secondary | ICD-10-CM | POA: Diagnosis not present

## 2020-01-28 DIAGNOSIS — I10 Essential (primary) hypertension: Secondary | ICD-10-CM | POA: Diagnosis not present

## 2020-01-28 DIAGNOSIS — Z6837 Body mass index (BMI) 37.0-37.9, adult: Secondary | ICD-10-CM | POA: Diagnosis not present

## 2020-02-24 ENCOUNTER — Other Ambulatory Visit: Payer: Self-pay

## 2020-02-24 ENCOUNTER — Ambulatory Visit (INDEPENDENT_AMBULATORY_CARE_PROVIDER_SITE_OTHER): Payer: Medicare Other | Admitting: *Deleted

## 2020-02-24 ENCOUNTER — Other Ambulatory Visit: Payer: Self-pay | Admitting: Internal Medicine

## 2020-02-24 DIAGNOSIS — I480 Paroxysmal atrial fibrillation: Secondary | ICD-10-CM

## 2020-02-24 DIAGNOSIS — Z5181 Encounter for therapeutic drug level monitoring: Secondary | ICD-10-CM

## 2020-02-24 LAB — POCT INR: INR: 2.4 (ref 2.0–3.0)

## 2020-02-24 NOTE — Patient Instructions (Signed)
Continue warfarin 1 tablet daily except 1/2 tablet on Tuesdays and Fridays ?Recheck INR in 6 weeks.  ?

## 2020-02-24 NOTE — Telephone Encounter (Signed)
Please review for refill. Thanks!  

## 2020-03-19 ENCOUNTER — Other Ambulatory Visit: Payer: Self-pay | Admitting: Internal Medicine

## 2020-04-06 ENCOUNTER — Other Ambulatory Visit: Payer: Self-pay

## 2020-04-06 ENCOUNTER — Ambulatory Visit (INDEPENDENT_AMBULATORY_CARE_PROVIDER_SITE_OTHER): Payer: Medicare Other | Admitting: *Deleted

## 2020-04-06 DIAGNOSIS — I480 Paroxysmal atrial fibrillation: Secondary | ICD-10-CM | POA: Diagnosis not present

## 2020-04-06 DIAGNOSIS — Z5181 Encounter for therapeutic drug level monitoring: Secondary | ICD-10-CM | POA: Diagnosis not present

## 2020-04-06 LAB — POCT INR: INR: 2.3 (ref 2.0–3.0)

## 2020-04-06 NOTE — Patient Instructions (Signed)
Continue warfarin 1 tablet daily except 1/2 tablet on Tuesdays and Fridays ?Recheck INR in 6 weeks.  ?

## 2020-04-08 DIAGNOSIS — G894 Chronic pain syndrome: Secondary | ICD-10-CM | POA: Diagnosis not present

## 2020-04-08 DIAGNOSIS — Z6837 Body mass index (BMI) 37.0-37.9, adult: Secondary | ICD-10-CM | POA: Diagnosis not present

## 2020-04-08 DIAGNOSIS — I10 Essential (primary) hypertension: Secondary | ICD-10-CM | POA: Diagnosis not present

## 2020-04-08 DIAGNOSIS — M1991 Primary osteoarthritis, unspecified site: Secondary | ICD-10-CM | POA: Diagnosis not present

## 2020-04-20 ENCOUNTER — Other Ambulatory Visit: Payer: Self-pay | Admitting: Internal Medicine

## 2020-04-20 NOTE — Telephone Encounter (Signed)
Prescription Refilled

## 2020-05-14 ENCOUNTER — Other Ambulatory Visit: Payer: Self-pay | Admitting: Internal Medicine

## 2020-05-18 ENCOUNTER — Ambulatory Visit (INDEPENDENT_AMBULATORY_CARE_PROVIDER_SITE_OTHER): Payer: Medicare Other | Admitting: *Deleted

## 2020-05-18 DIAGNOSIS — Z0120 Encounter for dental examination and cleaning without abnormal findings: Secondary | ICD-10-CM | POA: Diagnosis not present

## 2020-05-18 DIAGNOSIS — I480 Paroxysmal atrial fibrillation: Secondary | ICD-10-CM | POA: Diagnosis not present

## 2020-05-18 DIAGNOSIS — Z5181 Encounter for therapeutic drug level monitoring: Secondary | ICD-10-CM | POA: Diagnosis not present

## 2020-05-18 LAB — POCT INR: INR: 2.4 (ref 2.0–3.0)

## 2020-05-18 NOTE — Patient Instructions (Signed)
Continue warfarin 1 tablet daily except 1/2 tablet on Tuesdays and Fridays ?Recheck INR in 6 weeks.  ?

## 2020-06-04 DIAGNOSIS — G894 Chronic pain syndrome: Secondary | ICD-10-CM | POA: Diagnosis not present

## 2020-06-04 DIAGNOSIS — Z6835 Body mass index (BMI) 35.0-35.9, adult: Secondary | ICD-10-CM | POA: Diagnosis not present

## 2020-06-04 DIAGNOSIS — R208 Other disturbances of skin sensation: Secondary | ICD-10-CM | POA: Diagnosis not present

## 2020-06-04 DIAGNOSIS — E6609 Other obesity due to excess calories: Secondary | ICD-10-CM | POA: Diagnosis not present

## 2020-06-29 ENCOUNTER — Other Ambulatory Visit: Payer: Self-pay | Admitting: Internal Medicine

## 2020-06-29 NOTE — Telephone Encounter (Signed)
This is a [place] pt.  °

## 2020-07-01 ENCOUNTER — Other Ambulatory Visit: Payer: Self-pay

## 2020-07-01 ENCOUNTER — Ambulatory Visit (INDEPENDENT_AMBULATORY_CARE_PROVIDER_SITE_OTHER): Payer: Medicare Other | Admitting: *Deleted

## 2020-07-01 DIAGNOSIS — Z5181 Encounter for therapeutic drug level monitoring: Secondary | ICD-10-CM

## 2020-07-01 DIAGNOSIS — I480 Paroxysmal atrial fibrillation: Secondary | ICD-10-CM | POA: Diagnosis not present

## 2020-07-01 LAB — POCT INR: INR: 2.3 (ref 2.0–3.0)

## 2020-07-01 NOTE — Patient Instructions (Signed)
Continue warfarin 1 tablet daily except 1/2 tablet on Tuesdays and Fridays ?Recheck INR in 6 weeks.  ?

## 2020-07-20 DIAGNOSIS — R0602 Shortness of breath: Secondary | ICD-10-CM | POA: Diagnosis not present

## 2020-07-20 DIAGNOSIS — I4891 Unspecified atrial fibrillation: Secondary | ICD-10-CM | POA: Diagnosis not present

## 2020-07-20 DIAGNOSIS — Z6836 Body mass index (BMI) 36.0-36.9, adult: Secondary | ICD-10-CM | POA: Diagnosis not present

## 2020-07-20 DIAGNOSIS — E6609 Other obesity due to excess calories: Secondary | ICD-10-CM | POA: Diagnosis not present

## 2020-08-09 DIAGNOSIS — Z6835 Body mass index (BMI) 35.0-35.9, adult: Secondary | ICD-10-CM | POA: Diagnosis not present

## 2020-08-09 DIAGNOSIS — B009 Herpesviral infection, unspecified: Secondary | ICD-10-CM | POA: Diagnosis not present

## 2020-08-09 DIAGNOSIS — E6609 Other obesity due to excess calories: Secondary | ICD-10-CM | POA: Diagnosis not present

## 2020-08-09 DIAGNOSIS — L0109 Other impetigo: Secondary | ICD-10-CM | POA: Diagnosis not present

## 2020-08-12 ENCOUNTER — Ambulatory Visit (INDEPENDENT_AMBULATORY_CARE_PROVIDER_SITE_OTHER): Payer: Medicare Other | Admitting: *Deleted

## 2020-08-12 DIAGNOSIS — I480 Paroxysmal atrial fibrillation: Secondary | ICD-10-CM

## 2020-08-12 DIAGNOSIS — Z5181 Encounter for therapeutic drug level monitoring: Secondary | ICD-10-CM | POA: Diagnosis not present

## 2020-08-12 LAB — POCT INR: INR: 2.3 (ref 2.0–3.0)

## 2020-08-12 NOTE — Patient Instructions (Signed)
Continue warfarin 1 tablet daily except 1/2 tablet on Tuesdays and Fridays ?Recheck INR in 6 weeks.  ?

## 2020-09-01 DIAGNOSIS — C4402 Squamous cell carcinoma of skin of lip: Secondary | ICD-10-CM | POA: Diagnosis not present

## 2020-09-16 DIAGNOSIS — C4402 Squamous cell carcinoma of skin of lip: Secondary | ICD-10-CM | POA: Diagnosis not present

## 2020-09-23 ENCOUNTER — Ambulatory Visit (INDEPENDENT_AMBULATORY_CARE_PROVIDER_SITE_OTHER): Payer: Medicare Other | Admitting: *Deleted

## 2020-09-23 DIAGNOSIS — I480 Paroxysmal atrial fibrillation: Secondary | ICD-10-CM | POA: Diagnosis not present

## 2020-09-23 DIAGNOSIS — Z5181 Encounter for therapeutic drug level monitoring: Secondary | ICD-10-CM | POA: Diagnosis not present

## 2020-09-23 LAB — POCT INR: INR: 1.9 — AB (ref 2.0–3.0)

## 2020-09-23 NOTE — Patient Instructions (Signed)
Take warfarin 1 1/2 tablets tonight then resume 1 tablet daily except 1/2 tablet on Tuesdays and Fridays  Recheck INR in 6 weeks

## 2020-09-24 DIAGNOSIS — Z0000 Encounter for general adult medical examination without abnormal findings: Secondary | ICD-10-CM | POA: Diagnosis not present

## 2020-09-24 DIAGNOSIS — J302 Other seasonal allergic rhinitis: Secondary | ICD-10-CM | POA: Diagnosis not present

## 2020-09-24 DIAGNOSIS — G894 Chronic pain syndrome: Secondary | ICD-10-CM | POA: Diagnosis not present

## 2020-09-24 DIAGNOSIS — Z6834 Body mass index (BMI) 34.0-34.9, adult: Secondary | ICD-10-CM | POA: Diagnosis not present

## 2020-09-27 DIAGNOSIS — L570 Actinic keratosis: Secondary | ICD-10-CM | POA: Diagnosis not present

## 2020-09-27 DIAGNOSIS — X32XXXD Exposure to sunlight, subsequent encounter: Secondary | ICD-10-CM | POA: Diagnosis not present

## 2020-09-27 DIAGNOSIS — D225 Melanocytic nevi of trunk: Secondary | ICD-10-CM | POA: Diagnosis not present

## 2020-09-27 DIAGNOSIS — Z1283 Encounter for screening for malignant neoplasm of skin: Secondary | ICD-10-CM | POA: Diagnosis not present

## 2020-10-06 ENCOUNTER — Other Ambulatory Visit: Payer: Self-pay | Admitting: Internal Medicine

## 2020-10-06 DIAGNOSIS — I5032 Chronic diastolic (congestive) heart failure: Secondary | ICD-10-CM

## 2020-10-08 ENCOUNTER — Other Ambulatory Visit: Payer: Self-pay | Admitting: Internal Medicine

## 2020-10-08 DIAGNOSIS — I5032 Chronic diastolic (congestive) heart failure: Secondary | ICD-10-CM

## 2020-10-13 ENCOUNTER — Encounter: Payer: Self-pay | Admitting: Internal Medicine

## 2020-10-13 ENCOUNTER — Other Ambulatory Visit: Payer: Self-pay

## 2020-10-13 ENCOUNTER — Ambulatory Visit: Payer: Medicare Other | Admitting: Internal Medicine

## 2020-10-13 VITALS — BP 124/86 | HR 64 | Ht 74.0 in | Wt 253.0 lb

## 2020-10-13 DIAGNOSIS — I4819 Other persistent atrial fibrillation: Secondary | ICD-10-CM | POA: Diagnosis not present

## 2020-10-13 MED ORDER — ATORVASTATIN CALCIUM 20 MG PO TABS
20.0000 mg | ORAL_TABLET | Freq: Every day | ORAL | 3 refills | Status: DC
Start: 2020-10-13 — End: 2021-10-14

## 2020-10-13 NOTE — Progress Notes (Signed)
HPI Mr. Washburnreturns today for follow-up of his atrial fibrillation, hypertension, dyslipidemia, and obesity. In the interim, he notes he has lost 17 lbs since his last visit. He denies chest pain. His daughter notes that he will at times have some wheezing sounds. He denies using salt in excess and he weighs himself daily. He still misses his wife who died over a year ago. No Known Allergies   Current Outpatient Medications  Medication Sig Dispense Refill  . atorvastatin (LIPITOR) 20 MG tablet Take 20 mg by mouth daily.    . carvedilol (COREG) 3.125 MG tablet TAKE (1) TABLET BY MOUTH TWICE DAILY. 180 tablet 3  . Co-Enzyme Q10 100 MG CAPS Take by mouth.    . ferrous sulfate 325 (65 FE) MG tablet Take 325 mg by mouth daily with breakfast.    . fluticasone (FLONASE) 50 MCG/ACT nasal spray Place 1 spray into the nose daily.     . furosemide (LASIX) 40 MG tablet TAKE (1) TABLET BY MOUTH ONCE DAILY. 90 tablet 0  . HYDROcodone-acetaminophen (NORCO/VICODIN) 5-325 MG tablet     . losartan (COZAAR) 100 MG tablet Take 100 mg by mouth daily.    . methocarbamol (ROBAXIN) 500 MG tablet Take 1 tablet (500 mg total) by mouth every 6 (six) hours as needed for muscle spasms. 60 tablet 0  . Omega-3 Fatty Acids (FISH OIL) 1000 MG CAPS Take by mouth.    Marland Kitchen omeprazole (PRILOSEC) 20 MG capsule Take 20 mg by mouth at bedtime.    Marland Kitchen warfarin (COUMADIN) 5 MG tablet TAKE 1 TABLET BY MOUTH DAILY, EXCEPT 1/2 TABLET ON TUESDAYS AND FRIDAYS. 35 tablet 4   No current facility-administered medications for this visit.     Past Medical History:  Diagnosis Date  . Anemia   . Anticoagulated on Coumadin    long-term  . Arrhythmia   . Bleeding disorder ([facility])   . Chronic kidney disease    frequent uti's last uti few yrs ago  . Diastolic CHF, chronic ([facility])   . Dysrhythmia   . GERD (gastroesophageal reflux disease)   . Headache   . Heart disease   . Hiatal hernia   . History of adenomatous polyp of colon    . History of esophageal stricture    s/p  dilatations  . History of GI bleed    03/ 2003  per EGD gastritis  . HOH (hard of hearing)    both ears  . Hypertension   . Non-rheumatic aortic stenosis    per last echo 12-14-2015    . OA (osteoarthritis)    oa  . Persistent atrial fibrillation [facility]) first dx  1999   primary cardiologist-  dr Ameyah Bangura-- hx DCCV  . Pneumonia 2009   with cpap usage  . Shortness of breath    when exertion  . Sleep apnea    last sleep study > 5 yrs   no cpap used    ROS:   All systems reviewed and negative except as noted in the HPI.   Past Surgical History:  Procedure Laterality Date  . ABDOMINAL HERNIA REPAIR  1990s  . ANTERIOR CERVICAL DECOMP/DISCECTOMY FUSION N/A 02/14/2013   Procedure: ANTERIOR CERVICAL DECOMPRESSION/DISCECTOMY FUSION 2 LEVELS;  Surgeon: Temple Pacini, MD;  Location: [facility];  Service: Neurosurgery;  Laterality: N/A;  . BALLOON DILATION N/A 12/11/2017   Procedure: BALLOON DILATION;  Surgeon: Vida Rigger, MD;  [facility];  Service: Endoscopy;  Laterality: N/A;  .  COLONOSCOPY  last one 06-03-2012  . COLONOSCOPY  06/03/2012   Procedure: COLONOSCOPY;  Surgeon: Daneil Dolin, MD;  [facility];  Service: Endoscopy;  Laterality: N/A;  8:30  . esophageal strecching  yrs ago  . ESOPHAGOGASTRODUODENOSCOPY (EGD) WITH PROPOFOL N/A 12/11/2017   Procedure: ESOPHAGOGASTRODUODENOSCOPY (EGD) WITH PROPOFOL;  Surgeon: Clarene Essex, MD;  [facility];  Service: Endoscopy;  Laterality: N/A;  . ESOPHAGUS SURGERY  20 yrs ago   for food sticking  . FOOT SURGERY Right 04-27-2010   dr duda   joint arthrodesis  . right foot surgery  2018   to remove screw  . TOTAL KNEE ARTHROPLASTY Right 12/07/2017   Procedure: RIGHT TOTAL KNEE ARTHROPLASTY;  Surgeon: Mcarthur Rossetti, MD;  Location: [facility];  Service: Orthopedics;  Laterality: Right;  . TRANSTHORACIC ECHOCARDIOGRAM  12-14-2015  dr Bev Drennen   moderate LVH, ef  55-60%, indeterminate diastolic function in setting of AFib/  moderate LAE and RAE/  mild to moderate calcific aoric stenosis with mild AR (valve area 1.33cm^2, peak grandient45mmHg, mean grandient 15mmHg)/ trivial MR/ mild TR/ PASP 81mmHg  . VOLVULUS REDUCTION  [id number]     Family History  Problem Relation Age of Onset  . Heart disease Mother        Pacemaker  . Diabetes Father   . Kidney failure Father   . Colon cancer Neg Hx      Social History   Socioeconomic History  . Marital status: Widowed    Spouse name: Not on file  . Number of children: Not on file  . Years of education: Not on file  . Highest education level: Not on file  Occupational History  . 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 Smoking status: Never Smoker  . Smokeless tobacco: Never Used  Vaping Use  . Vaping Use: Never used  Substance and Sexual Activity  . Alcohol use: No    Alcohol/week: 0.0 standard drinks  . Drug use: No  . Sexual activity: Not on file  Other Topics Concern  . Not on file  Social History Narrative   No regular exercise   Social Determinants of Health   Financial Resource Strain: Not on file  Food Insecurity: Not on file  Transportation Needs: Not on file  Physical Activity: Not on file  Stress: Not on file  Social Connections: Not on file  Intimate Partner Violence: Not on file     BP 124/86   Pulse 64   Ht 6\' 2"  (1.88 m)   Wt 253 lb (114.8 kg)   SpO2 97%   BMI 32.48 kg/m   Physical Exam:  Well appearing NAD HEENT: Unremarkable Neck:  6 cmJVD, no thyromegally Lymphatics:  No adenopathy Back:  No CVA tenderness Lungs:  Clear with no wheezes HEART:  IRegular rate rhythm, no murmurs, no rubs, no clicks Abd:  soft, positive bowel sounds, no organomegally, no rebound, no guarding Ext:  2 plus pulses, no edema, no cyanosis, no clubbing Skin:  No rashes no nodules Neuro:  CN II through XII intact, motor grossly  intact  EKG - atrial fib with a controlled VR   Assess/Plan: 1. Chronic atrial fib - his VR is well controlled.  2. Coags - he has had no bleeding on warfarin 3. Obesity - he has lost 17 lbs and feels better. 4. Diastolic heart failure - we discussed the importance of a low sodium diet  and lasix.  Carleene Overlie Glynnis Gavel,MD

## 2020-10-13 NOTE — Patient Instructions (Signed)
Medication Instructions:  Your physician recommends that you continue on your current medications as directed. Please refer to the Current Medication list given to you today.  *If you need a refill on your cardiac medications before your next appointment, please call your pharmacy*   Lab Work: NONE   If you have labs (blood work) drawn today and your tests are completely normal, you will receive your results only by: . MyChart Message (if you have MyChart) OR . A paper copy in the mail If you have any lab test that is abnormal or we need to change your treatment, we will call you to review the results.   Testing/Procedures: NONE    Follow-Up: At [facility], you and your health needs are our priority.  As part of our continuing mission to provide you with exceptional heart care, we have created designated Provider Care Teams.  These Care Teams include your primary Cardiologist (physician) and Advanced Practice Providers (APPs -  Physician Assistants and Nurse Practitioners) who all work together to provide you with the care you need, when you need it.  We recommend signing up for the patient portal called "MyChart".  Sign up information is provided on this After Visit Summary.  MyChart is used to connect with patients for Virtual Visits (Telemedicine).  Patients are able to view lab/test results, encounter notes, upcoming appointments, etc.  Non-urgent messages can be sent to your provider as well.   To learn more about what you can do with MyChart, go to [email or web address].    Your next appointment:   1 year(s)  The format for your next appointment:   In Person  Provider:   Gregg Taylor, MD   Other Instructions Thank you for choosing [facility]!    

## 2020-10-19 DIAGNOSIS — C4402 Squamous cell carcinoma of skin of lip: Secondary | ICD-10-CM | POA: Diagnosis not present

## 2020-11-04 ENCOUNTER — Ambulatory Visit (INDEPENDENT_AMBULATORY_CARE_PROVIDER_SITE_OTHER): Payer: Medicare Other | Admitting: *Deleted

## 2020-11-04 DIAGNOSIS — Z5181 Encounter for therapeutic drug level monitoring: Secondary | ICD-10-CM | POA: Diagnosis not present

## 2020-11-04 DIAGNOSIS — I480 Paroxysmal atrial fibrillation: Secondary | ICD-10-CM

## 2020-11-04 LAB — POCT INR: INR: 2.1 (ref 2.0–3.0)

## 2020-11-04 NOTE — Patient Instructions (Signed)
Continue warfarin 1 tablet daily except 1/2 tablet on Tuesdays and Fridays ?Recheck INR in 6 weeks.  ?

## 2020-11-18 DIAGNOSIS — C4402 Squamous cell carcinoma of skin of lip: Secondary | ICD-10-CM | POA: Diagnosis not present

## 2020-12-15 ENCOUNTER — Other Ambulatory Visit: Payer: Self-pay | Admitting: Internal Medicine

## 2020-12-15 DIAGNOSIS — I5032 Chronic diastolic (congestive) heart failure: Secondary | ICD-10-CM

## 2020-12-16 ENCOUNTER — Ambulatory Visit (INDEPENDENT_AMBULATORY_CARE_PROVIDER_SITE_OTHER): Payer: Medicare Other | Admitting: *Deleted

## 2020-12-16 DIAGNOSIS — I480 Paroxysmal atrial fibrillation: Secondary | ICD-10-CM | POA: Diagnosis not present

## 2020-12-16 DIAGNOSIS — Z5181 Encounter for therapeutic drug level monitoring: Secondary | ICD-10-CM

## 2020-12-16 LAB — POCT INR: INR: 2.5 (ref 2.0–3.0)

## 2020-12-16 NOTE — Patient Instructions (Signed)
Continue warfarin 1 tablet daily except 1/2 tablet on Tuesdays and Fridays ?Recheck INR in 6 weeks.  ?

## 2020-12-24 DIAGNOSIS — Z85828 Personal history of other malignant neoplasm of skin: Secondary | ICD-10-CM | POA: Diagnosis not present

## 2020-12-24 DIAGNOSIS — L905 Scar conditions and fibrosis of skin: Secondary | ICD-10-CM | POA: Diagnosis not present

## 2021-01-18 ENCOUNTER — Ambulatory Visit: Payer: Medicare Other | Admitting: Urology

## 2021-01-27 ENCOUNTER — Ambulatory Visit (INDEPENDENT_AMBULATORY_CARE_PROVIDER_SITE_OTHER): Payer: Medicare Other | Admitting: *Deleted

## 2021-01-27 ENCOUNTER — Other Ambulatory Visit: Payer: Self-pay

## 2021-01-27 DIAGNOSIS — Z5181 Encounter for therapeutic drug level monitoring: Secondary | ICD-10-CM

## 2021-01-27 DIAGNOSIS — I480 Paroxysmal atrial fibrillation: Secondary | ICD-10-CM

## 2021-01-27 LAB — POCT INR: INR: 3 (ref 2.0–3.0)

## 2021-01-27 NOTE — Patient Instructions (Signed)
Continue warfarin 1 tablet daily except 1/2 tablet on Tuesdays and Fridays ?Recheck INR in 6 weeks.  ?

## 2021-01-31 ENCOUNTER — Other Ambulatory Visit: Payer: Self-pay | Admitting: Internal Medicine

## 2021-01-31 NOTE — Telephone Encounter (Signed)
This is a [place] pt.  °

## 2021-02-08 ENCOUNTER — Other Ambulatory Visit: Payer: Self-pay

## 2021-02-08 ENCOUNTER — Ambulatory Visit (INDEPENDENT_AMBULATORY_CARE_PROVIDER_SITE_OTHER): Payer: Medicare Other | Admitting: Urology

## 2021-02-08 ENCOUNTER — Encounter: Payer: Self-pay | Admitting: Urology

## 2021-02-08 VITALS — BP 150/75 | HR 74 | Temp 97.6°F | Wt 260.0 lb

## 2021-02-08 DIAGNOSIS — N401 Enlarged prostate with lower urinary tract symptoms: Secondary | ICD-10-CM | POA: Diagnosis not present

## 2021-02-08 DIAGNOSIS — R351 Nocturia: Secondary | ICD-10-CM

## 2021-02-08 DIAGNOSIS — R972 Elevated prostate specific antigen [PSA]: Secondary | ICD-10-CM

## 2021-02-08 DIAGNOSIS — N3000 Acute cystitis without hematuria: Secondary | ICD-10-CM | POA: Diagnosis not present

## 2021-02-08 LAB — URINALYSIS, ROUTINE W REFLEX MICROSCOPIC
Bilirubin, UA: NEGATIVE
Glucose, UA: NEGATIVE
Ketones, UA: NEGATIVE
Nitrite, UA: POSITIVE — AB
Protein,UA: NEGATIVE
Specific Gravity, UA: 1.025 (ref 1.005–1.030)
Urobilinogen, Ur: 0.2 mg/dL (ref 0.2–1.0)
pH, UA: 5 (ref 5.0–7.5)

## 2021-02-08 LAB — MICROSCOPIC EXAMINATION: Renal Epithel, UA: NONE SEEN /hpf

## 2021-02-08 NOTE — Progress Notes (Signed)
Urological Symptom Review  Patient is experiencing the following symptoms: Patient takes fluid pill- unable to answer urinary questions   Review of Systems  Gastrointestinal (upper)  : Negative for upper GI symptoms  Gastrointestinal (lower) : Negative for lower GI symptoms  Constitutional : Negative for symptoms  Skin: Negative for skin symptoms  Eyes: Negative for eye symptoms  Ear/Nose/Throat : Negative for Ear/Nose/Throat symptoms  Hematologic/Lymphatic: Negative for Hematologic/Lymphatic symptoms  Cardiovascular : Negative for cardiovascular symptoms  Respiratory : Negative for respiratory symptoms  Endocrine: Negative for endocrine symptoms  Musculoskeletal: Negative for musculoskeletal symptoms  Neurological: Negative for neurological symptoms  Psychologic: Negative for psychiatric symptoms

## 2021-02-08 NOTE — Progress Notes (Signed)
History of Present Illness: Pt presents for followup of elevated PSA as well as h/o recurrent UTIs.  He has had no urinary tract infections since his visit last year.  Prior to his visit last year, PSA was 6.3 in late 2020.  When checked in April, 2021 it was 4.2.  He denies significant lower urinary tract symptoms.  IPSS 2.  Past Medical History:  Diagnosis Date  . Anemia   . Anticoagulated on Coumadin    long-term  . Arrhythmia   . Bleeding disorder ([place])   . Chronic kidney disease    frequent uti's last uti few yrs ago  . Diastolic CHF, chronic ([place])   . Dysrhythmia   . GERD (gastroesophageal reflux disease)   . Headache   . Heart disease   . Hiatal hernia   . History of adenomatous polyp of colon   . History of esophageal stricture    s/p  dilatations  . History of GI bleed    03/ 2003  per EGD gastritis  . HOH (hard of hearing)    both ears  . Hypertension   . Non-rheumatic aortic stenosis    per last echo 12-14-2015    . OA (osteoarthritis)    oa  . Persistent atrial fibrillation [facility]) first dx  1999   primary cardiologist-  dr taylor-- hx DCCV  . Pneumonia 2009   with cpap usage  . Shortness of breath    when exertion  . Sleep apnea    last sleep study > 5 yrs   no cpap used    Past Surgical History:  Procedure Laterality Date  . ABDOMINAL HERNIA REPAIR  1990s  . ANTERIOR CERVICAL DECOMP/DISCECTOMY FUSION N/A 02/14/2013   Procedure: ANTERIOR CERVICAL DECOMPRESSION/DISCECTOMY FUSION 2 LEVELS;  Surgeon: Charlie Pitter, MD;  Location: [place] [facility];  Service: Neurosurgery;  Laterality: N/A;  . BALLOON DILATION N/A 12/11/2017   Procedure: BALLOON DILATION;  Surgeon: Clarene Essex, MD;  [facility];  Service: Endoscopy;  Laterality: N/A;  . COLONOSCOPY  last one 06-03-2012  . COLONOSCOPY  06/03/2012   Procedure: COLONOSCOPY;  Surgeon: Daneil Dolin, MD;  [facility];  Service: Endoscopy;  Laterality: N/A;  8:30  . esophageal strecching  yrs ago   . ESOPHAGOGASTRODUODENOSCOPY (EGD) WITH PROPOFOL N/A 12/11/2017   Procedure: ESOPHAGOGASTRODUODENOSCOPY (EGD) WITH PROPOFOL;  Surgeon: Clarene Essex, MD;  [facility];  Service: Endoscopy;  Laterality: N/A;  . ESOPHAGUS SURGERY  20 yrs ago   for food sticking  . FOOT SURGERY Right 04-27-2010   dr duda   joint arthrodesis  . right foot surgery  2018   to remove screw  . TOTAL KNEE ARTHROPLASTY Right 12/07/2017   Procedure: RIGHT TOTAL KNEE ARTHROPLASTY;  Surgeon: Mcarthur Rossetti, MD;  [facility];  Service: Orthopedics;  Laterality: Right;  . TRANSTHORACIC ECHOCARDIOGRAM  12-14-2015  dr taylor   moderate LVH, ef 55-60%, indeterminate diastolic function in setting of AFib/  moderate LAE and RAE/  mild to moderate calcific aoric stenosis with mild AR (valve area 1.33cm^2, peak grandient3mmHg, mean grandient 31mmHg)/ trivial MR/ mild TR/ PASP 41mmHg  . VOLVULUS REDUCTION  3710,[id number]    Home Medications:  Allergies as of 02/08/2021   No Known Allergies     Medication List       Accurate as of February 08, 2021  8:21 AM. If you have any questions, ask your nurse or doctor.        atorvastatin 20  MG tablet Commonly known as: LIPITOR Take 1 tablet (20 mg total) by mouth daily.   carvedilol 3.125 MG tablet Commonly known as: COREG TAKE (1) TABLET BY MOUTH TWICE DAILY.   Co-Enzyme Q10 100 MG Caps Take by mouth.   ferrous sulfate 325 (65 FE) MG tablet Take 325 mg by mouth daily with breakfast.   Fish Oil 1000 MG Caps Take by mouth.   fluticasone 50 MCG/ACT nasal spray Commonly known as: FLONASE Place 1 spray into the nose daily.   furosemide 40 MG tablet Commonly known as: LASIX TAKE (1) TABLET BY MOUTH ONCE DAILY.   HYDROcodone-acetaminophen 5-325 MG tablet Commonly known as: NORCO/VICODIN   losartan 100 MG tablet Commonly known as: COZAAR Take 100 mg by mouth daily.   methocarbamol 500 MG tablet Commonly known as: ROBAXIN Take 1 tablet (500 mg  total) by mouth every 6 (six) hours as needed for muscle spasms.   omeprazole 20 MG capsule Commonly known as: PRILOSEC Take 20 mg by mouth at bedtime.   warfarin 5 MG tablet Commonly known as: COUMADIN Take as directed by the anticoagulation clinic. If you are unsure how to take this medication, talk to your nurse or doctor. Original instructions: TAKE 1 TABLET BY MOUTH DAILY, EXCEPT 1/2 TABLET ON TUESDAYS AND FRIDAYS.       Allergies: No Known Allergies  Family History  Problem Relation Age of Onset  . Heart disease Mother        Pacemaker  . Diabetes Father   . Kidney failure Father   . Colon cancer Neg Hx     Social History:  reports that he has never smoked. He has never used smokeless tobacco. He reports that he does not drink alcohol and does not use drugs.  ROS: A complete review of systems was performed.  All systems are negative except for pertinent findings as noted.  Physical Exam:  Vital signs in last 24 hours: There were no vitals taken for this visit. Constitutional:  Alert and oriented, No acute distress Cardiovascular: Regular rate  Respiratory: Normal respiratory effort GI: Abdomen is soft, nontender, nondistended, no abdominal masses. No CVAT.  Genitourinary: Normal uncircumcised male phallus, testes are descended bilaterally and non-tender and without masses, scrotum is normal in appearance without lesions or masses, perineum is normal on inspection.  Prostate 60 g, symmetrical, nonnodular, nontender. Lymphatic: No lymphadenopathy Neurologic: Grossly intact, no focal deficits Psychiatric: Normal mood and affect  I have reviewed prior pt notes  I have reviewed notes from referring/previous physicians  I have reviewed urinalysis results-voided specimen was done without retracting foreskin  I have reviewed prior PSA results    Impression/Assessment:  1.  BPH, symptoms stable on tamsulosin  2.  History of elevated PSA, PSA last year 4.2.  Stable  DRE today  Plan:  1.  His PSA will be checked today  2.  Continue tamsulosin  3.  I will see back in 1 year for routine check

## 2021-02-09 ENCOUNTER — Telehealth: Payer: Self-pay

## 2021-02-09 LAB — PSA: Prostate Specific Ag, Serum: 5 ng/mL — ABNORMAL HIGH (ref 0.0–4.0)

## 2021-02-09 NOTE — Telephone Encounter (Signed)
Returned call to daughter and made her aware that as soon as Dr. address lab results we would call her back. Daughter voiced understanding.

## 2021-02-09 NOTE — Telephone Encounter (Signed)
Patient's Daughter, [name] is requesting a call back to discuss recent lab results.  Call back:  [phone]  Thanks, Helene Kelp

## 2021-02-10 NOTE — Telephone Encounter (Signed)
-----   Message from Franchot Gallo, MD sent at 02/10/2021  2:46 PM EDT ----- Notify patient that PSA was normal for him at 5 ----- Message ----- From: Dorisann Frames, RN Sent: 02/09/2021  10:58 AM EDT To: Franchot Gallo, MD  Please review

## 2021-02-10 NOTE — Telephone Encounter (Signed)
Pt daughter notified of recent PSA level. Voiced understanding.

## 2021-03-10 ENCOUNTER — Ambulatory Visit (INDEPENDENT_AMBULATORY_CARE_PROVIDER_SITE_OTHER): Payer: Medicare Other | Admitting: *Deleted

## 2021-03-10 DIAGNOSIS — Z5181 Encounter for therapeutic drug level monitoring: Secondary | ICD-10-CM | POA: Diagnosis not present

## 2021-03-10 DIAGNOSIS — I480 Paroxysmal atrial fibrillation: Secondary | ICD-10-CM

## 2021-03-10 LAB — POCT INR: INR: 1.9 — AB (ref 2.0–3.0)

## 2021-03-10 NOTE — Patient Instructions (Signed)
Take warfarin 1 1/2 tablets tonight then resume 1 tablet daily except 1/2 tablet on Tuesdays and Fridays  Recheck INR in 6 weeks

## 2021-04-12 DIAGNOSIS — Z1389 Encounter for screening for other disorder: Secondary | ICD-10-CM | POA: Diagnosis not present

## 2021-04-12 DIAGNOSIS — E6609 Other obesity due to excess calories: Secondary | ICD-10-CM | POA: Diagnosis not present

## 2021-04-12 DIAGNOSIS — G894 Chronic pain syndrome: Secondary | ICD-10-CM | POA: Diagnosis not present

## 2021-04-12 DIAGNOSIS — Z6835 Body mass index (BMI) 35.0-35.9, adult: Secondary | ICD-10-CM | POA: Diagnosis not present

## 2021-04-12 DIAGNOSIS — M1991 Primary osteoarthritis, unspecified site: Secondary | ICD-10-CM | POA: Diagnosis not present

## 2021-04-22 ENCOUNTER — Ambulatory Visit (INDEPENDENT_AMBULATORY_CARE_PROVIDER_SITE_OTHER): Payer: Medicare Other | Admitting: *Deleted

## 2021-04-22 ENCOUNTER — Other Ambulatory Visit: Payer: Self-pay

## 2021-04-22 ENCOUNTER — Other Ambulatory Visit: Payer: Self-pay | Admitting: Internal Medicine

## 2021-04-22 DIAGNOSIS — I480 Paroxysmal atrial fibrillation: Secondary | ICD-10-CM | POA: Diagnosis not present

## 2021-04-22 DIAGNOSIS — Z5181 Encounter for therapeutic drug level monitoring: Secondary | ICD-10-CM

## 2021-04-22 DIAGNOSIS — I5032 Chronic diastolic (congestive) heart failure: Secondary | ICD-10-CM

## 2021-04-22 LAB — POCT INR: INR: 2.6 (ref 2.0–3.0)

## 2021-04-22 NOTE — Patient Instructions (Signed)
Continue warfarin 1 tablet daily except 1/2 tablet on Tuesdays and Fridays ?Recheck INR in 6 weeks.  ?

## 2021-05-16 ENCOUNTER — Ambulatory Visit: Payer: Self-pay

## 2021-05-16 ENCOUNTER — Ambulatory Visit: Payer: Medicare Other | Admitting: Orthopedic Surgery

## 2021-05-16 ENCOUNTER — Encounter: Payer: Self-pay | Admitting: Orthopedic Surgery

## 2021-05-16 DIAGNOSIS — M25571 Pain in right ankle and joints of right foot: Secondary | ICD-10-CM | POA: Diagnosis not present

## 2021-05-16 DIAGNOSIS — G8929 Other chronic pain: Secondary | ICD-10-CM | POA: Diagnosis not present

## 2021-05-16 DIAGNOSIS — M25511 Pain in right shoulder: Secondary | ICD-10-CM

## 2021-05-16 MED ORDER — LIDOCAINE HCL 1 % IJ SOLN
5.0000 mL | INTRAMUSCULAR | Status: AC | PRN
  Administered 2021-05-16: 5 mL

## 2021-05-16 MED ORDER — METHYLPREDNISOLONE ACETATE 40 MG/ML IJ SUSP
40.0000 mg | INTRAMUSCULAR | Status: AC | PRN
Start: 2021-05-16 — End: 2021-05-16
  Administered 2021-05-16: 40 mg via INTRA_ARTICULAR

## 2021-05-16 NOTE — Progress Notes (Signed)
Office Visit Note   Patient: Brent Mason           Date of Birth: 1946/05/22           MRN: [id number] Visit Date: 05/16/2021              Requested by: Redmond School, MD [address] [place],   [id number] PCP: Redmond School, MD  Chief Complaint  Patient presents with   Right Shoulder - Pain   Right Foot - Pain      HPI: Patient is a 75 year old gentleman who presents with a 1 month history of pain in the right shoulder.  He has recently developed ecchymosis and bruising proximally in the right arm.  Patient states he has difficulty trying to get his hand in his back pocket.  Patient also has chronic pain over the Charcot rocker-bottom deformity of the right foot status post subtalar and talonavicular fusion.  Assessment & Plan: Visit Diagnoses:  1. Chronic right shoulder pain   2. Pain in right ankle and joints of right foot     Plan: Recommended [place] sneakers with a stiff soled to minimize flexion to unload the bony prominence plantarly.  Patient underwent a subacromial injection in the right shoulder.  Reevaluate in 4 weeks.  Patient may need additional injections in the right shoulder and may need arthroscopic debridement.  Follow-Up Instructions: Return in about 4 weeks (around 06/13/2021).   Ortho Exam  Patient is alert, oriented, no adenopathy, well-dressed, normal affect, normal respiratory effort. Examination patient has abduction and flexion passively to 90 degrees actively to about 45 degrees.  There is ecchymosis and bruising proximally along the biceps tendon the biceps tendon is tender to palpation.  Patient has pain with Neer and Hawkins impingement test.  Semination the right foot patient has pain directly over the bony prominence at the rocker-bottom deformity this is essentially unchanged but patient does have thin atrophic skin and is wearing very thin shoes that are not supportive.  Imaging: XR Foot Complete Right  Result Date:  05/16/2021 2 view radiographs of the right foot shows a stable talonavicular and subtalar fusion no hardware failure no progression of the pes planus.  XR Shoulder Right  Result Date: 05/16/2021 2 view radiographs of the right shoulder shows a congruent glenohumeral joint with no significant migration of the humeral head within the glenoid.  No images are attached to the encounter.  Labs: Lab Results  Component Value Date   REPTSTATUS 11/20/2019 FINAL 11/18/2019   CULT >=100,000 COLONIES/mL KLEBSIELLA PNEUMONIAE (A) 11/18/2019   LABORGA KLEBSIELLA PNEUMONIAE (A) 11/18/2019     Lab Results  Component Value Date   ALBUMIN 4.0 12/02/2012   ALBUMIN 4.0 04/22/2010    No results found for: MG No results found for: VD25OH  No results found for: PREALBUMIN CBC EXTENDED Latest Ref Rng & Units 12/11/2017 12/08/2017 11/28/2017  WBC 4.0 - 10.5 K/uL 5.9 16.5(H) 8.1  RBC 4.22 - 5.81 MIL/uL 3.56(L) 3.97(L) 4.66  HGB 13.0 - 17.0 g/dL 11.4(L) 12.6(L) 15.4  HCT 39.0 - 52.0 % 34.1(L) 38.3(L) 44.6  PLT 150 - 400 K/uL 185 186 194  NEUTROABS 1.7 - 7.7 K/uL - - -  LYMPHSABS 0.7 - 4.0 K/uL - - -     There is no height or weight on file to calculate BMI.  Orders:  Orders Placed This Encounter  Procedures   XR Shoulder Right   XR Foot Complete Right   No orders of the  defined types were placed in this encounter.    Procedures: Large Joint Inj: R subacromial bursa on 05/16/2021 3:36 PM Indications: diagnostic evaluation and pain Details: 22 G 1.5 in needle, posterior approach  Arthrogram: No  Medications: 5 mL lidocaine 1 %; 40 mg methylPREDNISolone acetate 40 MG/ML Outcome: tolerated well, no immediate complications Procedure, treatment alternatives, risks and benefits explained, specific risks discussed. Consent was given by the patient. Immediately prior to procedure a time out was called to verify the correct patient, procedure, equipment, support staff and site/side marked as required.  Patient was prepped and draped in the usual sterile fashion.     Clinical Data: No additional findings.  ROS:  All other systems negative, except as noted in the HPI. Review of Systems  Objective: Vital Signs: There were no vitals taken for this visit.  Specialty Comments:  No specialty comments available.  PMFS History: Patient Active Problem List   Diagnosis Date Noted   Status post total right knee replacement 12/07/2017   Unilateral primary osteoarthritis, right knee 11/05/2017   Primary osteoarthritis of both knees 05/28/2017   S/P foot surgery, right 12/05/2016   Pain in right foot 12/05/2016   Pain from implanted hardware 12/05/2016   Bilateral carotid bruits 07/08/2014   Encounter for therapeutic drug monitoring 11/13/2013   Claudication of left lower extremity ([place]) 12/02/2012   Neck pain 09/16/2012   Cervical spondylosis without myelopathy 09/10/2012   Hx of adenomatous colonic polyps 05/07/2012   Rectal bleeding 05/07/2012   GERD (gastroesophageal reflux disease) 05/07/2012   Long term current use of anticoagulant 01/25/2011   HYPERLIPIDEMIA 11/21/2010   DYSPNEA 10/20/2010   MORBID OBESITY 09/01/2009   Atrial fibrillation ([place]) 09/01/2009   Essential hypertension 06/14/2009   ESOPHAGEAL STRICTURE 06/14/2009   DIZZINESS 06/14/2009   Past Medical History:  Diagnosis Date   Anemia    Anticoagulated on Coumadin    long-term   Arrhythmia    Bleeding disorder ([place])    Chronic kidney disease    frequent uti's last uti few yrs ago   Diastolic CHF, chronic ([facility])    Dysrhythmia    GERD (gastroesophageal reflux disease)    Headache    Heart disease    Hiatal hernia    History of adenomatous polyp of colon    History of esophageal stricture    s/p  dilatations   History of GI bleed    03/ 2003  per EGD gastritis   HOH (hard of hearing)    both ears   Hypertension    Non-rheumatic aortic stenosis    per last echo 12-14-2015     OA (osteoarthritis)     oa   Persistent atrial fibrillation ([facility]) first dx  1999   primary cardiologist-  dr Lovena Le-- hx DCCV   Pneumonia 2009   with cpap usage   Shortness of breath    when exertion   Sleep apnea    last sleep study > 5 yrs   no cpap used    Family History  Problem Relation Age of Onset   Heart disease Mother        Pacemaker   Diabetes Father    Kidney failure Father    Colon cancer Neg Hx     Past Surgical History:  Procedure Laterality Date   ABDOMINAL HERNIA REPAIR  1990s   ANTERIOR CERVICAL DECOMP/DISCECTOMY FUSION N/A 02/14/2013   Procedure: ANTERIOR CERVICAL DECOMPRESSION/DISCECTOMY FUSION 2 LEVELS;  Surgeon: Charlie Pitter, MD;  Location: [facility]  ORS;  Service: Neurosurgery;  Laterality: N/A;   BALLOON DILATION N/A 12/11/2017   Procedure: BALLOON DILATION;  Surgeon: Clarene Essex, MD;  [facility];  Service: Endoscopy;  Laterality: N/A;   COLONOSCOPY  last one 06-03-2012   COLONOSCOPY  06/03/2012   Procedure: COLONOSCOPY;  Surgeon: Daneil Dolin, MD;  [facility];  Service: Endoscopy;  Laterality: N/A;  8:30   esophageal strecching  yrs ago   ESOPHAGOGASTRODUODENOSCOPY (EGD) WITH PROPOFOL N/A 12/11/2017   Procedure: ESOPHAGOGASTRODUODENOSCOPY (EGD) WITH PROPOFOL;  Surgeon: Clarene Essex, MD;  [facility];  Service: Endoscopy;  Laterality: N/A;   ESOPHAGUS SURGERY  20 yrs ago   for food sticking   FOOT SURGERY Right 04-27-2010   dr Aiyannah Fayad   joint arthrodesis   right foot surgery  2018   to remove screw   TOTAL KNEE ARTHROPLASTY Right 12/07/2017   Procedure: RIGHT TOTAL KNEE ARTHROPLASTY;  Surgeon: Mcarthur Rossetti, MD;  [facility];  Service: Orthopedics;  Laterality: Right;   TRANSTHORACIC ECHOCARDIOGRAM  12-14-2015  dr taylor   moderate LVH, ef 55-60%, indeterminate diastolic function in setting of AFib/  moderate LAE and RAE/  mild to moderate calcific aoric stenosis with mild AR (valve area 1.33cm^2, peak grandient52mHg, mean  grandient 191mg)/ trivial MR/ mild TR/ PASP 3387m   VOLVULUS REDUCTION  [id number] History   Occupational History   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

## 2021-06-06 ENCOUNTER — Ambulatory Visit (INDEPENDENT_AMBULATORY_CARE_PROVIDER_SITE_OTHER): Payer: Medicare Other | Admitting: *Deleted

## 2021-06-06 DIAGNOSIS — Z5181 Encounter for therapeutic drug level monitoring: Secondary | ICD-10-CM

## 2021-06-06 DIAGNOSIS — I480 Paroxysmal atrial fibrillation: Secondary | ICD-10-CM

## 2021-06-06 LAB — POCT INR: INR: 2.5 (ref 2.0–3.0)

## 2021-06-06 NOTE — Patient Instructions (Signed)
Continue warfarin 1 tablet daily except 1/2 tablet on Tuesdays and Fridays ?Recheck INR in 6 weeks.  ?

## 2021-06-13 ENCOUNTER — Ambulatory Visit: Payer: Medicare Other | Admitting: Orthopedic Surgery

## 2021-06-14 ENCOUNTER — Ambulatory Visit (INDEPENDENT_AMBULATORY_CARE_PROVIDER_SITE_OTHER): Payer: Medicare Other | Admitting: Orthopedic Surgery

## 2021-06-14 ENCOUNTER — Other Ambulatory Visit: Payer: Self-pay

## 2021-06-14 ENCOUNTER — Encounter: Payer: Self-pay | Admitting: Orthopedic Surgery

## 2021-06-14 DIAGNOSIS — G8929 Other chronic pain: Secondary | ICD-10-CM

## 2021-06-14 DIAGNOSIS — M25571 Pain in right ankle and joints of right foot: Secondary | ICD-10-CM | POA: Diagnosis not present

## 2021-06-14 DIAGNOSIS — M25511 Pain in right shoulder: Secondary | ICD-10-CM | POA: Diagnosis not present

## 2021-06-14 NOTE — Progress Notes (Signed)
Office Visit Note   Patient: Brent Mason           Date of Birth: 1946-01-19           MRN: [id number] Visit Date: 06/14/2021              Requested by: Redmond School, MD [address] [place],  [place] [address] PCP: Redmond School, MD  Chief Complaint  Patient presents with   Right Shoulder - Follow-up   Right Foot - Follow-up      HPI: Patient is a 75 year old gentleman who presents in follow-up for right shoulder pain and right foot pain.  He is status post a subacromial injection in the right shoulder which she states completely relieved his symptoms.  Assessment & Plan: Visit Diagnoses:  1. Chronic right shoulder pain   2. Pain in right ankle and joints of right foot     Plan: Patient will continue with his new protective shoe wear and increase activities as tolerated follow-up as needed.  Follow-Up Instructions: Return if symptoms worsen or fail to improve.   Ortho Exam  Patient is alert, oriented, no adenopathy, well-dressed, normal affect, normal respiratory effort. Examination patient has a triphasic dopplerable dorsalis pedis and posterior tibial pulse on the right no arterial insufficiency he does have venous stasis changes with brawny skin color changes but no ulcers his foot is plantigrade there are no plantar ulcers or dorsal ulcers on the foot.  Examination the right shoulder he has full pain-free range of motion without pain with Neer and Hawkins impingement test.  Imaging: No results found. No images are attached to the encounter.  Labs: Lab Results  Component Value Date   REPTSTATUS 11/20/2019 FINAL 11/18/2019   CULT >=100,000 COLONIES/mL KLEBSIELLA PNEUMONIAE (A) 11/18/2019   LABORGA KLEBSIELLA PNEUMONIAE (A) 11/18/2019     Lab Results  Component Value Date   ALBUMIN 4.0 12/02/2012   ALBUMIN 4.0 04/22/2010    No results found for: MG No results found for: VD25OH  No results found for: PREALBUMIN CBC EXTENDED Latest Ref Rng &  Units 12/11/2017 12/08/2017 11/28/2017  WBC 4.0 - 10.5 K/uL 5.9 16.5(H) 8.1  RBC 4.22 - 5.81 MIL/uL 3.56(L) 3.97(L) 4.66  HGB 13.0 - 17.0 g/dL 11.4(L) 12.6(L) 15.4  HCT 39.0 - 52.0 % 34.1(L) 38.3(L) 44.6  PLT 150 - 400 K/uL 185 186 194  NEUTROABS 1.7 - 7.7 K/uL - - -  LYMPHSABS 0.7 - 4.0 K/uL - - -     There is no height or weight on file to calculate BMI.  Orders:  No orders of the defined types were placed in this encounter.  No orders of the defined types were placed in this encounter.    Procedures: No procedures performed  Clinical Data: No additional findings.  ROS:  All other systems negative, except as noted in the HPI. Review of Systems  Objective: Vital Signs: There were no vitals taken for this visit.  Specialty Comments:  No specialty comments available.  PMFS History: Patient Active Problem List   Diagnosis Date Noted   Status post total right knee replacement 12/07/2017   Unilateral primary osteoarthritis, right knee 11/05/2017   Primary osteoarthritis of both knees 05/28/2017   S/P foot surgery, right 12/05/2016   Pain in right foot 12/05/2016   Pain from implanted hardware 12/05/2016   Bilateral carotid bruits 07/08/2014   Encounter for therapeutic drug monitoring 11/13/2013   Claudication of left lower extremity ([place]) 12/02/2012   Neck pain  09/16/2012   Cervical spondylosis without myelopathy 09/10/2012   Hx of adenomatous colonic polyps 05/07/2012   Rectal bleeding 05/07/2012   GERD (gastroesophageal reflux disease) 05/07/2012   Long term current use of anticoagulant 01/25/2011   HYPERLIPIDEMIA 11/21/2010   DYSPNEA 10/20/2010   MORBID OBESITY 09/01/2009   Atrial fibrillation ([place]) 09/01/2009   Essential hypertension 06/14/2009   ESOPHAGEAL STRICTURE 06/14/2009   DIZZINESS 06/14/2009   Past Medical History:  Diagnosis Date   Anemia    Anticoagulated on Coumadin    long-term   Arrhythmia    Bleeding disorder ([place])    Chronic kidney  disease    frequent uti's last uti few yrs ago   Diastolic CHF, chronic ([facility])    Dysrhythmia    GERD (gastroesophageal reflux disease)    Headache    Heart disease    Hiatal hernia    History of adenomatous polyp of colon    History of esophageal stricture    s/p  dilatations   History of GI bleed    03/ 2003  per EGD gastritis   HOH (hard of hearing)    both ears   Hypertension    Non-rheumatic aortic stenosis    per last echo 12-14-2015     OA (osteoarthritis)    oa   Persistent atrial fibrillation ([place]) first dx  1999   primary cardiologist-  dr Lovena Le-- hx DCCV   Pneumonia 2009   with cpap usage   Shortness of breath    when exertion   Sleep apnea    last sleep study > 5 yrs   no cpap used    Family History  Problem Relation Age of Onset   Heart disease Mother        Pacemaker   Diabetes Father    Kidney failure Father    Colon cancer Neg Hx     Past Surgical History:  Procedure Laterality Date   ABDOMINAL HERNIA REPAIR  1990s   ANTERIOR CERVICAL DECOMP/DISCECTOMY FUSION N/A 02/14/2013   Procedure: ANTERIOR CERVICAL DECOMPRESSION/DISCECTOMY FUSION 2 LEVELS;  Surgeon: Charlie Pitter, MD;  Location: [facility];  Service: Neurosurgery;  Laterality: N/A;   BALLOON DILATION N/A 12/11/2017   Procedure: BALLOON DILATION;  Surgeon: Clarene Essex, MD;  [facility];  Service: Endoscopy;  Laterality: N/A;   COLONOSCOPY  last one 06-03-2012   COLONOSCOPY  06/03/2012   Procedure: COLONOSCOPY;  Surgeon: Daneil Dolin, MD;  [facility];  Service: Endoscopy;  Laterality: N/A;  8:30   esophageal strecching  yrs ago   ESOPHAGOGASTRODUODENOSCOPY (EGD) WITH PROPOFOL N/A 12/11/2017   Procedure: ESOPHAGOGASTRODUODENOSCOPY (EGD) WITH PROPOFOL;  Surgeon: Clarene Essex, MD;  [facility];  Service: Endoscopy;  Laterality: N/A;   ESOPHAGUS SURGERY  20 yrs ago   for food sticking   FOOT SURGERY Right 04-27-2010   dr Jaquae Rieves   joint arthrodesis   right foot surgery   2018   to remove screw   TOTAL KNEE ARTHROPLASTY Right 12/07/2017   Procedure: RIGHT TOTAL KNEE ARTHROPLASTY;  Surgeon: Mcarthur Rossetti, MD;  [facility];  Service: Orthopedics;  Laterality: Right;   TRANSTHORACIC ECHOCARDIOGRAM  12-14-2015  dr taylor   moderate LVH, ef 55-60%, indeterminate diastolic function in setting of AFib/  moderate LAE and RAE/  mild to moderate calcific aoric stenosis with mild AR (valve area 1.33cm^2, peak grandient82mHg, mean grandient 161mg)/ trivial MR/ mild TR/ PASP 3383m   VOLVULUS REDUCTION  [id number] History  Occupational History   Occupation: LABORER    Fish farm manager: [employer]    Comment: full time  Tobacco Use   Smoking status: Never   Smokeless tobacco: Never  Vaping Use   Vaping Use: Never used  Substance and Sexual Activity   Alcohol use: No    Alcohol/week: 0.0 standard drinks   Drug use: No   Sexual activity: Not on file

## 2021-07-18 ENCOUNTER — Ambulatory Visit (INDEPENDENT_AMBULATORY_CARE_PROVIDER_SITE_OTHER): Payer: Medicare Other | Admitting: *Deleted

## 2021-07-18 ENCOUNTER — Other Ambulatory Visit: Payer: Self-pay

## 2021-07-18 DIAGNOSIS — I480 Paroxysmal atrial fibrillation: Secondary | ICD-10-CM

## 2021-07-18 DIAGNOSIS — Z5181 Encounter for therapeutic drug level monitoring: Secondary | ICD-10-CM | POA: Diagnosis not present

## 2021-07-18 LAB — POCT INR: INR: 2.5 (ref 2.0–3.0)

## 2021-07-18 NOTE — Patient Instructions (Signed)
Continue warfarin 1 tablet daily except 1/2 tablet on Tuesdays and Fridays ?Recheck INR in 6 weeks.  ?

## 2021-07-19 ENCOUNTER — Other Ambulatory Visit: Payer: Self-pay | Admitting: Internal Medicine

## 2021-08-01 DIAGNOSIS — N400 Enlarged prostate without lower urinary tract symptoms: Secondary | ICD-10-CM | POA: Diagnosis not present

## 2021-08-01 DIAGNOSIS — M159 Polyosteoarthritis, unspecified: Secondary | ICD-10-CM | POA: Diagnosis not present

## 2021-08-01 DIAGNOSIS — G894 Chronic pain syndrome: Secondary | ICD-10-CM | POA: Diagnosis not present

## 2021-08-29 ENCOUNTER — Ambulatory Visit (INDEPENDENT_AMBULATORY_CARE_PROVIDER_SITE_OTHER): Payer: Medicare Other | Admitting: *Deleted

## 2021-08-29 ENCOUNTER — Other Ambulatory Visit: Payer: Self-pay

## 2021-08-29 DIAGNOSIS — Z5181 Encounter for therapeutic drug level monitoring: Secondary | ICD-10-CM

## 2021-08-29 DIAGNOSIS — I480 Paroxysmal atrial fibrillation: Secondary | ICD-10-CM | POA: Diagnosis not present

## 2021-08-29 LAB — POCT INR: INR: 2.3 (ref 2.0–3.0)

## 2021-08-29 NOTE — Patient Instructions (Signed)
Continue warfarin 1 tablet daily except 1/2 tablet on Tuesdays and Fridays ?Recheck INR in 6 weeks.  ?

## 2021-10-14 ENCOUNTER — Other Ambulatory Visit: Payer: Self-pay | Admitting: Internal Medicine

## 2021-10-24 ENCOUNTER — Ambulatory Visit (INDEPENDENT_AMBULATORY_CARE_PROVIDER_SITE_OTHER): Payer: Medicare Other | Admitting: *Deleted

## 2021-10-24 DIAGNOSIS — I480 Paroxysmal atrial fibrillation: Secondary | ICD-10-CM

## 2021-10-24 DIAGNOSIS — Z5181 Encounter for therapeutic drug level monitoring: Secondary | ICD-10-CM | POA: Diagnosis not present

## 2021-10-24 LAB — POCT INR: INR: 1.3 — AB (ref 2.0–3.0)

## 2021-10-24 NOTE — Patient Instructions (Signed)
Take warfarin 1 1/2 tablets tonight and tomorrow night then resume 1 tablet daily except 1/2 tablet on Tuesdays and Fridays Advised to recheck in 2 wks.  Pt refused to come back before 6 wks.

## 2021-11-05 ENCOUNTER — Other Ambulatory Visit: Payer: Self-pay | Admitting: Internal Medicine

## 2021-11-22 ENCOUNTER — Other Ambulatory Visit: Payer: Self-pay | Admitting: Internal Medicine

## 2021-12-01 DIAGNOSIS — Z0001 Encounter for general adult medical examination with abnormal findings: Secondary | ICD-10-CM | POA: Diagnosis not present

## 2021-12-01 DIAGNOSIS — I10 Essential (primary) hypertension: Secondary | ICD-10-CM | POA: Diagnosis not present

## 2021-12-01 DIAGNOSIS — N400 Enlarged prostate without lower urinary tract symptoms: Secondary | ICD-10-CM | POA: Diagnosis not present

## 2021-12-01 DIAGNOSIS — G8929 Other chronic pain: Secondary | ICD-10-CM | POA: Diagnosis not present

## 2021-12-06 ENCOUNTER — Other Ambulatory Visit: Payer: Self-pay

## 2021-12-06 ENCOUNTER — Ambulatory Visit (INDEPENDENT_AMBULATORY_CARE_PROVIDER_SITE_OTHER): Payer: Medicare Other | Admitting: *Deleted

## 2021-12-06 DIAGNOSIS — Z5181 Encounter for therapeutic drug level monitoring: Secondary | ICD-10-CM

## 2021-12-06 DIAGNOSIS — I480 Paroxysmal atrial fibrillation: Secondary | ICD-10-CM

## 2021-12-06 LAB — POCT INR: INR: 1.6 — AB (ref 2.0–3.0)

## 2021-12-06 NOTE — Patient Instructions (Signed)
Increase warfarin to 1 tablet daily Pt refused to come back before 4 wks.

## 2021-12-30 ENCOUNTER — Other Ambulatory Visit: Payer: Self-pay | Admitting: Internal Medicine

## 2021-12-30 DIAGNOSIS — I5032 Chronic diastolic (congestive) heart failure: Secondary | ICD-10-CM

## 2022-01-03 ENCOUNTER — Ambulatory Visit (INDEPENDENT_AMBULATORY_CARE_PROVIDER_SITE_OTHER): Payer: Medicare Other | Admitting: *Deleted

## 2022-01-03 DIAGNOSIS — Z5181 Encounter for therapeutic drug level monitoring: Secondary | ICD-10-CM

## 2022-01-03 DIAGNOSIS — I480 Paroxysmal atrial fibrillation: Secondary | ICD-10-CM | POA: Diagnosis not present

## 2022-01-03 LAB — POCT INR: INR: 2.6 (ref 2.0–3.0)

## 2022-01-03 NOTE — Patient Instructions (Signed)
Continue warfarin 1 tablet daily °Recheck in 6 wks °

## 2022-01-17 ENCOUNTER — Encounter: Payer: Self-pay | Admitting: Internal Medicine

## 2022-01-17 ENCOUNTER — Ambulatory Visit (INDEPENDENT_AMBULATORY_CARE_PROVIDER_SITE_OTHER): Payer: Medicare Other | Admitting: Internal Medicine

## 2022-01-17 VITALS — BP 138/80 | HR 54 | Ht 74.0 in | Wt 286.6 lb

## 2022-01-17 DIAGNOSIS — I4819 Other persistent atrial fibrillation: Secondary | ICD-10-CM

## 2022-01-17 NOTE — Progress Notes (Signed)
? ? ? ? ?HPI ?[name] returns today for follow-up of his atrial fibrillation, hypertension, dyslipidemia, and obesity. In the interim, he notes that he has gained 33 lbs lbs since his last visit. He denies chest pain. His daughter notes that he will at times have some wheezing sounds especially at night. He admits to dietary indiscretion. He is not exercising. ?No Known Allergies ? ? ?Current Outpatient Medications  ?Medication Sig Dispense Refill  ? atorvastatin (LIPITOR) 20 MG tablet TAKE 1 TABLET BY MOUTH ONCE A DAY. 90 tablet 3  ? carvedilol (COREG) 3.125 MG tablet TAKE (1) TABLET BY MOUTH TWICE DAILY. 180 tablet 3  ? Co-Enzyme Q10 100 MG CAPS Take by mouth.    ? ferrous sulfate 325 (65 FE) MG tablet Take 325 mg by mouth daily with breakfast.    ? fluticasone (FLONASE) 50 MCG/ACT nasal spray Place 1 spray into the nose daily.     ? furosemide (LASIX) 40 MG tablet TAKE (1) TABLET BY MOUTH ONCE DAILY. 90 tablet 1  ? HYDROcodone-acetaminophen (NORCO/VICODIN) 5-325 MG tablet     ? losartan (COZAAR) 100 MG tablet Take 100 mg by mouth daily.    ? methocarbamol (ROBAXIN) 500 MG tablet Take 1 tablet (500 mg total) by mouth every 6 (six) hours as needed for muscle spasms. 60 tablet 0  ? Omega-3 Fatty Acids (FISH OIL) 1000 MG CAPS Take by mouth.    ? omeprazole (PRILOSEC) 20 MG capsule Take 20 mg by mouth at bedtime.    ? warfarin (COUMADIN) 5 MG tablet TAKE 1 TABLET BY MOUTH DAILY, EXCEPT 1/2 TABLET ON TUESDAYS AND FRIDAYS. 26 tablet 6  ? ?No current facility-administered medications for this visit.  ? ? ? ?Past Medical History:  ?Diagnosis Date  ? Anemia   ? Anticoagulated on Coumadin   ? long-term  ? Arrhythmia   ? Bleeding disorder ([place])   ? Chronic kidney disease   ? frequent uti's last uti few yrs ago  ? Diastolic CHF, chronic (HCC)   ? Dysrhythmia   ? GERD (gastroesophageal reflux disease)   ? Headache   ? Heart disease   ? Hiatal hernia   ? History of adenomatous polyp of colon   ? History of esophageal  stricture   ? s/p  dilatations  ? History of GI bleed   ? 03/ 2003  per EGD gastritis  ? HOH (hard of hearing)   ? both ears  ? Hypertension   ? Non-rheumatic aortic stenosis   ? per last echo 12-14-2015    ? OA (osteoarthritis)   ? oa  ? Persistent atrial fibrillation ([place]) first dx  1999  ? primary cardiologist-  Brent Mason-- hx DCCV  ? Pneumonia 2009  ? with cpap usage  ? Shortness of breath   ? when exertion  ? Sleep apnea   ? last sleep study > 5 yrs   no cpap used  ? ? ?ROS: ? ? All systems reviewed and negative except as noted in the HPI. ? ? ?Past Surgical History:  ?Procedure Laterality Date  ? ABDOMINAL HERNIA REPAIR  1990s  ? ANTERIOR CERVICAL DECOMP/DISCECTOMY FUSION N/A 02/14/2013  ? Procedure: ANTERIOR CERVICAL DECOMPRESSION/DISCECTOMY FUSION 2 LEVELS;  Surgeon: Brent Pitter, MD;  Location: [facility];  Service: Neurosurgery;  Laterality: N/A;  ? BALLOON DILATION N/A 12/11/2017  ? Procedure: BALLOON DILATION;  Surgeon: Brent Essex, MD;  Location: [facility];  Service: Endoscopy;  Laterality: N/A;  ? COLONOSCOPY  last  one 06-03-2012  ? COLONOSCOPY  06/03/2012  ? Procedure: COLONOSCOPY;  Surgeon: Brent Dolin, MD;  [facility];  Service: Endoscopy;  Laterality: N/A;  8:30  ? esophageal strecching  yrs ago  ? ESOPHAGOGASTRODUODENOSCOPY (EGD) WITH PROPOFOL N/A 12/11/2017  ? Procedure: ESOPHAGOGASTRODUODENOSCOPY (EGD) WITH PROPOFOL;  Surgeon: Brent Essex, MD;  Location: [facility];  Service: Endoscopy;  Laterality: N/A;  ? ESOPHAGUS SURGERY  20 yrs ago  ? for food sticking  ? FOOT SURGERY Right 04-27-2010   Brent Brent Mason  ? joint arthrodesis  ? right foot surgery  2018  ? to remove screw  ? TOTAL KNEE ARTHROPLASTY Right 12/07/2017  ? Procedure: RIGHT TOTAL KNEE ARTHROPLASTY;  Surgeon: Brent Rossetti, MD;  Location: [facility];  Service: Orthopedics;  Laterality: Right;  ? TRANSTHORACIC ECHOCARDIOGRAM  12-14-2015  Brent Mason  ? moderate LVH, ef 55-60%, indeterminate diastolic function in setting  of AFib/  moderate LAE and RAE/  mild to moderate calcific aoric stenosis with mild AR (valve area 1.33cm^2, peak grandient57mHg, mean grandient 153mg)/ trivial MR/ mild TR/ PASP 3372m  ? VOLVULUS REDUCTION  [id number] ? ? ?Family History  ?Problem Relation Age of Onset  ? Heart disease Mother   ?     Pacemaker  ? Diabetes Father   ? Kidney failure Father   ? Colon cancer Neg Hx   ? ? ? ?Social History  ? ?Socioeconomic History  ? Marital status: Widowed  ?  Spouse name: Not on file  ? Number of children: Not on file  ? Years of education: Not on file  ? Highest education level: Not on file  ?Occupational History  ? Occupation: LABORER  ?  Employer: [employer]  ?  Comment: full time  ?Tobacco Use  ? Smoking status: Never  ? Smokeless tobacco: Never  ?Vaping Use  ? Vaping Use: Never used  ?Substance and Sexual Activity  ? Alcohol use: No  ?  Alcohol/week: 0.0 standard drinks  ? Drug use: No  ? Sexual activity: Not on file  ?Other Topics Concern  ? Not on file  ?Social History Narrative  ? No regular exercise  ? ?Social Determinants of Health  ? ?Financial Resource Strain: Not on file  ?Food Insecurity: Not on file  ?Transportation Needs: Not on file  ?Physical Activity: Not on file  ?Stress: Not on file  ?Social Connections: Not on file  ?Intimate Partner Violence: Not on file  ? ? ? ?BP 138/80   Pulse (!) 54   Ht '6\' 2"'$  (1.88 m)   Wt 286 lb 9.6 oz (130 kg)   SpO2 96%   BMI 36.80 kg/m?  ? ?Physical Exam: ? ?Well appearing NAD ?HEENT: Unremarkable ?Neck:  No JVD, no thyromegally ?Lymphatics:  No adenopathy ?Back:  No CVA tenderness ?Lungs:  Clear ?HEART:  Regular rate rhythm, no murmurs, no rubs, no clicks ?Abd:  soft, positive bowel sounds, no organomegally, no rebound, no guarding ?Ext:  2 plus pulses, no edema, no cyanosis, no clubbing ?Skin:  No rashes no nodules ?Neuro:  CN II through XII intact, motor grossly intact ? ?EKG - atrial fib with a controlled VR ? ? ?Assess/Plan:  ?1. Chronic atrial fib - his  VR is well controlled.  ?2. Coags - he has had no bleeding on warfarin ?3. Obesity - he has gained  33 lbs and I have strongly encouraged him to eat less and start back exercising every day. ?4. Diastolic heart failure -  we discussed the importance of a low sodium diet and lasix. ?  ?Carleene Overlie Greg Eckrich,MD ?

## 2022-01-17 NOTE — Patient Instructions (Signed)
Medication Instructions:  Your physician recommends that you continue on your current medications as directed. Please refer to the Current Medication list given to you today.  *If you need a refill on your cardiac medications before your next appointment, please call your pharmacy*   Lab Work: NONE   If you have labs (blood work) drawn today and your tests are completely normal, you will receive your results only by: . MyChart Message (if you have MyChart) OR . A paper copy in the mail If you have any lab test that is abnormal or we need to change your treatment, we will call you to review the results.   Testing/Procedures: NONE    Follow-Up: At [facility], you and your health needs are our priority.  As part of our continuing mission to provide you with exceptional heart care, we have created designated Provider Care Teams.  These Care Teams include your primary Cardiologist (physician) and Advanced Practice Providers (APPs -  Physician Assistants and Nurse Practitioners) who all work together to provide you with the care you need, when you need it.  We recommend signing up for the patient portal called "MyChart".  Sign up information is provided on this After Visit Summary.  MyChart is used to connect with patients for Virtual Visits (Telemedicine).  Patients are able to view lab/test results, encounter notes, upcoming appointments, etc.  Non-urgent messages can be sent to your provider as well.   To learn more about what you can do with MyChart, go to [email or web address].    Your next appointment:   1 year(s)  The format for your next appointment:   In Person  Provider:   Gregg Taylor, MD   Other Instructions Thank you for choosing [facility]!    

## 2022-01-21 ENCOUNTER — Other Ambulatory Visit: Payer: Self-pay | Admitting: Internal Medicine

## 2022-01-21 DIAGNOSIS — I480 Paroxysmal atrial fibrillation: Secondary | ICD-10-CM

## 2022-01-23 NOTE — Telephone Encounter (Signed)
Prescription refill request received for warfarin ?Lov: 01/17/22 Brent Mason) ?Next INR check: 02/14/22 ?Warfarin tablet strength: '5mg'$  ? ?Appropriate dose and refill sent to requested pharmacy.  ?

## 2022-02-07 ENCOUNTER — Encounter: Payer: Self-pay | Admitting: Urology

## 2022-02-07 ENCOUNTER — Ambulatory Visit (INDEPENDENT_AMBULATORY_CARE_PROVIDER_SITE_OTHER): Payer: Medicare Other | Admitting: Urology

## 2022-02-07 ENCOUNTER — Ambulatory Visit: Payer: Medicare Other | Admitting: Urology

## 2022-02-07 VITALS — BP 147/70 | HR 65

## 2022-02-07 DIAGNOSIS — R972 Elevated prostate specific antigen [PSA]: Secondary | ICD-10-CM

## 2022-02-07 DIAGNOSIS — N3000 Acute cystitis without hematuria: Secondary | ICD-10-CM

## 2022-02-07 DIAGNOSIS — N401 Enlarged prostate with lower urinary tract symptoms: Secondary | ICD-10-CM | POA: Diagnosis not present

## 2022-02-07 DIAGNOSIS — R351 Nocturia: Secondary | ICD-10-CM

## 2022-02-07 NOTE — Progress Notes (Signed)
History of Present Illness:  ?Pt presents for followup of elevated PSA as well as h/o recurrent UTIs. ?  ?PSA was 6.3 in late 2020.   ?2.2.2021--4.2 ?4.26.2022--5.0 ? ?4.25.2023: The patient had a PSA drawn on 17 February that was 7.2.  He was having no real urinary issues at that time. ? ?Currently he denies any gross hematuria or dysuria.  IPSS 8, quality-of-life score 2. ?Past Medical History:  ?Diagnosis Date  ? Anemia   ? Anticoagulated on Coumadin   ? long-term  ? Arrhythmia   ? Bleeding disorder ([place])   ? Chronic kidney disease   ? frequent uti's last uti few yrs ago  ? Diastolic CHF, chronic (HCC)   ? Dysrhythmia   ? GERD (gastroesophageal reflux disease)   ? Headache   ? Heart disease   ? Hiatal hernia   ? History of adenomatous polyp of colon   ? History of esophageal stricture   ? s/p  dilatations  ? History of GI bleed   ? 03/ 2003  per EGD gastritis  ? HOH (hard of hearing)   ? both ears  ? Hypertension   ? Non-rheumatic aortic stenosis   ? per last echo 12-14-2015    ? OA (osteoarthritis)   ? oa  ? Persistent atrial fibrillation ([place]) first dx  1999  ? primary cardiologist-  dr taylor-- hx DCCV  ? Pneumonia 2009  ? with cpap usage  ? Shortness of breath   ? when exertion  ? Sleep apnea   ? last sleep study > 5 yrs   no cpap used  ? ? ?Past Surgical History:  ?Procedure Laterality Date  ? ABDOMINAL HERNIA REPAIR  1990s  ? ANTERIOR CERVICAL DECOMP/DISCECTOMY FUSION N/A 02/14/2013  ? Procedure: ANTERIOR CERVICAL DECOMPRESSION/DISCECTOMY FUSION 2 LEVELS;  Surgeon: Charlie Pitter, MD;  Location: [facility];  Service: Neurosurgery;  Laterality: N/A;  ? BALLOON DILATION N/A 12/11/2017  ? Procedure: BALLOON DILATION;  Surgeon: Clarene Essex, MD;  Location: [facility];  Service: Endoscopy;  Laterality: N/A;  ? COLONOSCOPY  last one 06-03-2012  ? COLONOSCOPY  06/03/2012  ? Procedure: COLONOSCOPY;  Surgeon: Daneil Dolin, MD;  [facility];  Service: Endoscopy;  Laterality: N/A;  8:30  ? esophageal  strecching  yrs ago  ? ESOPHAGOGASTRODUODENOSCOPY (EGD) WITH PROPOFOL N/A 12/11/2017  ? Procedure: ESOPHAGOGASTRODUODENOSCOPY (EGD) WITH PROPOFOL;  Surgeon: Clarene Essex, MD;  [facility];  Service: Endoscopy;  Laterality: N/A;  ? ESOPHAGUS SURGERY  20 yrs ago  ? for food sticking  ? FOOT SURGERY Right 04-27-2010   dr duda  ? joint arthrodesis  ? right foot surgery  2018  ? to remove screw  ? TOTAL KNEE ARTHROPLASTY Right 12/07/2017  ? Procedure: RIGHT TOTAL KNEE ARTHROPLASTY;  Surgeon: Mcarthur Rossetti, MD;  Location: [facility];  Service: Orthopedics;  Laterality: Right;  ? TRANSTHORACIC ECHOCARDIOGRAM  12-14-2015  dr taylor  ? moderate LVH, ef 55-60%, indeterminate diastolic function in setting of AFib/  moderate LAE and RAE/  mild to moderate calcific aoric stenosis with mild AR (valve area 1.33cm^2, peak grandient40mHg, mean grandient 173mg)/ trivial MR/ mild TR/ PASP 332m  ? VOLVULUS REDUCTION  [id number] ? ?Home Medications:  ?Allergies as of 02/07/2022   ?No Known Allergies ?  ? ?  ?Medication List  ?  ? ?  ? Accurate as of February 07, 2022  8:46 AM. If you have any questions, ask your nurse or doctor.  ?  ?  ? ?  ? ?  atorvastatin 20 MG tablet ?Commonly known as: LIPITOR ?TAKE 1 TABLET BY MOUTH ONCE A DAY. ?  ?carvedilol 3.125 MG tablet ?Commonly known as: COREG ?TAKE (1) TABLET BY MOUTH TWICE DAILY. ?  ?Co-Enzyme Q10 100 MG Caps ?Take by mouth. ?  ?ferrous sulfate 325 (65 FE) MG tablet ?Take 325 mg by mouth daily with breakfast. ?  ?Fish Oil 1000 MG Caps ?Take by mouth. ?  ?fluticasone 50 MCG/ACT nasal spray ?Commonly known as: FLONASE ?Place 1 spray into the nose daily. ?  ?furosemide 40 MG tablet ?Commonly known as: LASIX ?TAKE (1) TABLET BY MOUTH ONCE DAILY. ?  ?HYDROcodone-acetaminophen 5-325 MG tablet ?Commonly known as: NORCO/VICODIN ?  ?losartan 100 MG tablet ?Commonly known as: COZAAR ?Take 100 mg by mouth daily. ?  ?methocarbamol 500 MG tablet ?Commonly known as: ROBAXIN ?Take 1 tablet  (500 mg total) by mouth every 6 (six) hours as needed for muscle spasms. ?  ?omeprazole 20 MG capsule ?Commonly known as: PRILOSEC ?Take 20 mg by mouth at bedtime. ?  ?warfarin 5 MG tablet ?Commonly known as: COUMADIN ?Take as directed by the anticoagulation clinic. If you are unsure how to take this medication, talk to your nurse or doctor. ?Original instructions: Take 1 tablet daily or as directed by Coumadin Clinic ?  ? ?  ? ? ?Allergies: No Known Allergies ? ?Family History  ?Problem Relation Age of Onset  ? Heart disease Mother   ?     Pacemaker  ? Diabetes Father   ? Kidney failure Father   ? Colon cancer Neg Hx   ? ? ?Social History:  reports that he has never smoked. He has never used smokeless tobacco. He reports that he does not drink alcohol and does not use drugs. ? ?ROS: ?A complete review of systems was performed.  All systems are negative except for pertinent findings as noted. ? ?Physical Exam:  ?Vital signs in last 24 hours: ?There were no vitals taken for this visit. ?Constitutional:  Alert and oriented, No acute distress ?Cardiovascular: Regular rate  ?Respiratory: Normal respiratory effort ?GI: Abdomen obese.  Well-healed midline surgical scar.  Ventral hernia. ?Genitourinary: Normal male phallus, testes are descended bilaterally and non-tender and without masses, scrotum is normal in appearance without lesions or masses, perineum is normal on inspection.  Normal anal sphincter tone.  Prostate so 90 g, symmetric, nonnodular, nontender ?Lymphatic: No lymphadenopathy ?Neurologic: Grossly intact, no focal deficits ?Psychiatric: Normal mood and affect ? ?I have reviewed prior pt notes ? ?I have reviewed notes from referring/previous physicians--I reviewed 6 pages of records, no PSA results were noted. ? ?I have reviewed urinalysis results--looks like a UTI ? ?I have reviewed prior PSA results ? ?I have reviewed prior urine culture ? ? ?Impression/Assessment:  ?1.  BPH.  He does have a large benign  feeling gland ? ?2.  Elevated PSA-no biopsy as of yet.  7.2 is its highest level but he does have pyuria ? ?3.  Cystitis ? ?Plan:  ?1.  Urine was cultured.  We will call in appropriate antibiotic ? ?2.  I will have him drop by in 6 weeks for PSA recheck.  Follow-up dependent on that ? ?

## 2022-02-08 LAB — MICROSCOPIC EXAMINATION
RBC, Urine: NONE SEEN /hpf (ref 0–2)
Renal Epithel, UA: NONE SEEN /hpf
WBC, UA: >30 /hpf — AB (ref 0–5)

## 2022-02-08 LAB — URINALYSIS, ROUTINE W REFLEX MICROSCOPIC
Bilirubin, UA: NEGATIVE
Glucose, UA: NEGATIVE
Ketones, UA: NEGATIVE
Nitrite, UA: POSITIVE — AB
Protein,UA: NEGATIVE
RBC, UA: NEGATIVE
Specific Gravity, UA: 1.02 (ref 1.005–1.030)
Urobilinogen, Ur: 0.2 mg/dL (ref 0.2–1.0)
pH, UA: 5.5 (ref 5.0–7.5)

## 2022-02-14 ENCOUNTER — Ambulatory Visit (INDEPENDENT_AMBULATORY_CARE_PROVIDER_SITE_OTHER): Payer: Medicare Other | Admitting: *Deleted

## 2022-02-14 DIAGNOSIS — Z5181 Encounter for therapeutic drug level monitoring: Secondary | ICD-10-CM

## 2022-02-14 DIAGNOSIS — I480 Paroxysmal atrial fibrillation: Secondary | ICD-10-CM

## 2022-02-14 LAB — POCT INR: INR: 2.3 (ref 2.0–3.0)

## 2022-02-14 NOTE — Patient Instructions (Signed)
Continue warfarin 1 tablet daily °Recheck in 6 wks °

## 2022-02-16 ENCOUNTER — Telehealth: Payer: Self-pay

## 2022-02-16 DIAGNOSIS — N3000 Acute cystitis without hematuria: Secondary | ICD-10-CM | POA: Diagnosis not present

## 2022-02-16 NOTE — Telephone Encounter (Signed)
Daughter called inquiring on patients urine culture results.  ?Urine Culture order noted to be active.  ?Lab states she overlooked the order and it was not sent. ?Patient will come in today to drop off another specimen to send off for culture.  ?

## 2022-02-19 LAB — URINE CULTURE

## 2022-02-21 ENCOUNTER — Other Ambulatory Visit: Payer: Self-pay | Admitting: Urology

## 2022-02-21 DIAGNOSIS — N3000 Acute cystitis without hematuria: Secondary | ICD-10-CM

## 2022-02-21 MED ORDER — CEPHALEXIN 500 MG PO CAPS: 500.0000 mg | ORAL_CAPSULE | Freq: Two times a day (BID) | ORAL | 0 refills | Status: AC

## 2022-02-21 NOTE — Progress Notes (Signed)
Patient aware.

## 2022-03-15 ENCOUNTER — Telehealth: Payer: Self-pay

## 2022-03-15 NOTE — Telephone Encounter (Signed)
Patients daughter called and advised since patient is hard of hearing if you could call her once patients PSA results comes back after 6/6.    [name]  [phone]

## 2022-03-21 ENCOUNTER — Other Ambulatory Visit: Payer: Medicare Other

## 2022-03-21 DIAGNOSIS — R972 Elevated prostate specific antigen [PSA]: Secondary | ICD-10-CM | POA: Diagnosis not present

## 2022-03-22 LAB — PSA: Prostate Specific Ag, Serum: 6.3 ng/mL — ABNORMAL HIGH (ref 0.0–4.0)

## 2022-03-28 ENCOUNTER — Ambulatory Visit (INDEPENDENT_AMBULATORY_CARE_PROVIDER_SITE_OTHER): Payer: Medicare Other | Admitting: *Deleted

## 2022-03-28 DIAGNOSIS — Z5181 Encounter for therapeutic drug level monitoring: Secondary | ICD-10-CM | POA: Diagnosis not present

## 2022-03-28 DIAGNOSIS — I480 Paroxysmal atrial fibrillation: Secondary | ICD-10-CM

## 2022-03-28 LAB — POCT INR: INR: 3 (ref 2.0–3.0)

## 2022-03-28 NOTE — Patient Instructions (Signed)
Continue warfarin 1 tablet daily °Recheck in 6 wks °

## 2022-03-28 NOTE — Telephone Encounter (Signed)
Daughter called requesting psa results for patient.

## 2022-03-29 ENCOUNTER — Other Ambulatory Visit: Payer: Self-pay | Admitting: Urology

## 2022-03-29 DIAGNOSIS — R972 Elevated prostate specific antigen [PSA]: Secondary | ICD-10-CM

## 2022-03-30 NOTE — Telephone Encounter (Signed)
Daughter called and made aware.  OV and lab appointment made.

## 2022-04-19 DIAGNOSIS — G894 Chronic pain syndrome: Secondary | ICD-10-CM | POA: Diagnosis not present

## 2022-04-19 DIAGNOSIS — M159 Polyosteoarthritis, unspecified: Secondary | ICD-10-CM | POA: Diagnosis not present

## 2022-04-19 DIAGNOSIS — I10 Essential (primary) hypertension: Secondary | ICD-10-CM | POA: Diagnosis not present

## 2022-04-19 DIAGNOSIS — M5030 Other cervical disc degeneration, unspecified cervical region: Secondary | ICD-10-CM | POA: Diagnosis not present

## 2022-04-24 ENCOUNTER — Other Ambulatory Visit: Payer: Self-pay | Admitting: Internal Medicine

## 2022-04-24 DIAGNOSIS — I5032 Chronic diastolic (congestive) heart failure: Secondary | ICD-10-CM

## 2022-04-24 DIAGNOSIS — I480 Paroxysmal atrial fibrillation: Secondary | ICD-10-CM

## 2022-04-25 NOTE — Telephone Encounter (Signed)
Received refill request for warfarin:  Last INR was 3.0 on 03/28/22 Next INR is due on 05/09/22 LOV was 01/17/22  G TayD  Refill approved.

## 2022-05-09 ENCOUNTER — Ambulatory Visit (INDEPENDENT_AMBULATORY_CARE_PROVIDER_SITE_OTHER): Payer: Medicare Other | Admitting: *Deleted

## 2022-05-09 DIAGNOSIS — I480 Paroxysmal atrial fibrillation: Secondary | ICD-10-CM

## 2022-05-09 DIAGNOSIS — Z5181 Encounter for therapeutic drug level monitoring: Secondary | ICD-10-CM

## 2022-05-09 LAB — POCT INR: INR: 2.6 (ref 2.0–3.0)

## 2022-05-09 NOTE — Patient Instructions (Signed)
Continue warfarin 1 tablet daily °Recheck in 6 wks °

## 2022-05-17 ENCOUNTER — Encounter: Payer: Self-pay | Admitting: Allergy & Immunology

## 2022-05-17 ENCOUNTER — Ambulatory Visit: Payer: Medicare Other | Admitting: Allergy & Immunology

## 2022-05-17 VITALS — BP 150/86 | HR 55 | Temp 98.0°F | Resp 16 | Ht 71.0 in | Wt 281.5 lb

## 2022-05-17 DIAGNOSIS — J4541 Moderate persistent asthma with (acute) exacerbation: Secondary | ICD-10-CM

## 2022-05-17 DIAGNOSIS — J3089 Other allergic rhinitis: Secondary | ICD-10-CM

## 2022-05-17 DIAGNOSIS — J4540 Moderate persistent asthma, uncomplicated: Secondary | ICD-10-CM | POA: Diagnosis not present

## 2022-05-17 DIAGNOSIS — K9049 Malabsorption due to intolerance, not elsewhere classified: Secondary | ICD-10-CM | POA: Diagnosis not present

## 2022-05-17 MED ORDER — CETIRIZINE HCL 10 MG PO TABS: 10.0000 mg | ORAL_TABLET | Freq: Every day | ORAL | 5 refills | Status: AC | PRN

## 2022-05-17 MED ORDER — ALBUTEROL SULFATE HFA 108 (90 BASE) MCG/ACT IN AERS: 2.0000 | INHALATION_SPRAY | RESPIRATORY_TRACT | 1 refills | Status: AC | PRN

## 2022-05-17 MED ORDER — TRELEGY ELLIPTA 100-62.5-25 MCG/ACT IN AEPB: 1.0000 | INHALATION_SPRAY | Freq: Every day | RESPIRATORY_TRACT | 5 refills | Status: AC

## 2022-05-17 NOTE — Patient Instructions (Addendum)
1. Moderate persistent asthma with acute exacerbation - We are going to start you on a prednisone dose pack to see if this can reverse his spirometry (lung function) findings. - They were slightly low today and did not response to the Xopenex treatment. - I just want to see if we can reverse any of the lung function findings.  - Daily controller medication(s): Trelegy 100/62.5/25 one puff once daily EVERY DAY   - Prior to physical activity: albuterol 2 puffs 10-15 minutes before physical activity.   - Rescue medications: albuterol 4 puffs every 4-6 hours as needed   - Asthma control goals:  * Full participation in all desired activities (may need albuterol before activity) * Albuterol use two time or less a week on average (not counting use with activity) * Cough interfering with sleep two time or less a month * Oral steroids no more than once a year * No hospitalizations   2. Seasonal and perennial allergic rhinitis - Testing today showed: grasses, ragweed, weeds, trees, indoor molds, dust mites, and cockroach. - Copy of test results provided.  - These could certainly be leading to his symptoms.  - Avoidance measures provided. - Start taking: Zyrtec (cetirizine) '10mg'$  tablet once daily - You can use an extra dose of the antihistamine, if needed, for breakthrough symptoms.  - Consider nasal saline rinses 1-2 times daily to remove allergens from the nasal cavities as well as help with mucous clearance (this is especially helpful to do before the nasal sprays are given) - Consider allergy shots as a means of long-term control. - Allergy shots "re-train" and "reset" the immune system to ignore environmental allergens and decrease the resulting immune response to those allergens (sneezing, itchy watery eyes, runny nose, nasal congestion, etc).    - Allergy shots improve symptoms in 75-85% of patients.  - We can discuss more at the next appointment if the medications are not working for  you.  3. No follow-ups on file.    Please inform us of any Emergency Department visits, hospitalizations, or changes in symptoms. Call us before going to the ED for breathing or allergy symptoms since we might be able to fit you in for a sick visit. Feel free to contact us anytime with any questions, problems, or concerns.  It was a pleasure to meet you and your family today!  Websites that have reliable patient information: 1. American Academy of Asthma, Allergy, and Immunology: www.aaaai.org 2. Food Allergy Research and Education (FARE): foodallergy.org 3. Mothers of Asthmatics: http://www.asthmacommunitynetwork.org 4. American College of Allergy, Asthma, and Immunology: www.acaai.org   COVID-19 Vaccine Information can be found at: [email or web address] For questions related to vaccine distribution or appointments, please email [email or web address] or call [phone].   We realize that you might be concerned about having an allergic reaction to the COVID19 vaccines. To help with that concern, WE ARE OFFERING THE COVID19 VACCINES IN OUR OFFICE! Ask the front desk for dates!     "Like" [place] on Facebook and Instagram for our latest updates!      A healthy democracy works best when [employer] participate! Make sure you are registered to vote! If you have moved or changed any of your contact information, you will need to get this updated before voting!  In some cases, you MAY be able to register to vote online: [email or web address]      Airborne Adult Perc - 05/17/22 1009     Time Antigen Placed [place] Lavella Hammock  Location Back    Number of Test 59    1. Control-Buffer 50% Glycerol Negative    2. Control-Histamine 1 mg/ml 3+    3. Albumin saline Negative    4. Oakwood 3+    5. [place] 3+    6. Johnson 4+    7. Micro 4+    8. Meadow Fescue 4+    9. Perennial Rye  4+    10. Sweet Vernal 4+    11. Timothy 4+    12. Cocklebur --   -/+   13. Burweed Marshelder Negative    14. Ragweed, short Negative    15. Ragweed, Giant Negative    16. Plantain,  English 2+    17. Lamb's Quarters Negative    18. Sheep Sorrell Negative    19. Rough Pigweed Negative    20. Marsh Elder, Rough Negative    21. Mugwort, Common Negative    22. Ash mix Negative    23. Birch mix Negative    24. Beech American Negative    25. Box, Elder 2+    26. Cedar, red Negative    27. Cottonwood, [place] Negative    28. Elm mix Negative    29. Hickory 4+    30. Maple mix Negative    31. Oak, [place] mix Negative    32. Pecan Pollen 3+    33. Pine mix Negative    34. Sycamore Eastern Negative    35. [place], Black Pollen 4+    36. Alternaria alternata Negative    37. Cladosporium Herbarum Negative    38. Aspergillus mix Negative    39. Penicillium mix Negative    40. Bipolaris sorokiniana (Helminthosporium) Negative    41. Drechslera spicifera (Curvularia) Negative    42. Mucor plumbeus Negative    43. Fusarium moniliforme Negative    44. Aureobasidium pullulans (pullulara) Negative    45. Rhizopus oryzae Negative    46. Botrytis cinera Negative    47. Epicoccum nigrum Negative    48. Phoma betae Negative    49. Candida Albicans Negative    50. Trichophyton mentagrophytes Negative    51. Mite, D Farinae  5,000 AU/ml Negative    52. Mite, D Pteronyssinus  5,000 AU/ml Negative    53. Cat Hair 10,000 BAU/ml Negative    54.  Dog Epithelia Negative    55. Mixed Feathers Negative    56. Horse Epithelia Negative    57. Cockroach, German Negative    58. Mouse Negative    59. Tobacco Leaf Negative             Food Perc - 05/17/22 1009       Test Information   Time Antigen Placed 1009    Allergen Manufacturer Lavella Hammock    Location Back    Number of allergen test 10      Food   1. Peanut Negative    2. Soybean food Negative    3. Wheat, whole Negative    4. Sesame  Negative    5. Milk, cow Negative    6. Egg White, chicken Negative    7. Casein Negative    8. Shellfish mix Negative    9. Fish mix Negative    10. Cashew Negative             Intradermal - 05/17/22 1059     Time Antigen Placed 1059    Allergen Manufacturer Lavella Hammock    Location Arm    Number  of Test 11    Intradermal Select    Control Negative    Ragweed mix 2+    Weed mix 2+    Mold 1 Negative    Mold 2 Negative    Mold 3 Negative    Mold 4 3+    Cat Negative    Dog Negative    Cockroach 3+    Mite mix 4+            Reducing Pollen Exposure  The American Academy of Allergy, Asthma and Immunology suggests the following steps to reduce your exposure to pollen during allergy seasons.    Do not hang sheets or clothing out to dry; pollen may collect on these items. Do not mow lawns or spend time around freshly cut grass; mowing stirs up pollen. Keep windows closed at night.  Keep car windows closed while driving. Minimize morning activities outdoors, a time when pollen counts are usually at their highest. Stay indoors as much as possible when pollen counts or humidity is high and on windy days when pollen tends to remain in the air longer. Use air conditioning when possible.  Many air conditioners have filters that trap the pollen spores. Use a HEPA room air filter to remove pollen form the indoor air you breathe.  Control of Mold Allergen   Mold and fungi can grow on a variety of surfaces provided certain temperature and moisture conditions exist.  Outdoor molds grow on plants, decaying vegetation and soil.  The major outdoor mold, Alternaria and Cladosporium, are found in very high numbers during hot and dry conditions.  Generally, a late Summer - Fall peak is seen for common outdoor fungal spores.  Rain will temporarily lower outdoor mold spore count, but counts rise rapidly when the rainy period ends.  The most important indoor molds are Aspergillus and Penicillium.   Dark, humid and poorly ventilated basements are ideal sites for mold growth.  The next most common sites of mold growth are the bathroom and the kitchen.   Indoor (Perennial) Mold Control   Positive indoor molds via skin testing: Fusarium, Aureobasidium (Pullulara), and Rhizopus  Maintain humidity below 50%. Clean washable surfaces with 5% bleach solution. Remove sources e.g. contaminated carpets.    Control of Cockroach Allergen  Cockroach allergen has been identified as an important cause of acute attacks of asthma, especially in urban settings.  There are fifty-five species of cockroach that exist in the [place], however only three, the [place], [place] species produce allergen that can affect patients with Asthma.  Allergens can be obtained from fecal particles, egg casings and secretions from cockroaches.    Remove food sources. Reduce access to water. Seal access and entry points. Spray runways with 0.5-1% Diazinon or Chlorpyrifos Blow boric acid power under stoves and refrigerator. Place bait stations (hydramethylnon) at feeding sites.  Control of Dust Mite Allergen    Dust mites play a major role in allergic asthma and rhinitis.  They occur in environments with high humidity wherever human skin is found.  Dust mites absorb humidity from the atmosphere (ie, they do not drink) and feed on organic matter (including shed human and animal skin).  Dust mites are a microscopic type of insect that you cannot see with the naked eye.  High levels of dust mites have been detected from mattresses, pillows, carpets, upholstered furniture, bed covers, clothes, soft toys and any woven material.  The principal allergen of the dust mite is found in its  feces.  A gram of dust may contain 1,000 mites and 250,000 fecal particles.  Mite antigen is easily measured in the air during house cleaning activities.  Dust mites do not bite and do not cause harm to humans, other than by  triggering allergies/asthma.    Ways to decrease your exposure to dust mites in your home:  Encase mattresses, box springs and pillows with a mite-impermeable barrier or cover   Wash sheets, blankets and drapes weekly in hot water (130 F) with detergent and dry them in a dryer on the hot setting.  Have the room cleaned frequently with a vacuum cleaner and a damp dust-mop.  For carpeting or rugs, vacuuming with a vacuum cleaner equipped with a high-efficiency particulate air (HEPA) filter.  The dust mite allergic individual should not be in a room which is being cleaned and should wait 1 hour after cleaning before going into the room. Do not sleep on upholstered furniture (eg, couches).   If possible removing carpeting, upholstered furniture and drapery from the home is ideal.  Horizontal blinds should be eliminated in the rooms where the person spends the most time (bedroom, study, television room).  Washable vinyl, roller-type shades are optimal. Remove all non-washable stuffed toys from the bedroom.  Wash stuffed toys weekly like sheets and blankets above.   Reduce indoor humidity to less than 50%.  Inexpensive humidity monitors can be purchased at most hardware stores.  Do not use a humidifier as can make the problem worse and are not recommended.  Allergy Shots   Allergies are the result of a chain reaction that starts in the immune system. Your immune system controls how your body defends itself. For instance, if you have an allergy to pollen, your immune system identifies pollen as an invader or allergen. Your immune system overreacts by producing antibodies called Immunoglobulin E (IgE). These antibodies travel to cells that release chemicals, causing an allergic reaction.  The concept behind allergy immunotherapy, whether it is received in the form of shots or tablets, is that the immune system can be desensitized to specific allergens that trigger allergy symptoms. Although it requires time  and patience, the payback can be long-term relief.  How Do Allergy Shots Work?  Allergy shots work much like a vaccine. Your body responds to injected amounts of a particular allergen given in increasing doses, eventually developing a resistance and tolerance to it. Allergy shots can lead to decreased, minimal or no allergy symptoms.  There generally are two phases: build-up and maintenance. Build-up often ranges from three to six months and involves receiving injections with increasing amounts of the allergens. The shots are typically given once or twice a week, though more rapid build-up schedules are sometimes used.  The maintenance phase begins when the most effective dose is reached. This dose is different for each person, depending on how allergic you are and your response to the build-up injections. Once the maintenance dose is reached, there are longer periods between injections, typically two to four weeks.  Occasionally doctors give cortisone-type shots that can temporarily reduce allergy symptoms. These types of shots are different and should not be confused with allergy immunotherapy shots.  Who Can Be Treated with Allergy Shots?  Allergy shots may be a good treatment approach for people with allergic rhinitis (hay fever), allergic asthma, conjunctivitis (eye allergy) or stinging insect allergy.   Before deciding to begin allergy shots, you should consider:   The length of allergy season and the severity of  your symptoms  Whether medications and/or changes to your environment can control your symptoms  Your desire to avoid long-term medication use  Time: allergy immunotherapy requires a major time commitment  Cost: may vary depending on your insurance coverage  Allergy shots for children age 55 and older are effective and often well tolerated. They might prevent the onset of new allergen sensitivities or the progression to asthma.  Allergy shots are not started on patients who  are pregnant but can be continued on patients who become pregnant while receiving them. In some patients with other medical conditions or who take certain common medications, allergy shots may be of risk. It is important to mention other medications you talk to your allergist.   When Will I Feel Better?  Some may experience decreased allergy symptoms during the build-up phase. For others, it may take as long as 12 months on the maintenance dose. If there is no improvement after a year of maintenance, your allergist will discuss other treatment options with you.  If you aren't responding to allergy shots, it may be because there is not enough dose of the allergen in your vaccine or there are missing allergens that were not identified during your allergy testing. Other reasons could be that there are high levels of the allergen in your environment or major exposure to non-allergic triggers like tobacco smoke.  What Is the Length of Treatment?  Once the maintenance dose is reached, allergy shots are generally continued for three to five years. The decision to stop should be discussed with your allergist at that time. Some people may experience a permanent reduction of allergy symptoms. Others may relapse and a longer course of allergy shots can be considered.  What Are the Possible Reactions?  The two types of adverse reactions that can occur with allergy shots are local and systemic. Common local reactions include very mild redness and swelling at the injection site, which can happen immediately or several hours after. A systemic reaction, which is less common, affects the entire body or a particular body system. They are usually mild and typically respond quickly to medications. Signs include increased allergy symptoms such as sneezing, a stuffy nose or hives.  Rarely, a serious systemic reaction called anaphylaxis can develop. Symptoms include swelling in the throat, wheezing, a feeling of tightness  in the chest, nausea or dizziness. Most serious systemic reactions develop within 30 minutes of allergy shots. This is why it is strongly recommended you wait in your doctor's office for 30 minutes after your injections. Your allergist is trained to watch for reactions, and his or her staff is trained and equipped with the proper medications to identify and treat them.  Who Should Administer Allergy Shots?  The preferred location for receiving shots is your prescribing allergist's office. Injections can sometimes be given at another facility where the physician and staff are trained to recognize and treat reactions, and have received instructions by your prescribing allergist.

## 2022-05-17 NOTE — Progress Notes (Signed)
NEW PATIENT  Date of Service/Encounter:  05/17/22  Consult requested by: Redmond School, MD   Assessment:   Moderate persistent asthma with acute exacerbation   Seasonal and perennial allergic rhinitis (grasses, ragweed, weeds, trees, indoor molds, dust mites, and cockroach)  Atrial fibrillation - on carvedilol and Coumadin  Never smoker   [name] presents for an evaluation of coughing and wheezing.  While he denies any allergic rhinitis symptoms, I think he is just an under procedure.  His testing today surprisingly shows quite a bit of environmental allergy triggers.  I think these could be related to his wheezing episodes.  This fits with his history, since he only has symptoms after being outside and mowing.  I am going to keep him on the Trelegy for now.  I think that we could possibly stepdown the medication once we get him on a regular antihistamine.  Allergy shots are an option, but with his history of atrial fibrillation and his current beta-blocker usage, he would be more high risk.  I am going to see him back in 2 weeks to see if we can reverse some of the spirometry findings.  I hope that some of his spirometry findings are reversible and he does not have any permanent affect and figure this out all I will get ready to catch skin changes.  Plan/Recommendations:    1. Moderate persistent asthma with acute exacerbation - We are going to start you on a prednisone dose pack to see if this can reverse his spirometry (lung function) findings. - They were slightly low today and did not response to the Xopenex treatment. - I just want to see if we can reverse any of the lung function findings.  - Daily controller medication(s): Trelegy 100/62.5/25 one puff once daily EVERY DAY - Prior to physical activity: albuterol 2 puffs 10-15 minutes before physical activity. - Rescue medications: albuterol 4 puffs every 4-6 hours as needed - Asthma control goals:  * Full  participation in all desired activities (may need albuterol before activity) * Albuterol use two time or less a week on average (not counting use with activity) * Cough interfering with sleep two time or less a month * Oral steroids no more than once a year * No hospitalizations  2. Seasonal and perennial allergic rhinitis - Testing today showed: grasses, ragweed, weeds, trees, indoor molds, dust mites, and cockroach. - Copy of test results provided.  - These could certainly be leading to his symptoms.  - Avoidance measures provided. - Start taking: Zyrtec (cetirizine) '10mg'$  tablet once daily - You can use an extra dose of the antihistamine, if needed, for breakthrough symptoms.  - Consider nasal saline rinses 1-2 times daily to remove allergens from the nasal cavities as well as help with mucous clearance (this is especially helpful to do before the nasal sprays are given) - Consider allergy shots as a means of long-term control. - Allergy shots "re-train" and "reset" the immune system to ignore environmental allergens and decrease the resulting immune response to those allergens (sneezing, itchy watery eyes, runny nose, nasal congestion, etc).    - Allergy shots improve symptoms in 75-85% of patients.  - We can discuss more at the next appointment if the medications are not working for you.  3.  Follow-up in 2 weeks.    This note in its entirety was forwarded to the Provider who requested this consultation.  Subjective:   Brent Mason is a 76 y.o. male presenting today  for evaluation of  Chief Complaint  Patient presents with   Wheezing    Has been wheezing a lot recently, at night is when it gets really bad. Is an outdoors person. Has some cough.    Allergy Testing    Rabbit- 40 years ago would cause him to pass out    Brent Mason has a history of the following: Patient Active Problem List   Diagnosis Date Noted   Status post total right knee replacement 12/07/2017    Unilateral primary osteoarthritis, right knee 11/05/2017   Primary osteoarthritis of both knees 05/28/2017   S/P foot surgery, right 12/05/2016   Pain in right foot 12/05/2016   Pain from implanted hardware 12/05/2016   Bilateral carotid bruits 07/08/2014   Encounter for therapeutic drug monitoring 11/13/2013   Claudication of left lower extremity ([place]) 12/02/2012   Neck pain 09/16/2012   Cervical spondylosis without myelopathy 09/10/2012   Hx of adenomatous colonic polyps 05/07/2012   Rectal bleeding 05/07/2012   GERD (gastroesophageal reflux disease) 05/07/2012   Long term current use of anticoagulant 01/25/2011   HYPERLIPIDEMIA 11/21/2010   DYSPNEA 10/20/2010   MORBID OBESITY 09/01/2009   Atrial fibrillation ([place]) 09/01/2009   Essential hypertension 06/14/2009   ESOPHAGEAL STRICTURE 06/14/2009   DIZZINESS 06/14/2009    History obtained from: chart review and patient and his daughter .  Brent Mason was referred by Redmond School, MD.     Brent Mason is a 76 y.o. male presenting for an evaluation of wheezing/respiratory problems .   Asthma/Respiratory Symptom History: He has been wheezing a lot. 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 He likes to [employer]. They have a cat and  this seems to make things worse. He apparently has Trelegy but it is unclear how often he is using it. Daughter thinks that this has been going on for at least 5 years. It has been at least 3 years. He is a never smoker, but he did work at a [employer] for 33 years. Symptoms are mostly in the evenings.   Allergic Rhinitis Symptom History: He had allergy testing done 30 years ago. He was rabbit hunting and ended being allergic to rabbits. He had shots for a period of time, but this was decades ago.  He does have some intermittent coughing. Symptoms overall seems to be more irritating to the family. He does not take any antihistamines at all. He is not on Flonase.   He has hypertension.  He also has atrial fibrillation. He is on coumadin and gets checks every 6 weeks.   Otherwise, there is no history of other atopic diseases, including food allergies, drug allergies, stinging insect allergies, eczema, urticaria, or contact dermatitis. There is no significant infectious history. Vaccinations are up to date.    Past Medical History: Patient Active Problem List   Diagnosis Date Noted   Status post total right knee replacement 12/07/2017   Unilateral primary osteoarthritis, right knee 11/05/2017   Primary osteoarthritis of both knees 05/28/2017   S/P foot surgery, right 12/05/2016   Pain in right foot 12/05/2016   Pain from implanted hardware 12/05/2016   Bilateral carotid bruits 07/08/2014   Encounter for therapeutic drug monitoring 11/13/2013   Claudication of left lower extremity ([place]) 12/02/2012   Neck pain 09/16/2012   Cervical spondylosis without myelopathy 09/10/2012   Hx of adenomatous colonic polyps 05/07/2012   Rectal bleeding 05/07/2012   GERD (gastroesophageal reflux disease) 05/07/2012   Long  term current use of anticoagulant 01/25/2011   HYPERLIPIDEMIA 11/21/2010   DYSPNEA 10/20/2010   MORBID OBESITY 09/01/2009   Atrial fibrillation ([place]) 09/01/2009   Essential hypertension 06/14/2009   ESOPHAGEAL STRICTURE 06/14/2009   DIZZINESS 06/14/2009    Medication List:  Allergies as of 05/17/2022   No Known Allergies      Medication List        Accurate as of May 17, 2022 12:50 PM. If you have any questions, ask your nurse or doctor.          albuterol 108 (90 Base) MCG/ACT inhaler Commonly known as: Ventolin HFA Inhale 2 puffs into the lungs every 4 (four) hours as needed for wheezing or shortness of breath. Started by: Valentina Shaggy, MD   atorvastatin 20 MG tablet Commonly known as: LIPITOR TAKE 1 TABLET BY MOUTH ONCE A DAY.   carvedilol 3.125 MG tablet Commonly known as: COREG TAKE (1) TABLET BY MOUTH TWICE DAILY.   cetirizine  10 MG tablet Commonly known as: ZYRTEC Take 1 tablet (10 mg total) by mouth daily as needed for allergies (Can take an extra dose during flare ups.). Started by: Valentina Shaggy, MD   Co-Enzyme Q10 100 MG Caps Take by mouth.   ferrous sulfate 325 (65 FE) MG tablet Take 325 mg by mouth daily with breakfast.   Fish Oil 1000 MG Caps Take by mouth.   fluticasone 50 MCG/ACT nasal spray Commonly known as: FLONASE Place 1 spray into the nose daily.   furosemide 40 MG tablet Commonly known as: LASIX TAKE (1) TABLET BY MOUTH ONCE DAILY.   HYDROcodone-acetaminophen 5-325 MG tablet Commonly known as: NORCO/VICODIN   losartan 100 MG tablet Commonly known as: COZAAR Take 100 mg by mouth daily.   methocarbamol 500 MG tablet Commonly known as: ROBAXIN Take 1 tablet (500 mg total) by mouth every 6 (six) hours as needed for muscle spasms.   omeprazole 20 MG capsule Commonly known as: PRILOSEC Take 20 mg by mouth at bedtime.   Trelegy Ellipta 100-62.5-25 MCG/ACT Aepb Generic drug: Fluticasone-Umeclidin-Vilant Inhale 1 puff into the lungs daily.   warfarin 5 MG tablet Commonly known as: COUMADIN Take as directed by the anticoagulation clinic. If you are unsure how to take this medication, talk to your nurse or doctor. Original instructions: TAKE (1) TABLET BY MOUTH ONCE DAILY OR AS DIRECTED BY COUMADIN CLINIC.        Birth History: non-contributory  Developmental History: non-contributory  Past Surgical History: Past Surgical History:  Procedure Laterality Date   ABDOMINAL HERNIA REPAIR  1990s   ANTERIOR CERVICAL DECOMP/DISCECTOMY FUSION N/A 02/14/2013   Procedure: ANTERIOR CERVICAL DECOMPRESSION/DISCECTOMY FUSION 2 LEVELS;  Surgeon: Charlie Pitter, MD;  Location: [facility];  Service: Neurosurgery;  Laterality: N/A;   BALLOON DILATION N/A 12/11/2017   Procedure: BALLOON DILATION;  Surgeon: Clarene Essex, MD;  Location: [facility];  Service: Endoscopy;  Laterality: N/A;    COLONOSCOPY  last one 06-03-2012   COLONOSCOPY  06/03/2012   Procedure: COLONOSCOPY;  Surgeon: Daneil Dolin, MD;  Location: [facility];  Service: Endoscopy;  Laterality: N/A;  8:30   esophageal strecching  yrs ago   ESOPHAGOGASTRODUODENOSCOPY (EGD) WITH PROPOFOL N/A 12/11/2017   Procedure: ESOPHAGOGASTRODUODENOSCOPY (EGD) WITH PROPOFOL;  Surgeon: Clarene Essex, MD;  Location: [facility];  Service: Endoscopy;  Laterality: N/A;   ESOPHAGUS SURGERY  20 yrs ago   for food sticking   FOOT SURGERY Right 04-27-2010   dr duda   joint  arthrodesis   right foot surgery  2018   to remove screw   TOTAL KNEE ARTHROPLASTY Right 12/07/2017   Procedure: RIGHT TOTAL KNEE ARTHROPLASTY;  Surgeon: Mcarthur Rossetti, MD;  Location: [facility];  Service: Orthopedics;  Laterality: Right;   TRANSTHORACIC ECHOCARDIOGRAM  12-14-2015  dr taylor   moderate LVH, ef 55-60%, indeterminate diastolic function in setting of AFib/  moderate LAE and RAE/  mild to moderate calcific aoric stenosis with mild AR (valve area 1.33cm^2, peak grandient27mHg, mean grandient 177mg)/ trivial MR/ mild TR/ PASP 3344m   VOLVULUS REDUCTION  [id number]  Family History: Family History  Problem Relation Age of Onset   Allergic rhinitis Mother    Heart disease Mother        Pacemaker   Diabetes Father    Kidney failure Father    Colon cancer Neg Hx    Angioedema Neg Hx    Asthma Neg Hx    Eczema Neg Hx      Social History: [employer] at home by himself.  He lives in a house that is 38 30ars old.  There are hardwoods throughout the home.  He has gas heating and central cooling.  There are no animals inside or outside of the home.  There are dust mite covers on the bedding.  There is no tobacco exposure.  He is currently retired.   Review of Systems  Constitutional: Negative.  Negative for fever, malaise/fatigue and weight loss.  HENT: Negative.  Negative for congestion, ear discharge and ear pain.   Eyes:  Negative for  pain, discharge and redness.  Respiratory:  Positive for cough, shortness of breath and wheezing. Negative for sputum production.   Cardiovascular: Negative.  Negative for chest pain and palpitations.  Gastrointestinal:  Negative for abdominal pain, heartburn, nausea and vomiting.  Skin: Negative.  Negative for itching and rash.  Neurological:  Negative for dizziness and headaches.  Endo/Heme/Allergies:  Negative for environmental allergies. Does not bruise/bleed easily.       Objective:   Blood pressure (!) 150/86, pulse (!) 55, temperature 98 F (36.7 C), resp. rate 16, height '5\' 11"'$  (1.803 m), weight 281 lb 8 oz (127.7 kg), SpO2 96 %. Body mass index is 39.26 kg/m.     Physical Exam Vitals reviewed.  Constitutional:      Appearance: He is well-developed.     Comments: Hard of hearing.  HENT:     Head: Normocephalic and atraumatic.     Right Ear: Tympanic membrane, ear canal and external ear normal. No drainage, swelling or tenderness. Tympanic membrane is not injected, scarred, erythematous, retracted or bulging.     Left Ear: Tympanic membrane, ear canal and external ear normal. No drainage, swelling or tenderness. Tympanic membrane is not injected, scarred, erythematous, retracted or bulging.     Ears:     Comments: Hearing aids in place bilaterally.    Nose: No nasal deformity, septal deviation, mucosal edema or rhinorrhea.     Right Turbinates: Enlarged, swollen and pale.     Left Turbinates: Enlarged, swollen and pale.     Right Sinus: No maxillary sinus tenderness or frontal sinus tenderness.     Left Sinus: No maxillary sinus tenderness or frontal sinus tenderness.     Mouth/Throat:     Mouth: Mucous membranes are not pale and not dry.     Pharynx: Uvula midline.  Eyes:     General: Lids are normal. Allergic shiner present.  Right eye: No discharge.        Left eye: No discharge.     Conjunctiva/sclera: Conjunctivae normal.     Right eye: Right conjunctiva  is not injected. No chemosis.    Left eye: Left conjunctiva is not injected. No chemosis.    Pupils: Pupils are equal, round, and reactive to light.  Cardiovascular:     Rate and Rhythm: Normal rate and regular rhythm.     Heart sounds: Normal heart sounds.  Pulmonary:     Effort: Pulmonary effort is normal. No tachypnea, accessory muscle usage or respiratory distress.     Breath sounds: Normal breath sounds. No wheezing, rhonchi or rales.     Comments: Moving air well in all lung fields.  No increased work of breathing. Chest:     Chest wall: No tenderness.  Abdominal:     Tenderness: There is no abdominal tenderness. There is no guarding or rebound.  Lymphadenopathy:     Head:     Right side of head: No submandibular, tonsillar or occipital adenopathy.     Left side of head: No submandibular, tonsillar or occipital adenopathy.     Cervical: No cervical adenopathy.  Skin:    General: Skin is warm.     Capillary Refill: Capillary refill takes less than 2 seconds.     Coloration: Skin is not pale.     Findings: No abrasion, erythema, petechiae or rash. Rash is not papular, urticarial or vesicular.     Comments: No eczematous or urticarial lesions noted.  Neurological:     Mental Status: He is alert.  Psychiatric:        Behavior: Behavior is cooperative.      Diagnostic studies:    Spirometry: results abnormal (FEV1: 1.92/63.%, FVC: 2.35/57%, FEV1/FVC: 82%).    Spirometry consistent with possible restrictive disease. Xopenex four puffs via MDI treatment given in clinic with no improvement.  Allergy Studies:    Airborne Adult Perc - 05/17/22 1009     Time Antigen Placed 1009    Allergen Manufacturer Lavella Hammock    Location Back    Number of Test 59    1. Control-Buffer 50% Glycerol Negative    2. Control-Histamine 1 mg/ml 3+    3. Albumin saline Negative    4. [place] 3+    5. [place] 3+    6. Johnson 4+    7. Randall 4+    8. Meadow Fescue 4+    9. Perennial Rye 4+     10. Sweet Vernal 4+    11. Timothy 4+    12. Cocklebur --   -/+   13. Burweed Marshelder Negative    14. Ragweed, short Negative    15. Ragweed, Giant Negative    16. Plantain,  English 2+    17. Lamb's Quarters Negative    18. Sheep Sorrell Negative    19. Rough Pigweed Negative    20. Marsh Elder, Rough Negative    21. Mugwort, Common Negative    22. Ash mix Negative    23. Birch mix Negative    24. Beech American Negative    25. Box, Elder 2+    26. Cedar, red Negative    27. Cottonwood, [place] Negative    28. Elm mix Negative    29. Hickory 4+    30. Maple mix Negative    31. Oak, [place] mix Negative    32. Pecan Pollen 3+    33. Pine mix Negative  Lares Negative    35. Cumberland, Black Pollen 4+    36. Alternaria alternata Negative    37. Cladosporium Herbarum Negative    38. Aspergillus mix Negative    39. Penicillium mix Negative    40. Bipolaris sorokiniana (Helminthosporium) Negative    41. Drechslera spicifera (Curvularia) Negative    42. Mucor plumbeus Negative    43. Fusarium moniliforme Negative    44. Aureobasidium pullulans (pullulara) Negative    45. Rhizopus oryzae Negative    46. Botrytis cinera Negative    47. Epicoccum nigrum Negative    48. Phoma betae Negative    49. Candida Albicans Negative    50. Trichophyton mentagrophytes Negative    51. Mite, D Farinae  5,000 AU/ml Negative    52. Mite, D Pteronyssinus  5,000 AU/ml Negative    53. Cat Hair 10,000 BAU/ml Negative    54.  Dog Epithelia Negative    55. Mixed Feathers Negative    56. Horse Epithelia Negative    57. Cockroach, German Negative    58. Mouse Negative    59. Tobacco Leaf Negative             Food Perc - 05/17/22 1009       Test Information   Time Antigen Placed 1009    Allergen Manufacturer Lavella Hammock    Location Back    Number of allergen test 10      Food   1. Peanut Negative    2. Soybean food Negative    3. Wheat, whole Negative    4. Sesame  Negative    5. Milk, cow Negative    6. Egg White, chicken Negative    7. Casein Negative    8. Shellfish mix Negative    9. Fish mix Negative    10. Cashew Negative             Intradermal - 05/17/22 1059     Time Antigen Placed 1059    Allergen Manufacturer Lavella Hammock    Location Arm    Number of Test 11    Intradermal Select    Control Negative    Ragweed mix 2+    Weed mix 2+    Mold 1 Negative    Mold 2 Negative    Mold 3 Negative    Mold 4 3+    Cat Negative    Dog Negative    Cockroach 3+    Mite mix 4+             Allergy testing results were read and interpreted by myself, documented by clinical staff.         Salvatore Marvel, MD [facility] [place] [facility] [place]

## 2022-06-07 ENCOUNTER — Encounter: Payer: Self-pay | Admitting: Family

## 2022-06-07 ENCOUNTER — Other Ambulatory Visit: Payer: Self-pay

## 2022-06-07 ENCOUNTER — Ambulatory Visit (INDEPENDENT_AMBULATORY_CARE_PROVIDER_SITE_OTHER): Payer: Medicare Other | Admitting: Family

## 2022-06-07 VITALS — BP 146/84 | HR 66 | Resp 15

## 2022-06-07 DIAGNOSIS — I4891 Unspecified atrial fibrillation: Secondary | ICD-10-CM

## 2022-06-07 DIAGNOSIS — J3089 Other allergic rhinitis: Secondary | ICD-10-CM

## 2022-06-07 DIAGNOSIS — J302 Other seasonal allergic rhinitis: Secondary | ICD-10-CM

## 2022-06-07 DIAGNOSIS — J4540 Moderate persistent asthma, uncomplicated: Secondary | ICD-10-CM

## 2022-06-07 NOTE — Patient Instructions (Addendum)
1. Moderate persistent asthma   - Daily controller medication(s): Trelegy 100/62.5/25 one puff once daily EVERY DAY. We will not make any changes to your inhaler right now since you are not wanting to try to decrease.   - Prior to physical activity: albuterol 2 puffs 10-15 minutes before physical activity.   - Rescue medications: albuterol 4 puffs every 4-6 hours as needed   - Asthma control goals:  * Full participation in all desired activities (may need albuterol before activity) * Albuterol use two time or less a week on average (not counting use with activity) * Cough interfering with sleep two time or less a month * Oral steroids no more than once a year * No hospitalizations   2. Seasonal and perennial allergic rhinitis - Testing on 05/17/22 was positive to: grasses, ragweed, weeds, trees, indoor molds, dust mites, and cockroach. -- Continue Zyrtec (cetirizine) '10mg'$  tablet once daily - You can use an extra dose of the antihistamine, if needed, for breakthrough symptoms.  - Consider nasal saline rinses 1-2 times daily to remove allergens from the nasal cavities as well as help with mucous clearance (this is especially helpful to do before the nasal sprays are given) - Consider allergy shots as a means of long-term control. - Allergy shots "re-train" and "reset" the immune system to ignore environmental allergens and decrease the resulting immune response to those allergens (sneezing, itchy watery eyes, runny nose, nasal congestion, etc).    - Allergy shots improve symptoms in 75-85% of patients.  - We can discuss more at the next appointment if the medications are not working for you.  3. Schedule a follow up appointment in 3 months or sooner if needed      Reducing Pollen Exposure  The American Academy of Allergy, Asthma and Immunology suggests the following steps to reduce your exposure to pollen during allergy seasons.    Do not hang sheets or clothing out to dry; pollen  may collect on these items. Do not mow lawns or spend time around freshly cut grass; mowing stirs up pollen. Keep windows closed at night.  Keep car windows closed while driving. Minimize morning activities outdoors, a time when pollen counts are usually at their highest. Stay indoors as much as possible when pollen counts or humidity is high and on windy days when pollen tends to remain in the air longer. Use air conditioning when possible.  Many air conditioners have filters that trap the pollen spores. Use a HEPA room air filter to remove pollen form the indoor air you breathe.  Control of Mold Allergen   Mold and fungi can grow on a variety of surfaces provided certain temperature and moisture conditions exist.  Outdoor molds grow on plants, decaying vegetation and soil.  The major outdoor mold, Alternaria and Cladosporium, are found in very high numbers during hot and dry conditions.  Generally, a late Summer - Fall peak is seen for common outdoor fungal spores.  Rain will temporarily lower outdoor mold spore count, but counts rise rapidly when the rainy period ends.  The most important indoor molds are Aspergillus and Penicillium.  Dark, humid and poorly ventilated basements are ideal sites for mold growth.  The next most common sites of mold growth are the bathroom and the kitchen.   Indoor (Perennial) Mold Control   Positive indoor molds via skin testing: Fusarium, Aureobasidium (Pullulara), and Rhizopus  Maintain humidity below 50%. Clean washable surfaces with 5% bleach solution. Remove sources e.g. contaminated carpets.  Control of Cockroach Allergen  Cockroach allergen has been identified as an important cause of acute attacks of asthma, especially in urban settings.  There are fifty-five species of cockroach that exist in the [place], however only three, the [place], [place] species produce allergen that can affect patients with Asthma.  Allergens can be  obtained from fecal particles, egg casings and secretions from cockroaches.    Remove food sources. Reduce access to water. Seal access and entry points. Spray runways with 0.5-1% Diazinon or Chlorpyrifos Blow boric acid power under stoves and refrigerator. Place bait stations (hydramethylnon) at feeding sites.  Control of Dust Mite Allergen    Dust mites play a major role in allergic asthma and rhinitis.  They occur in environments with high humidity wherever human skin is found.  Dust mites absorb humidity from the atmosphere (ie, they do not drink) and feed on organic matter (including shed human and animal skin).  Dust mites are a microscopic type of insect that you cannot see with the naked eye.  High levels of dust mites have been detected from mattresses, pillows, carpets, upholstered furniture, bed covers, clothes, soft toys and any woven material.  The principal allergen of the dust mite is found in its feces.  A gram of dust may contain 1,000 mites and 250,000 fecal particles.  Mite antigen is easily measured in the air during house cleaning activities.  Dust mites do not bite and do not cause harm to humans, other than by triggering allergies/asthma.    Ways to decrease your exposure to dust mites in your home:  Encase mattresses, box springs and pillows with a mite-impermeable barrier or cover   Wash sheets, blankets and drapes weekly in hot water (130 F) with detergent and dry them in a dryer on the hot setting.  Have the room cleaned frequently with a vacuum cleaner and a damp dust-mop.  For carpeting or rugs, vacuuming with a vacuum cleaner equipped with a high-efficiency particulate air (HEPA) filter.  The dust mite allergic individual should not be in a room which is being cleaned and should wait 1 hour after cleaning before going into the room. Do not sleep on upholstered furniture (eg, couches).   If possible removing carpeting, upholstered furniture and drapery from the home  is ideal.  Horizontal blinds should be eliminated in the rooms where the person spends the most time (bedroom, study, television room).  Washable vinyl, roller-type shades are optimal. Remove all non-washable stuffed toys from the bedroom.  Wash stuffed toys weekly like sheets and blankets above.   Reduce indoor humidity to less than 50%.  Inexpensive humidity monitors can be purchased at most hardware stores.  Do not use a humidifier as can make the problem worse and are not recommended.

## 2022-06-07 NOTE — Progress Notes (Signed)
[address] [place] [address] Dept: [phone]  FOLLOW UP NOTE  Patient ID: Brent Mason, male    DOB: 1945-11-18  Age: 76 y.o. MRN: [id number] Date of Office Visit: 06/07/2022  Assessment  Chief Complaint: Asthma and Allergic Rhinitis   HPI Brent Mason is a 76 year old male who presents today for follow-up of moderate persistent asthma with acute exacerbation, seasonal and perennial allergic rhinitis, atrial fibrillation-on carvedilol and Coumadin, and never a smoker.  Since his last office visit he denies any new diagnosis or surgeries.  Moderate persistent asthma: He reports that his breathing is doing better.  He denies cough, wheeze, tightness in chest, shortness of breath, and nocturnal awakenings due to breathing problems.  Since his last office visit he has not made any trips to the emergency room or urgent care due to breathing problems.  He has not had any other steroids other than the ones that were prescribed at his last office visit.  He has not had to use his albuterol inhaler any since we last saw him.  He continues to take Trelegy 100 mcg 1 puff once a day.  Seasonal and perennial allergic rhinitis: He thinks that he is taking Zyrtec 10 mg once a day.  He denies rhinorrhea, nasal congestion, and postnasal drip.  He has not had any sinus infections since we last saw him.  He does mention that earlier today he did take tweezers and pulled some hairs out of his right nostril due to them bothering him.   Drug Allergies:  No Known Allergies  Review of Systems: Review of Systems  Constitutional:  Negative for chills and fever.  HENT:         Denies rhinorrhea, nasal congestion, and postnasal drip.  Eyes:        Denies itchy watery eyes  Respiratory:  Negative for cough, shortness of breath and wheezing.        Demoes cough, wheeze, tightness in chest, shortness of breath, and nocturnal awakenings due to breathing problems  Cardiovascular:   Negative for chest pain and palpitations.  Gastrointestinal:        Denies heartburn or reflux symptoms  Genitourinary:  Negative for frequency.  Skin:  Negative for itching and rash.  Neurological:  Negative for headaches.  Endo/Heme/Allergies:  Positive for environmental allergies.     Physical Exam: BP (!) 146/84   Pulse 66   Resp 15   SpO2 96%    Physical Exam Constitutional:      Appearance: Normal appearance.     Comments: Hard of hearing- wears hearing aids  HENT:     Head: Normocephalic and atraumatic.     Comments: Pharynx normal, eyes normal, ears: Unable to see left tympanic membrane due to cerumen.  Right ear normal.  Nose: Blood noted in right nostril.  Left nostril normal    Right Ear: Tympanic membrane, ear canal and external ear normal.     Left Ear: Ear canal and external ear normal.     Mouth/Throat:     Mouth: Mucous membranes are moist.     Pharynx: Oropharynx is clear.  Eyes:     Conjunctiva/sclera: Conjunctivae normal.  Cardiovascular:     Rate and Rhythm: Normal rate and regular rhythm.     Heart sounds: Normal heart sounds.  Pulmonary:     Effort: Pulmonary effort is normal.     Breath sounds: Normal breath sounds.     Comments: Lungs clear to auscultation Musculoskeletal:  Cervical back: Neck supple.  Skin:    General: Skin is warm.  Neurological:     Mental Status: He is alert and oriented to person, place, and time.  Psychiatric:        Mood and Affect: Mood normal.        Behavior: Behavior normal.        Thought Content: Thought content normal.        Judgment: Judgment normal.     Diagnostics: FVC 2.11 L (51%), FEV1 1.93 L (63%).  Predicted FVC 4.14 L, predicted FEV1 3.07 L.  Spirometry indicates possible moderate restriction.  There is really no change from previous spirometry  Assessment and Plan: 1. Moderate persistent asthma without complication   2. Seasonal and perennial allergic rhinitis   3. Atrial fibrillation,  unspecified type ([place])     No orders of the defined types were placed in this encounter.   Patient Instructions  1. Moderate persistent asthma   - Daily controller medication(s): Trelegy 100/62.5/25 one puff once daily EVERY DAY. We will not make any changes to your inhaler right now since you are not wanting to try to decrease.   - Prior to physical activity: albuterol 2 puffs 10-15 minutes before physical activity.   - Rescue medications: albuterol 4 puffs every 4-6 hours as needed   - Asthma control goals:  * Full participation in all desired activities (may need albuterol before activity) * Albuterol use two time or less a week on average (not counting use with activity) * Cough interfering with sleep two time or less a month * Oral steroids no more than once a year * No hospitalizations   2. Seasonal and perennial allergic rhinitis - Testing on 05/17/22 was positive to: grasses, ragweed, weeds, trees, indoor molds, dust mites, and cockroach. -- Continue Zyrtec (cetirizine) '10mg'$  tablet once daily - You can use an extra dose of the antihistamine, if needed, for breakthrough symptoms.  - Consider nasal saline rinses 1-2 times daily to remove allergens from the nasal cavities as well as help with mucous clearance (this is especially helpful to do before the nasal sprays are given) - Consider allergy shots as a means of long-term control. - Allergy shots "re-train" and "reset" the immune system to ignore environmental allergens and decrease the resulting immune response to those allergens (sneezing, itchy watery eyes, runny nose, nasal congestion, etc).    - Allergy shots improve symptoms in 75-85% of patients.  - We can discuss more at the next appointment if the medications are not working for you.  3. Schedule a follow up appointment in 3 months or sooner if needed      Reducing Pollen Exposure  The American Academy of Allergy, Asthma and Immunology suggests the  following steps to reduce your exposure to pollen during allergy seasons.    Do not hang sheets or clothing out to dry; pollen may collect on these items. Do not mow lawns or spend time around freshly cut grass; mowing stirs up pollen. Keep windows closed at night.  Keep car windows closed while driving. Minimize morning activities outdoors, a time when pollen counts are usually at their highest. Stay indoors as much as possible when pollen counts or humidity is high and on windy days when pollen tends to remain in the air longer. Use air conditioning when possible.  Many air conditioners have filters that trap the pollen spores. Use a HEPA room air filter to remove pollen form the indoor air you breathe.  Control of Mold Allergen   Mold and fungi can grow on a variety of surfaces provided certain temperature and moisture conditions exist.  Outdoor molds grow on plants, decaying vegetation and soil.  The major outdoor mold, Alternaria and Cladosporium, are found in very high numbers during hot and dry conditions.  Generally, a late Summer - Fall peak is seen for common outdoor fungal spores.  Rain will temporarily lower outdoor mold spore count, but counts rise rapidly when the rainy period ends.  The most important indoor molds are Aspergillus and Penicillium.  Dark, humid and poorly ventilated basements are ideal sites for mold growth.  The next most common sites of mold growth are the bathroom and the kitchen.   Indoor (Perennial) Mold Control   Positive indoor molds via skin testing: Fusarium, Aureobasidium (Pullulara), and Rhizopus  Maintain humidity below 50%. Clean washable surfaces with 5% bleach solution. Remove sources e.g. contaminated carpets.    Control of Cockroach Allergen  Cockroach allergen has been identified as an important cause of acute attacks of asthma, especially in urban settings.  There are fifty-five species of cockroach that exist in the [place], however  only three, the [place], [place] species produce allergen that can affect patients with Asthma.  Allergens can be obtained from fecal particles, egg casings and secretions from cockroaches.    Remove food sources. Reduce access to water. Seal access and entry points. Spray runways with 0.5-1% Diazinon or Chlorpyrifos Blow boric acid power under stoves and refrigerator. Place bait stations (hydramethylnon) at feeding sites.  Control of Dust Mite Allergen    Dust mites play a major role in allergic asthma and rhinitis.  They occur in environments with high humidity wherever human skin is found.  Dust mites absorb humidity from the atmosphere (ie, they do not drink) and feed on organic matter (including shed human and animal skin).  Dust mites are a microscopic type of insect that you cannot see with the naked eye.  High levels of dust mites have been detected from mattresses, pillows, carpets, upholstered furniture, bed covers, clothes, soft toys and any woven material.  The principal allergen of the dust mite is found in its feces.  A gram of dust may contain 1,000 mites and 250,000 fecal particles.  Mite antigen is easily measured in the air during house cleaning activities.  Dust mites do not bite and do not cause harm to humans, other than by triggering allergies/asthma.    Ways to decrease your exposure to dust mites in your home:  Encase mattresses, box springs and pillows with a mite-impermeable barrier or cover   Wash sheets, blankets and drapes weekly in hot water (130 F) with detergent and dry them in a dryer on the hot setting.  Have the room cleaned frequently with a vacuum cleaner and a damp dust-mop.  For carpeting or rugs, vacuuming with a vacuum cleaner equipped with a high-efficiency particulate air (HEPA) filter.  The dust mite allergic individual should not be in a room which is being cleaned and should wait 1 hour after cleaning before going into the room. Do not  sleep on upholstered furniture (eg, couches).   If possible removing carpeting, upholstered furniture and drapery from the home is ideal.  Horizontal blinds should be eliminated in the rooms where the person spends the most time (bedroom, study, television room).  Washable vinyl, roller-type shades are optimal. Remove all non-washable stuffed toys from the bedroom.  Wash stuffed toys weekly like sheets  and blankets above.   Reduce indoor humidity to less than 50%.  Inexpensive humidity monitors can be purchased at most hardware stores.  Do not use a humidifier as can make the problem worse and are not recommended.          Return in about 3 months (around 09/07/2022), or if symptoms worsen or fail to improve.    Thank you for the opportunity to care for this patient.  Please do not hesitate to contact me with questions.  Althea Charon, FNP [facility]

## 2022-06-15 DIAGNOSIS — I10 Essential (primary) hypertension: Secondary | ICD-10-CM | POA: Diagnosis not present

## 2022-06-15 DIAGNOSIS — I4891 Unspecified atrial fibrillation: Secondary | ICD-10-CM | POA: Diagnosis not present

## 2022-06-16 ENCOUNTER — Other Ambulatory Visit: Payer: Medicare Other

## 2022-06-16 DIAGNOSIS — R972 Elevated prostate specific antigen [PSA]: Secondary | ICD-10-CM | POA: Diagnosis not present

## 2022-06-17 LAB — PSA: Prostate Specific Ag, Serum: 7.1 ng/mL — ABNORMAL HIGH (ref 0.0–4.0)

## 2022-06-20 ENCOUNTER — Ambulatory Visit: Payer: Medicare Other | Admitting: Urology

## 2022-06-20 ENCOUNTER — Ambulatory Visit: Payer: Medicare Other | Attending: Internal Medicine | Admitting: *Deleted

## 2022-06-20 VITALS — BP 162/79 | HR 78

## 2022-06-20 DIAGNOSIS — N3000 Acute cystitis without hematuria: Secondary | ICD-10-CM

## 2022-06-20 DIAGNOSIS — R972 Elevated prostate specific antigen [PSA]: Secondary | ICD-10-CM

## 2022-06-20 DIAGNOSIS — N401 Enlarged prostate with lower urinary tract symptoms: Secondary | ICD-10-CM | POA: Diagnosis not present

## 2022-06-20 DIAGNOSIS — Z5181 Encounter for therapeutic drug level monitoring: Secondary | ICD-10-CM | POA: Diagnosis not present

## 2022-06-20 DIAGNOSIS — I480 Paroxysmal atrial fibrillation: Secondary | ICD-10-CM | POA: Diagnosis not present

## 2022-06-20 DIAGNOSIS — R351 Nocturia: Secondary | ICD-10-CM | POA: Diagnosis not present

## 2022-06-20 LAB — MICROSCOPIC EXAMINATION
Renal Epithel, UA: NONE SEEN /hpf
WBC, UA: >30 /hpf — AB (ref 0–5)

## 2022-06-20 LAB — URINALYSIS, ROUTINE W REFLEX MICROSCOPIC
Bilirubin, UA: NEGATIVE
Glucose, UA: NEGATIVE
Ketones, UA: NEGATIVE
Nitrite, UA: POSITIVE — AB
Specific Gravity, UA: 1.015 (ref 1.005–1.030)
Urobilinogen, Ur: 0.2 mg/dL (ref 0.2–1.0)
pH, UA: 5 (ref 5.0–7.5)

## 2022-06-20 LAB — POCT INR: INR: 3.5 — AB (ref 2.0–3.0)

## 2022-06-20 NOTE — Patient Instructions (Signed)
Hold warfarin tonight then resume 1 tablet daily Recheck in 6 wks.

## 2022-06-20 NOTE — Progress Notes (Signed)
History of Present Illness: Pt presents for followup of elevated PSA as well as h/o recurrent UTIs.   PSA was 6.3 in late 2020.   2.2.2021--4.2 4.26.2022--5.0 4.25.2023: PSA 7.2 by Dr. Gerarda Fraction  9.5.2023: PSA 7.1.  His PSA in June was 6.3.  At his visit here in April he grew Klebsiella.  This has not been treated.  He currently denies any gross hematuria or dysuria.  IPSS 18 no quality-of-life score filled out.  Prostate volume April visit estimated at 90 mL by DRE. Past Medical History:  Diagnosis Date   Anemia    Anticoagulated on Coumadin    long-term   Arrhythmia    Bleeding disorder ([place])    Chronic kidney disease    frequent uti's last uti few yrs ago   Diastolic CHF, chronic ([facility])    Dysrhythmia    GERD (gastroesophageal reflux disease)    Headache    Heart disease    Hiatal hernia    History of adenomatous polyp of colon    History of esophageal stricture    s/p  dilatations   History of GI bleed    03/ 2003  per EGD gastritis   HOH (hard of hearing)    both ears   Hypertension    Non-rheumatic aortic stenosis    per last echo 12-14-2015     OA (osteoarthritis)    oa   Persistent atrial fibrillation ([place]) first dx  1999   primary cardiologist-  dr Lovena Le-- hx DCCV   Pneumonia 2009   with cpap usage   Shortness of breath    when exertion   Sleep apnea    last sleep study > 5 yrs   no cpap used    Past Surgical History:  Procedure Laterality Date   ABDOMINAL HERNIA REPAIR  1990s   ANTERIOR CERVICAL DECOMP/DISCECTOMY FUSION N/A 02/14/2013   Procedure: ANTERIOR CERVICAL DECOMPRESSION/DISCECTOMY FUSION 2 LEVELS;  Surgeon: Charlie Pitter, MD;  Location: [facility];  Service: Neurosurgery;  Laterality: N/A;   BALLOON DILATION N/A 12/11/2017   Procedure: BALLOON DILATION;  Surgeon: Clarene Essex, MD;  Location: [facility];  Service: Endoscopy;  Laterality: N/A;   COLONOSCOPY  last one 06-03-2012   COLONOSCOPY  06/03/2012   Procedure: COLONOSCOPY;  Surgeon: Daneil Dolin, MD;  Location: [facility];  Service: Endoscopy;  Laterality: N/A;  8:30   esophageal strecching  yrs ago   ESOPHAGOGASTRODUODENOSCOPY (EGD) WITH PROPOFOL N/A 12/11/2017   Procedure: ESOPHAGOGASTRODUODENOSCOPY (EGD) WITH PROPOFOL;  Surgeon: Clarene Essex, MD;  Location: [facility];  Service: Endoscopy;  Laterality: N/A;   ESOPHAGUS SURGERY  20 yrs ago   for food sticking   FOOT SURGERY Right 04-27-2010   dr duda   joint arthrodesis   right foot surgery  2018   to remove screw   TOTAL KNEE ARTHROPLASTY Right 12/07/2017   Procedure: RIGHT TOTAL KNEE ARTHROPLASTY;  Surgeon: Mcarthur Rossetti, MD;  Location: [facility];  Service: Orthopedics;  Laterality: Right;   TRANSTHORACIC ECHOCARDIOGRAM  12-14-2015  dr taylor   moderate LVH, ef 55-60%, indeterminate diastolic function in setting of AFib/  moderate LAE and RAE/  mild to moderate calcific aoric stenosis with mild AR (valve area 1.33cm^2, peak grandient24mHg, mean grandient 131mg)/ trivial MR/ mild TR/ PASP 3360m   VOLVULUS REDUCTION  [id number] Home Medications:  Allergies as of 06/20/2022   No Known Allergies      Medication List  Accurate as of June 20, 2022 11:32 AM. If you have any questions, ask your nurse or doctor.          albuterol 108 (90 Base) MCG/ACT inhaler Commonly known as: Ventolin HFA Inhale 2 puffs into the lungs every 4 (four) hours as needed for wheezing or shortness of breath.   atorvastatin 20 MG tablet Commonly known as: LIPITOR TAKE 1 TABLET BY MOUTH ONCE A DAY.   carvedilol 3.125 MG tablet Commonly known as: COREG TAKE (1) TABLET BY MOUTH TWICE DAILY.   cetirizine 10 MG tablet Commonly known as: ZYRTEC Take 1 tablet (10 mg total) by mouth daily as needed for allergies (Can take an extra dose during flare ups.).   Co-Enzyme Q10 100 MG Caps Take by mouth.   ferrous sulfate 325 (65 FE) MG tablet Take 325 mg by mouth daily with breakfast.   Fish Oil 1000 MG Caps Take  by mouth.   fluticasone 50 MCG/ACT nasal spray Commonly known as: FLONASE Place 1 spray into the nose daily.   furosemide 40 MG tablet Commonly known as: LASIX TAKE (1) TABLET BY MOUTH ONCE DAILY.   HYDROcodone-acetaminophen 5-325 MG tablet Commonly known as: NORCO/VICODIN   losartan 100 MG tablet Commonly known as: COZAAR Take 100 mg by mouth daily.   methocarbamol 500 MG tablet Commonly known as: ROBAXIN Take 1 tablet (500 mg total) by mouth every 6 (six) hours as needed for muscle spasms.   omeprazole 20 MG capsule Commonly known as: PRILOSEC Take 20 mg by mouth at bedtime.   Trelegy Ellipta 100-62.5-25 MCG/ACT Aepb Generic drug: Fluticasone-Umeclidin-Vilant Inhale 1 puff into the lungs daily.   warfarin 5 MG tablet Commonly known as: COUMADIN Take as directed by the anticoagulation clinic. If you are unsure how to take this medication, talk to your nurse or doctor. Original instructions: TAKE (1) TABLET BY MOUTH ONCE DAILY OR AS DIRECTED BY COUMADIN CLINIC.        Allergies: No Known Allergies  Family History  Problem Relation Age of Onset   Allergic rhinitis Mother    Heart disease Mother        Pacemaker   Diabetes Father    Kidney failure Father    Colon cancer Neg Hx    Angioedema Neg Hx    Asthma Neg Hx    Eczema Neg Hx     Social History:  reports that he has never smoked. He has never used smokeless tobacco. He reports that he does not drink alcohol and does not use drugs.  ROS: A complete review of systems was performed.  All systems are negative except for pertinent findings as noted.  Physical Exam:  Vital signs in last 24 hours: There were no vitals taken for this visit. Constitutional:  Alert and oriented, No acute distress Cardiovascular: Regular rate  Respiratory: Normal respiratory effort Neurologic: Grossly intact, no focal deficits Psychiatric: Normal mood and affect  I have reviewed prior pt notes  I spoke with the patient's  daughter who attended the visit today  I have reviewed notes from referring/previous physicians  I have reviewed urinalysis results  I have reviewed prior PSA results  I have reviewed prior urine culture   Impression/Assessment:  1.  BPH, large gland palpably.  Moderate symptoms.  Not currently on any prostate medications  2.  Pyuria, persistent.  In the presence of elevated PSA, I think this should be treated before another PSA drawn  3.  Elevated PSA, increasing trend over the past  2 years.  Large prostate on exam  Plan:  1.  The urine is cultured.  I will hold off on antibiotics at the present time, but once it returns, I will treat with 3 weeks of antibiotics, followed by PSA in 4 weeks  2.  Causative factors of PSA discussed with the patient and his daughter

## 2022-06-22 LAB — URINE CULTURE

## 2022-06-26 ENCOUNTER — Other Ambulatory Visit: Payer: Self-pay | Admitting: Urology

## 2022-06-26 DIAGNOSIS — N3000 Acute cystitis without hematuria: Secondary | ICD-10-CM

## 2022-06-26 MED ORDER — CEPHALEXIN 500 MG PO CAPS: 500.0000 mg | ORAL_CAPSULE | Freq: Two times a day (BID) | ORAL | 0 refills | Status: AC

## 2022-06-27 ENCOUNTER — Telehealth: Payer: Self-pay

## 2022-06-27 ENCOUNTER — Encounter: Payer: Self-pay | Admitting: Gastroenterology

## 2022-06-27 ENCOUNTER — Telehealth: Payer: Self-pay | Admitting: Gastroenterology

## 2022-06-27 NOTE — Telephone Encounter (Signed)
-----   Message from Franchot Gallo, MD sent at 06/26/2022  8:08 PM EDT ----- Let pt know culture came back w/ bacteria--I sent 3 weeks of abx in. ----- Message ----- From: Audie Box, CMA Sent: 06/22/2022   4:01 PM EDT To: Franchot Gallo, MD  Please review, no current antibiotic

## 2022-06-27 NOTE — Telephone Encounter (Signed)
The pt daughter has been advised that she would need to speak with the PCP or urologist in regards to the elevated prostate.  She thanked me for letting her know and will reach out to PCP>

## 2022-06-27 NOTE — Telephone Encounter (Signed)
Inbound call from patient daughter wanting to speak with a nurse in regards to patient being seen for ''Elivated PSA''. Patient is scheduled for ov with a provider on 11/28 at 2:10. Please give a call to further advise.  Thank you

## 2022-06-27 NOTE — Telephone Encounter (Signed)
Patient aware of results and rx at pharmacy.

## 2022-06-28 DIAGNOSIS — Z6836 Body mass index (BMI) 36.0-36.9, adult: Secondary | ICD-10-CM | POA: Diagnosis not present

## 2022-06-28 DIAGNOSIS — N3090 Cystitis, unspecified without hematuria: Secondary | ICD-10-CM | POA: Diagnosis not present

## 2022-06-28 DIAGNOSIS — B961 Klebsiella pneumoniae [K. pneumoniae] as the cause of diseases classified elsewhere: Secondary | ICD-10-CM | POA: Diagnosis not present

## 2022-06-28 DIAGNOSIS — R972 Elevated prostate specific antigen [PSA]: Secondary | ICD-10-CM | POA: Diagnosis not present

## 2022-07-04 DIAGNOSIS — M1991 Primary osteoarthritis, unspecified site: Secondary | ICD-10-CM | POA: Diagnosis not present

## 2022-07-04 DIAGNOSIS — G8929 Other chronic pain: Secondary | ICD-10-CM | POA: Diagnosis not present

## 2022-07-04 DIAGNOSIS — I10 Essential (primary) hypertension: Secondary | ICD-10-CM | POA: Diagnosis not present

## 2022-07-04 DIAGNOSIS — N411 Chronic prostatitis: Secondary | ICD-10-CM | POA: Diagnosis not present

## 2022-07-27 ENCOUNTER — Other Ambulatory Visit: Payer: Self-pay | Admitting: Internal Medicine

## 2022-07-27 DIAGNOSIS — I480 Paroxysmal atrial fibrillation: Secondary | ICD-10-CM

## 2022-08-16 DEATH — deceased

## 2022-09-12 ENCOUNTER — Ambulatory Visit: Payer: Medicare Other | Admitting: Gastroenterology

## 2022-09-13 ENCOUNTER — Ambulatory Visit: Payer: Medicare Other | Admitting: Family
